# Patient Record
Sex: Male | Born: 1970
Health system: Southern US, Community
[De-identification: ages and names within clinical notes are randomized; demographics above are authoritative.]

## PROBLEM LIST (undated history)

## (undated) DIAGNOSIS — I10 Essential (primary) hypertension: Secondary | ICD-10-CM

## (undated) DIAGNOSIS — N289 Disorder of kidney and ureter, unspecified: Secondary | ICD-10-CM

## (undated) DIAGNOSIS — S83209A Unspecified tear of unspecified meniscus, current injury, unspecified knee, initial encounter: Secondary | ICD-10-CM

## (undated) DIAGNOSIS — E291 Testicular hypofunction: Secondary | ICD-10-CM

## (undated) DIAGNOSIS — J45909 Unspecified asthma, uncomplicated: Secondary | ICD-10-CM

## (undated) DIAGNOSIS — G4730 Sleep apnea, unspecified: Secondary | ICD-10-CM

## (undated) DIAGNOSIS — N209 Urinary calculus, unspecified: Secondary | ICD-10-CM

## (undated) DIAGNOSIS — E785 Hyperlipidemia, unspecified: Secondary | ICD-10-CM

## (undated) DIAGNOSIS — E669 Obesity, unspecified: Secondary | ICD-10-CM

## (undated) HISTORY — DX: Testicular hypofunction: E29.1

## (undated) HISTORY — DX: Essential (primary) hypertension: I10

## (undated) HISTORY — DX: Obesity, unspecified: E66.9

## (undated) HISTORY — DX: Sleep apnea, unspecified: G47.30

## (undated) HISTORY — PX: UPPER GASTROINTESTINAL ENDOSCOPY: SHX188

## (undated) HISTORY — DX: Hyperlipidemia, unspecified: E78.5

## (undated) HISTORY — PX: COLOSTOMY: SHX63

---

## 2004-01-03 ENCOUNTER — Emergency Department (HOSPITAL_COMMUNITY): Admission: EM | Admit: 2004-01-03 | Discharge: 2004-01-03 | Payer: Self-pay | Admitting: Emergency Medicine

## 2004-04-06 ENCOUNTER — Emergency Department (HOSPITAL_COMMUNITY): Admission: EM | Admit: 2004-04-06 | Discharge: 2004-04-06 | Payer: Self-pay | Admitting: Emergency Medicine

## 2006-02-11 ENCOUNTER — Encounter: Admission: RE | Admit: 2006-02-11 | Discharge: 2006-02-11 | Payer: Self-pay | Admitting: Family Medicine

## 2006-04-23 ENCOUNTER — Emergency Department (HOSPITAL_COMMUNITY): Admission: EM | Admit: 2006-04-23 | Discharge: 2006-04-23 | Payer: Self-pay | Admitting: Emergency Medicine

## 2006-10-06 ENCOUNTER — Ambulatory Visit: Payer: Self-pay | Admitting: Family Medicine

## 2006-10-10 ENCOUNTER — Ambulatory Visit: Payer: Self-pay | Admitting: Family Medicine

## 2006-11-10 ENCOUNTER — Ambulatory Visit: Payer: Self-pay | Admitting: Family Medicine

## 2006-11-22 ENCOUNTER — Ambulatory Visit (HOSPITAL_BASED_OUTPATIENT_CLINIC_OR_DEPARTMENT_OTHER): Admission: RE | Admit: 2006-11-22 | Discharge: 2006-11-22 | Payer: Self-pay | Admitting: Family Medicine

## 2006-11-25 ENCOUNTER — Ambulatory Visit: Payer: Self-pay | Admitting: Pulmonary Disease

## 2006-12-11 ENCOUNTER — Ambulatory Visit: Payer: Self-pay | Admitting: Family Medicine

## 2007-01-11 ENCOUNTER — Ambulatory Visit: Payer: Self-pay | Admitting: Family Medicine

## 2008-04-01 ENCOUNTER — Ambulatory Visit: Payer: Self-pay | Admitting: Family Medicine

## 2008-04-04 ENCOUNTER — Ambulatory Visit: Payer: Self-pay | Admitting: Family Medicine

## 2008-04-08 ENCOUNTER — Ambulatory Visit: Payer: Self-pay | Admitting: Family Medicine

## 2008-04-14 ENCOUNTER — Ambulatory Visit: Payer: Self-pay | Admitting: Family Medicine

## 2008-04-28 ENCOUNTER — Ambulatory Visit: Payer: Self-pay | Admitting: Family Medicine

## 2008-05-19 ENCOUNTER — Encounter: Admission: RE | Admit: 2008-05-19 | Discharge: 2008-05-19 | Payer: Self-pay | Admitting: Family Medicine

## 2008-06-12 ENCOUNTER — Ambulatory Visit: Payer: Self-pay | Admitting: Family Medicine

## 2008-06-26 ENCOUNTER — Emergency Department (HOSPITAL_COMMUNITY): Admission: EM | Admit: 2008-06-26 | Discharge: 2008-06-26 | Payer: Self-pay | Admitting: Emergency Medicine

## 2008-12-23 ENCOUNTER — Ambulatory Visit: Payer: Self-pay | Admitting: Family Medicine

## 2008-12-25 ENCOUNTER — Ambulatory Visit: Payer: Self-pay | Admitting: Family Medicine

## 2009-01-12 ENCOUNTER — Ambulatory Visit: Payer: Self-pay | Admitting: Family Medicine

## 2009-02-25 ENCOUNTER — Ambulatory Visit: Payer: Self-pay | Admitting: Family Medicine

## 2009-03-02 ENCOUNTER — Emergency Department (HOSPITAL_COMMUNITY): Admission: EM | Admit: 2009-03-02 | Discharge: 2009-03-02 | Payer: Self-pay | Admitting: Emergency Medicine

## 2009-03-03 ENCOUNTER — Ambulatory Visit: Payer: Self-pay | Admitting: Family Medicine

## 2009-03-06 ENCOUNTER — Ambulatory Visit: Payer: Self-pay | Admitting: Family Medicine

## 2009-03-12 ENCOUNTER — Ambulatory Visit: Payer: Self-pay | Admitting: Family Medicine

## 2009-03-17 ENCOUNTER — Ambulatory Visit: Payer: Self-pay | Admitting: Family Medicine

## 2009-03-23 ENCOUNTER — Ambulatory Visit: Payer: Self-pay | Admitting: Family Medicine

## 2009-06-26 ENCOUNTER — Ambulatory Visit: Payer: Self-pay | Admitting: Family Medicine

## 2009-07-29 ENCOUNTER — Ambulatory Visit: Payer: Self-pay | Admitting: Family Medicine

## 2010-07-05 ENCOUNTER — Ambulatory Visit (INDEPENDENT_AMBULATORY_CARE_PROVIDER_SITE_OTHER): Payer: 59 | Admitting: Family Medicine

## 2010-07-05 DIAGNOSIS — E785 Hyperlipidemia, unspecified: Secondary | ICD-10-CM

## 2010-07-05 DIAGNOSIS — E291 Testicular hypofunction: Secondary | ICD-10-CM

## 2010-07-05 DIAGNOSIS — E119 Type 2 diabetes mellitus without complications: Secondary | ICD-10-CM

## 2010-07-05 DIAGNOSIS — I10 Essential (primary) hypertension: Secondary | ICD-10-CM

## 2010-08-20 ENCOUNTER — Encounter: Payer: Self-pay | Admitting: Family Medicine

## 2010-08-25 ENCOUNTER — Encounter: Payer: Self-pay | Admitting: Family Medicine

## 2010-09-07 ENCOUNTER — Encounter: Payer: Self-pay | Admitting: Family Medicine

## 2010-09-07 LAB — POCT CARDIAC MARKERS: Myoglobin, poc: 99.3 ng/mL (ref 12–200)

## 2010-10-04 ENCOUNTER — Ambulatory Visit: Payer: 59 | Admitting: Family Medicine

## 2010-10-05 NOTE — Procedures (Signed)
NAME:  [name], [name]                ACCOUNT NO.:  [id number]   MEDICAL RECORD NO.:  [id number]          PATIENT TYPE:  OUT   LOCATION:  SLEEP CENTER                 FACILITY:  [facility]   PHYSICIAN:  Barbaraann Share, MD,FCCPDATE OF BIRTH:  02-Jul-1970   DATE OF STUDY:  11/22/2006                            NOCTURNAL POLYSOMNOGRAM   REFERRING PHYSICIAN:  Sharlot Gowda, M.D.   INDICATION FOR STUDY:  Hypersomnia with sleep apnea.   EPWORTH SLEEPINESS SCORE:  10   MEDICATIONS:   SLEEP ARCHITECTURE:  Patient had total sleep time of 327 minutes with  decreased slow-wave sleep, as well as REM sleep.  Onset latency was very  rapid at 2 minutes and REM onset was prolonged at 207 minutes.  Sleep  efficiency was adequate at 92%.   RESPIRATORY DATA:  Patient underwent split-night protocol, where he was  found to have 58 obstructive events in the first 125 minutes of sleep.  This gave him an apnea/hypopnea index of 28 events per hour over that  time period.  There was very loud snoring noted prior to C-PAP  initiation.  By protocol, the patient was then placed on a large Quattro  full face mask and C-PAP titration was initiated.  At a pressure of  approximately 15 cm of water, there seemed to be adequate control of his  obstructive events, although a few break-through events did occur after  this pressure.   OXYGEN DATA:  There was O2 desaturation as low as 85% with the patient's  obstructive events, prior to optimal C-PAP being reached.   CARDIAC DATA:  No clinically significant cardiac arrhythmias were noted.   MOVEMENT-PARASOMNIA:  The patient was found to have 47 leg jerks with  1.3 per hour, resulting in arousal or awakening.  This is insignificant.   IMPRESSIONS-RECOMMENDATIONS:  Split-night study reveals moderate  obstructive sleep apnea/hypopnea syndrome with an apnea/hypopnea index  of 28 events per hour and O2 desaturation as low as 85% over the first  part of the night.  Patient was  then placed on a large Quattro full face  mask and had adequate control of most of his events on a final pressure  of 15 cm of water.  There were a few break-through events on 15 and,  therefore, his pressure was titrated higher; however, I would start at  the 15cm and monitor the patient for break-through events  while at home and to see if he returns to a well-functioning baseline.  The patient should also be encouraged to work on aggressive weight-loss  while being treated with C-PAP.      Barbaraann Share, MD,FCCP  Diplomate, American Board of Sleep  Medicine  Electronically Signed     [facility]/MEDQ  D:  11/28/2006 17:04:51  T:  11/29/2006 09:54:47  Job:  [id number]

## 2011-06-27 ENCOUNTER — Other Ambulatory Visit: Payer: Self-pay | Admitting: Family Medicine

## 2011-07-26 ENCOUNTER — Ambulatory Visit (INDEPENDENT_AMBULATORY_CARE_PROVIDER_SITE_OTHER): Payer: 59 | Admitting: Family Medicine

## 2011-07-26 ENCOUNTER — Encounter: Payer: Self-pay | Admitting: Family Medicine

## 2011-07-26 DIAGNOSIS — J302 Other seasonal allergic rhinitis: Secondary | ICD-10-CM | POA: Insufficient documentation

## 2011-07-26 DIAGNOSIS — I10 Essential (primary) hypertension: Secondary | ICD-10-CM

## 2011-07-26 DIAGNOSIS — E669 Obesity, unspecified: Secondary | ICD-10-CM

## 2011-07-26 DIAGNOSIS — E785 Hyperlipidemia, unspecified: Secondary | ICD-10-CM

## 2011-07-26 DIAGNOSIS — E291 Testicular hypofunction: Secondary | ICD-10-CM | POA: Insufficient documentation

## 2011-07-26 DIAGNOSIS — Z0000 Encounter for general adult medical examination without abnormal findings: Secondary | ICD-10-CM

## 2011-07-26 DIAGNOSIS — E1169 Type 2 diabetes mellitus with other specified complication: Secondary | ICD-10-CM | POA: Insufficient documentation

## 2011-07-26 DIAGNOSIS — I152 Hypertension secondary to endocrine disorders: Secondary | ICD-10-CM | POA: Insufficient documentation

## 2011-07-26 DIAGNOSIS — E119 Type 2 diabetes mellitus without complications: Secondary | ICD-10-CM

## 2011-07-26 DIAGNOSIS — E118 Type 2 diabetes mellitus with unspecified complications: Secondary | ICD-10-CM | POA: Insufficient documentation

## 2011-07-26 DIAGNOSIS — Z23 Encounter for immunization: Secondary | ICD-10-CM

## 2011-07-26 LAB — CBC WITH DIFFERENTIAL/PLATELET
Basophils Absolute: 0 10*3/uL (ref 0.0–0.1)
Basophils Relative: 1 % (ref 0–1)
Eosinophils Absolute: 0.2 10*3/uL (ref 0.0–0.7)
MCH: 29 pg (ref 26.0–34.0)
MCHC: 33.8 g/dL (ref 30.0–36.0)
MCV: 85.9 fL (ref 78.0–100.0)
Neutro Abs: 3.6 10*3/uL (ref 1.7–7.7)
Neutrophils Relative %: 62 % (ref 43–77)
RDW: 12.9 % (ref 11.5–15.5)
WBC: 5.9 10*3/uL (ref 4.0–10.5)

## 2011-07-26 LAB — LIPID PANEL
Cholesterol: 194 mg/dL (ref 0–200)
HDL: 48 mg/dL (ref >39)
LDL Cholesterol: 132 mg/dL — ABNORMAL HIGH (ref 0–99)
Total CHOL/HDL Ratio: 4 Ratio
Triglycerides: 70 mg/dL (ref <150)
VLDL: 14 mg/dL (ref 0–40)

## 2011-07-26 LAB — COMPREHENSIVE METABOLIC PANEL
ALT: 15 U/L (ref 0–53)
CO2: 24 mEq/L (ref 19–32)
Potassium: 4.3 mEq/L (ref 3.5–5.3)
Sodium: 136 mEq/L (ref 135–145)
Total Bilirubin: 0.5 mg/dL (ref 0.3–1.2)
Total Protein: 7.3 g/dL (ref 6.0–8.3)

## 2011-07-26 MED ORDER — LOSARTAN POTASSIUM-HCTZ 100-12.5 MG PO TABS: 1.0000 | ORAL_TABLET | Freq: Every day | ORAL | Status: DC

## 2011-07-26 MED ORDER — AMLODIPINE BESYLATE 10 MG PO TABS: 10.0000 mg | ORAL_TABLET | Freq: Every day | ORAL | Status: DC

## 2011-07-26 MED ORDER — PIOGLITAZONE HCL-METFORMIN HCL 15-500 MG PO TABS: 1.0000 | ORAL_TABLET | Freq: Two times a day (BID) | ORAL | Status: DC

## 2011-07-26 MED ORDER — TESTOSTERONE 12.5 MG/ACT (1%) TD GEL: 4.0000 "application " | Freq: Two times a day (BID) | TRANSDERMAL | Status: DC

## 2011-07-26 NOTE — Progress Notes (Signed)
Subjective:    Patient ID: Guy Taylor, male    DOB: 03/13/1971, 41 y.o.   MRN: [id number]  HPI He is here for a complete examination.  Apparently the medication was causing difficulty with headaches he stopped taking his blood pressure medication. Did not call to let him know this. He also started followup on his diabetes although he states he is taking his Actos. He has not increase his physical activity stating work is kept him from doing this. He states he has made some dietary changes. He has no other concerns or complaints. His work and marriage are going well.   Review of Systems  Constitutional: Negative.   HENT: Negative.   Eyes: Negative.   Respiratory: Negative.   Cardiovascular: Negative.   Gastrointestinal: Negative.   Genitourinary: Negative.   Musculoskeletal: Negative.   Skin: Negative.   Neurological: Negative.   Hematological: Negative.   Psychiatric/Behavioral: Negative.        Objective:   Physical Exam BP 204/124  Pulse 81  Wt 376 lb (170.552 kg)  General Appearance:    Alert, cooperative, no distress, appears stated age  Head:    Normocephalic, without obvious abnormality, atraumatic  Eyes:    PERRL, conjunctiva/corneas clear, EOM's intact, fundi    benign  Ears:    Normal TM's and external ear canals  Nose:   Nares normal, mucosa normal, no drainage or sinus   tenderness  Throat:   Lips, mucosa, and tongue normal; teeth and gums normal  Neck:   Supple, no lymphadenopathy;  thyroid:  no   enlargement/tenderness/nodules; no carotid   bruit or JVD  Back:    Spine nontender, no curvature, ROM normal, no CVA     tenderness  Lungs:     Clear to auscultation bilaterally without wheezes, rales or     ronchi; respirations unlabored  Chest Wall:    No tenderness or deformity   Heart:    Regular rate and rhythm, S1 and S2 normal, no murmur, rub   or gallop  Breast Exam:    No chest wall tenderness, masses or gynecomastia  Abdomen:     Soft, non-tender,  nondistended, normoactive bowel sounds,    no masses, no hepatosplenomegaly  Genitalia:    Normal male external genitalia without lesions.  Testicles without masses.  No inguinal hernias.  Rectal:    Normal sphincter tone, no masses or tenderness; guaiac negative stool.  Prostate smooth, no nodules, not enlarged.  Extremities:   No clubbing, cyanosis or edema  Pulses:   2+ and symmetric all extremities  Skin:   Skin color, texture, turgor normal, no rashes or lesions  Lymph nodes:   Cervical, supraclavicular, and axillary nodes normal  Neurologic:   CNII-XII intact, normal strength, sensation and gait; reflexes 2+ and symmetric throughout          Psych:   Normal mood, affect, hygiene and grooming.           Assessment & Plan:   1. Diabetes mellitus  Amb ref to Medical Nutrition Therapy-MNT, pioglitazone-metformin (ACTOPLUS MET) 15-500 MG per tablet, CBC with Differential, Comprehensive metabolic panel, POCT UA - Microalbumin, Lipid panel  2. Accelerated hypertension  PR ELECTROCARDIOGRAM, COMPLETE, losartan-hydrochlorothiazide (HYZAAR) 100-12.5 MG per tablet, amLODipine (NORVASC) 10 MG tablet, CBC with Differential, Comprehensive metabolic panel  3. Obesity (BMI 35.0-39.9 without comorbidity)  CBC with Differential, Comprehensive metabolic panel  4. Hyperlipidemia LDL goal <70  Lipid panel  5. Hypogonadism male  Testosterone (ANDROGEL PUMP) 1.25  GM/ACT (1%) GEL, PSA  6. Allergic rhinitis, seasonal    7. Routine general medical examination at a health care facility  Tdap vaccine greater than or equal to 7yo IM, Hemoccult - 1 Card (office)  Start back on all your medications. Recheck your blood pressure here in one week. Discussed the need for him that diet and exercise changes. Also discussed the need for him to call me if he has difficulty with his medications rather than just stop them. He is to return here in one week for recheck.

## 2011-07-26 NOTE — Patient Instructions (Signed)
Start back on all your medications. Recheck your blood pressure here in one week.

## 2011-07-27 NOTE — Progress Notes (Signed)
Quick Note:  The blood work is normal ______ 

## 2011-08-03 ENCOUNTER — Other Ambulatory Visit: Payer: 59

## 2011-09-13 ENCOUNTER — Encounter: Payer: Self-pay | Admitting: *Deleted

## 2011-09-13 ENCOUNTER — Encounter: Payer: 59 | Attending: Family Medicine | Admitting: *Deleted

## 2011-09-13 DIAGNOSIS — Z713 Dietary counseling and surveillance: Secondary | ICD-10-CM | POA: Insufficient documentation

## 2011-09-13 DIAGNOSIS — E669 Obesity, unspecified: Secondary | ICD-10-CM | POA: Insufficient documentation

## 2011-09-13 DIAGNOSIS — E119 Type 2 diabetes mellitus without complications: Secondary | ICD-10-CM | POA: Insufficient documentation

## 2011-09-13 NOTE — Patient Instructions (Addendum)
Plan: Aim for 4 Carb choices (60 grams) per meal +/- 1 either way with current activity level Aim for 5  Carb choices (75 grams) per meal +/- 1 either way with increased activity level Investigate options for joining a basket ball team that practices and plays at least 3 times a week

## 2011-09-13 NOTE — Progress Notes (Signed)
Medical Nutrition Therapy:  Appt start time: 0900 end time:  1000.  Assessment:  Primary concerns today: patient here for assistance with weight loss and for diabetes education. He states history of diabetes for 4 years and no previous diabetes education. He was physically active in college with football and basketball, but is not currently playing either. 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    MEDICATIONS: see list. Diabetes medication is (ACTOPLUS MET) 15-500 MG per tablet   DIETARY INTAKE:  Usual eating pattern includes 2-3 meals and 2 snacks per day.  Everyday foods include fair variety of all food groups.  Avoided foods include regular soda and sweets.    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   Nutritional Diagnosis:  NI-1.5 Excessive energy intake As related to activity level.  As evidenced by BMI of 45.9 % .    Intervention:  Nutrition counseling and diabetes education provided. Discusses basic physiology of diabetes, self-monitoring of BG, HgA1c, Carb Counting and calorie value of macro-nutrients for weight loss success. Also encouraged increased activity through sports he enjoys including basketball.  Plan: Aim for 4 Carb choices (60 grams) per meal +/- 1 either way with current activity  level Aim for 5  Carb choices (75 grams) per meal +/- 1 either way with increased activity level Investigate options for joining a basket ball team that practices and plays at least 3 times a week  Handouts given during visit include: Living Well with Diabetes Carb Counting and Food Label handouts Meal Plan Card  Monitoring/Evaluation:  Dietary intake, exercise, self monitoring of BG, and body weight in 6 week(s).

## 2011-10-25 ENCOUNTER — Ambulatory Visit: Payer: 59 | Admitting: *Deleted

## 2011-10-31 ENCOUNTER — Ambulatory Visit: Payer: 59 | Admitting: *Deleted

## 2011-11-07 ENCOUNTER — Other Ambulatory Visit: Payer: Self-pay

## 2011-11-07 ENCOUNTER — Ambulatory Visit (INDEPENDENT_AMBULATORY_CARE_PROVIDER_SITE_OTHER): Payer: 59 | Admitting: Family Medicine

## 2011-11-07 ENCOUNTER — Encounter: Payer: Self-pay | Admitting: Family Medicine

## 2011-11-07 VITALS — BP 170/110 | HR 78 | Wt 387.0 lb

## 2011-11-07 DIAGNOSIS — M79609 Pain in unspecified limb: Secondary | ICD-10-CM

## 2011-11-07 DIAGNOSIS — E291 Testicular hypofunction: Secondary | ICD-10-CM

## 2011-11-07 DIAGNOSIS — M79672 Pain in left foot: Secondary | ICD-10-CM

## 2011-11-07 MED ORDER — TESTOSTERONE 12.5 MG/ACT (1%) TD GEL: 4.0000 "application " | Freq: Two times a day (BID) | TRANSDERMAL | Status: DC

## 2011-11-07 NOTE — Progress Notes (Signed)
  Subjective:    Patient ID: Guy Taylor, male    DOB: 1971/01/31, 41 y.o.   MRN: [id number]  HPI A week ago while playing basketball, someone landed on his left foot causing pain mainly in the fourth toe. She continues to have pain. He also states that he ran out of his AndroGel.  Review of Systems     Objective:   Physical Exam Exam of the left foot shows no swelling or deformity with minimal tenderness to palpation over the fourth MTP joint. Good motion of the joint. No other palpable tenderness. X-ray is negative      Assessment & Plan:  Left foot contusion. Supportive care Hypogonadism. His testosterone will be renewed and he will return here in one month for a recheck .

## 2011-11-07 NOTE — Telephone Encounter (Signed)
Called testosterone in per jcl 

## 2011-11-22 ENCOUNTER — Encounter: Payer: 59 | Attending: Family Medicine | Admitting: *Deleted

## 2011-11-22 DIAGNOSIS — Z713 Dietary counseling and surveillance: Secondary | ICD-10-CM | POA: Insufficient documentation

## 2011-11-22 DIAGNOSIS — E669 Obesity, unspecified: Secondary | ICD-10-CM

## 2011-11-22 DIAGNOSIS — E119 Type 2 diabetes mellitus without complications: Secondary | ICD-10-CM | POA: Insufficient documentation

## 2011-11-22 NOTE — Progress Notes (Signed)
Medical Nutrition Therapy:  Appt start time: 0900 end time:  1000.  Assessment:  Primary concerns today: patient here for follow up visit for diabetes and weight loss. He traveled to [place] to be with family since last visit and ate more than usual, exercised less. Weight today indicates stability, no gain or loss since last visit. He has started playing basket ball with friends and anticipates his work hours to decrease in another month. He is testing his BG BID and reports range of 89-154 mg/dl. Feels comfortable with Carb Counting and states he is aiming for 4 Carb Choices per meal.  MEDICATIONS: see list. Diabetes medication is (ACTOPLUS MET) 15-500 MG per tablet   DIETARY INTAKE:  Usual eating pattern includes 2-3 meals and 2 snacks per day.  Everyday foods include fair variety of all food groups.  Avoided foods include regular soda and sweets.    24-hr recall:  B ( AM): on work days: sausage biscuit x 1, water, otherwise no breakfast  Snk ( AM): crackers x 2 pkgs from vending machine  L ( PM): cafeteria at work: 1 wrap, chips, water Snk ( PM): same as AM D ( PM): home: wife cooks - meat x 2-3 pieces, vegetables, occasionally bread, diluted fruit juice x 8 oz Snk ( PM): none Beverages: water, diluted fruit juice, occasional regular [employer] (2/month)  Usual physical activity: has started playing some basketball and currently tries to walk briskly 2 miles on days off as well as at work  Estimated energy needs: 1800 calories 200 g carbohydrates 135 g protein 50 g fat  Progress Towards Goal(s):  In progress.   Nutritional Diagnosis:  NI-1.5 Excessive energy intake As related to activity level.  As evidenced by BMI of 45.9 % .    Intervention: Complemented him on his successes so far, encouraged him to continue with healthier food choices and increase his activity level.  Plan: Continue to aim for 4 Carb choices (60 grams) per meal +/- 1 either way with current activity  level Continue to aim for 5  Carb choices (75 grams) per meal +/- 1 either way with increased activity level Continue to play basket ball and playing at least 2-3 times a week Continue to test your BG twice daily as directed by your MD  Monitoring/Evaluation:  Dietary intake, exercise, self monitoring of BG, and body weight PRN.

## 2011-11-28 ENCOUNTER — Encounter: Payer: Self-pay | Admitting: *Deleted

## 2011-11-28 NOTE — Patient Instructions (Signed)
Plan: Continue to aim for 4 Carb choices (60 grams) per meal +/- 1 either way with current activity level Continue to aim for 5  Carb choices (75 grams) per meal +/- 1 either way with increased activity level Continue to play basket ball and playing at least 2-3 times a week Continue to test your BG twice daily as directed by your MD

## 2011-12-12 ENCOUNTER — Telehealth: Payer: Self-pay | Admitting: Internal Medicine

## 2011-12-12 ENCOUNTER — Other Ambulatory Visit: Payer: Self-pay

## 2011-12-12 ENCOUNTER — Encounter: Payer: Self-pay | Admitting: Family Medicine

## 2011-12-12 ENCOUNTER — Ambulatory Visit (INDEPENDENT_AMBULATORY_CARE_PROVIDER_SITE_OTHER): Payer: 59 | Admitting: Family Medicine

## 2011-12-12 VITALS — BP 140/90 | HR 79 | Wt 392.0 lb

## 2011-12-12 DIAGNOSIS — N529 Male erectile dysfunction, unspecified: Secondary | ICD-10-CM

## 2011-12-12 DIAGNOSIS — E291 Testicular hypofunction: Secondary | ICD-10-CM

## 2011-12-12 DIAGNOSIS — E669 Obesity, unspecified: Secondary | ICD-10-CM

## 2011-12-12 DIAGNOSIS — E119 Type 2 diabetes mellitus without complications: Secondary | ICD-10-CM

## 2011-12-12 MED ORDER — TESTOSTERONE 20.25 MG/1.25GM (1.62%) TD GEL: 4.0000 | Freq: Every day | TRANSDERMAL | Status: DC

## 2011-12-12 MED ORDER — TADALAFIL 20 MG PO TABS: 20.0000 mg | ORAL_TABLET | Freq: Every day | ORAL | Status: DC | PRN

## 2011-12-12 NOTE — Progress Notes (Signed)
  Subjective:    Patient ID: Guy Taylor, male    DOB: [birth year], 41 y.o.   MRN: [id number]  HPI He is here for a checkup. He states that his foot is now back to normal. He did not get his testosterone filled. He states that the pharmacy said that it did not have the prescription. He states that he is checking his blood sugars and they are now running under 120. He has been checking his blood pressure and this is also been good. He works 6 days a week and apparently is quite active at work with walking. In spite of this his weight has gone up. He also is had some difficulty with erectile dysfunction. He has tried Cialis in the past with mixed results.   Review of Systems     Objective:   Physical Exam Alert and in no distress otherwise not examined.       Assessment & Plan:   1. Diabetes mellitus    2. Hypogonadism male    3. Obesity (BMI 35.0-39.9 without comorbidity)    4. ED (erectile dysfunction)  tadalafil (CIALIS) 20 MG tablet   I encouraged him to look at his eating habits and instead of eating only twice per day, increases to more frequent smaller meals. Also encouraged him to increase his physical activity. I will renew his testosterone. Also given Cialis. I would like to see if the testosterone has an additive effect on his erectile issue. 25 minutes spent discussing all these issues with him.

## 2011-12-12 NOTE — Telephone Encounter (Signed)
Called pt and asked him to call his ins to see what was covered and let us know

## 2011-12-12 NOTE — Telephone Encounter (Signed)
Called androgel in per jcl 

## 2011-12-12 NOTE — Telephone Encounter (Signed)
Find out what is covered by his insurance and called it in to have him return for followup in one month

## 2011-12-12 NOTE — Patient Instructions (Signed)
Let me know how the Cialis works. Check with your pharmacy later today and if they do not have the testosterone, stay on top of this and to let it slide another month

## 2011-12-17 ENCOUNTER — Emergency Department (HOSPITAL_COMMUNITY)
Admission: EM | Admit: 2011-12-17 | Discharge: 2011-12-17 | Disposition: A | Discharge status: Home / Self Care | Payer: 59 | Source: Home / Self Care | Attending: Emergency Medicine | Admitting: Emergency Medicine

## 2011-12-17 ENCOUNTER — Encounter (HOSPITAL_COMMUNITY): Payer: Self-pay | Admitting: Cardiology

## 2011-12-17 DIAGNOSIS — M79672 Pain in left foot: Secondary | ICD-10-CM

## 2011-12-17 DIAGNOSIS — M79609 Pain in unspecified limb: Secondary | ICD-10-CM

## 2011-12-17 HISTORY — DX: Unspecified asthma, uncomplicated: J45.909

## 2011-12-17 MED ORDER — IBUPROFEN 800 MG PO TABS: 800.0000 mg | ORAL_TABLET | Freq: Three times a day (TID) | ORAL | Status: AC

## 2011-12-17 NOTE — ED Provider Notes (Signed)
History     CSN: [id number]  Arrival date & time 12/17/11  1228   First MD Initiated Contact with Patient 12/17/11 1241      Chief Complaint  Patient presents with  . 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 iT HURTS SPECIFICALLY IN HIS LEFT HEEL area when he walks on it. Denies any skin disruption, numbness or tingling sensation or weakness.  Patient is a 41 y.o. male presenting with leg pain. The history is provided by the patient.  Leg Pain  The incident occurred 1 to 2 hours ago. The incident occurred at work. There was no injury mechanism. Pain location: Left heel. The pain is at a severity of 4/10. Pertinent negatives include no numbness, no muscle weakness, no loss of sensation and no tingling.    Past Medical History  Diagnosis Date  . Hypertension   . Obesity   . Hypogonadism male   . Allergic rhinitis   . Diabetes mellitus   . Asthma     History reviewed. No pertinent past surgical history.  Family History  Problem Relation Age of Onset  . Diabetes Mother     History  Substance Use Topics  . Smoking status: Never Smoker   . Smokeless tobacco: Never Used  . Alcohol Use: No      Review of Systems  Constitutional: Positive for activity change and appetite change. Negative for fever and fatigue.  Skin: Negative for color change, pallor, rash and wound.  Neurological: Negative for tingling, weakness and numbness.    Allergies  Lisinopril  Home Medications   Current Outpatient Rx  Name Route Sig Dispense Refill  . AMLODIPINE BESYLATE 10 MG PO TABS Oral Take 1 tablet (10 mg total) by mouth daily. 30 tablet 11  . LOSARTAN POTASSIUM-HCTZ 100-12.5 MG PO TABS Oral Take 1 tablet by mouth daily. 90  tablet 3  . PIOGLITAZONE HCL-METFORMIN HCL 15-500 MG PO TABS Oral Take 1 tablet by mouth 2 (two) times daily with a meal. 60 tablet 5    Pt must have diabetes check  . ROSUVASTATIN CALCIUM 20 MG PO TABS Oral Take 20 mg by mouth daily.      . TESTOSTERONE 20.25 MG/1.25GM (1.62%) TD GEL Transdermal Place 4 Squirts onto the skin daily. 150 g 5  . IBUPROFEN 800 MG PO TABS Oral Take 1 tablet (800 mg total) by mouth 3 (three) times daily. 21 tablet 0  . TADALAFIL 20 MG PO TABS Oral Take 1 tablet (20 mg total) by mouth daily as needed for erectile dysfunction. 6 tablet 11    BP 138/88  Pulse 87  Temp 97.9 F (36.6 C) (Oral)  Resp 16  SpO2 97%  Physical Exam  Nursing note and vitals reviewed. Constitutional: He appears well-developed. No distress.  Musculoskeletal: He exhibits tenderness. He exhibits no edema.       Left ankle: He exhibits normal range of motion, no swelling, no ecchymosis, no deformity, no laceration and normal pulse. tenderness. No lateral malleolus and no medial malleolus tenderness found. Achilles tendon normal.       Feet:  Neurological: He is alert. He exhibits normal muscle tone.  Skin: No rash noted. No erythema.    ED Course  Procedures (including critical care time)  Labs Reviewed -  No data to display No results found.   1. Pain of left heel       MDM   Possibly focal fasciitis in the area of his left heel. Nontraumatic. Patient instructed to use a shoe insert and he is an NSAID course as necessary.       Jimmie Molly, MD 12/17/11 [phone]

## 2011-12-17 NOTE — ED Notes (Signed)
Pt reports he was at work today and at approx 10am had pain shoot from heel through the back of his calf. Denies injury. Pain is gone when bearing weight on the foot. Denies calf tenderness. Denies fever.

## 2011-12-20 ENCOUNTER — Other Ambulatory Visit: Payer: Self-pay

## 2011-12-20 NOTE — Telephone Encounter (Signed)
Pt was asked to call ins. Company to find out what testosterone med they will cover he called me today left message I couldn't understand something about generic to please call that in I called left message for pt I didn't understand the message to please call me back and let me know the generic name

## 2011-12-28 ENCOUNTER — Other Ambulatory Visit: Payer: Self-pay

## 2011-12-28 MED ORDER — TESTOSTERONE 50 MG/5GM (1%) TD GEL: 5.0000 g | Freq: Every day | TRANSDERMAL | Status: DC

## 2011-12-28 NOTE — Telephone Encounter (Signed)
Pt called said the testosterone med his ins will pay for is anoderm or,testim please advise on what you want called in

## 2011-12-28 NOTE — Telephone Encounter (Signed)
Called testim in 1 tube qd recheck in 1 mth left message on cell # that testim was called in and discount card up front

## 2011-12-28 NOTE — Telephone Encounter (Signed)
Call in Testim one tube per day and recheck here one month.

## 2012-01-02 ENCOUNTER — Encounter: Payer: Self-pay | Admitting: Family Medicine

## 2012-01-02 ENCOUNTER — Ambulatory Visit (INDEPENDENT_AMBULATORY_CARE_PROVIDER_SITE_OTHER): Payer: 59 | Admitting: Family Medicine

## 2012-01-02 VITALS — BP 140/90 | HR 80

## 2012-01-02 DIAGNOSIS — M7050 Other bursitis of knee, unspecified knee: Secondary | ICD-10-CM

## 2012-01-02 DIAGNOSIS — IMO0002 Reserved for concepts with insufficient information to code with codable children: Secondary | ICD-10-CM

## 2012-01-02 NOTE — Patient Instructions (Signed)
Heat for 20 minutes 3 times per day. Use Advil or Aleve regularly for the next week or so to help with pain if you need

## 2012-01-02 NOTE — Progress Notes (Signed)
  Subjective:    Patient ID: Guy Taylor, male    DOB: [birth year], 41 y.o.   MRN: [id number]  HPI He has a two-week history of right knee aching. In the last the pain has gotten much worse and is localized to the medial aspect of his knee. No history of recent injury, popping, locking or grinding.  Review of Systems     Objective:   Physical Exam Exam of the right knee shows tenderness palpation over the pes anserine bursa. No joint effusion noted. Ligaments intact. No joint line tenderness.       Assessment & Plan:   1. Pes anserine bursitis    recommend heat, anti-inflammatory and conservative care. I reassured him that this was not dangerous.

## 2012-01-16 ENCOUNTER — Other Ambulatory Visit: Payer: Self-pay

## 2012-01-16 ENCOUNTER — Encounter: Payer: Self-pay | Admitting: Family Medicine

## 2012-01-16 ENCOUNTER — Ambulatory Visit (INDEPENDENT_AMBULATORY_CARE_PROVIDER_SITE_OTHER): Payer: 59 | Admitting: Family Medicine

## 2012-01-16 VITALS — BP 160/100 | HR 80 | Wt 392.0 lb

## 2012-01-16 DIAGNOSIS — M25561 Pain in right knee: Secondary | ICD-10-CM

## 2012-01-16 DIAGNOSIS — M25569 Pain in unspecified knee: Secondary | ICD-10-CM

## 2012-01-16 DIAGNOSIS — R52 Pain, unspecified: Secondary | ICD-10-CM

## 2012-01-16 NOTE — Progress Notes (Signed)
  Subjective:    Patient ID: Guy Taylor, male    DOB: Aug 01, 1970, 41 y.o.   MRN: [id number]  HPI He is here for recheck. He has had knee pain now for one month. He now is having some lateral knee pain as well as medial knee pain in the same area as the last visit. He also complains of popping sensation but no grinding or locking. He has been on anti-inflammatories for the last several weeks.   Review of Systems     Objective:   Physical Exam Pain on motion of the knee. No effusion noted. Tender to palpation along the lateral joint line with positive McMurray's. Anterior drawer caused discomfort. Tender to palpation over the pes anserine bursa.       Assessment & Plan:   1. Right knee pain  MR Knee Left  Wo Contrast   case had to be discussed with physician from [place] [employer] to get approval

## 2012-01-20 ENCOUNTER — Ambulatory Visit
Admission: RE | Admit: 2012-01-20 | Discharge: 2012-01-20 | Disposition: A | Discharge status: Home / Self Care | Payer: 59 | Source: Ambulatory Visit | Attending: Family Medicine | Admitting: Family Medicine

## 2012-01-20 DIAGNOSIS — R52 Pain, unspecified: Secondary | ICD-10-CM

## 2012-01-24 NOTE — Progress Notes (Signed)
Faxed all info over to [facility]

## 2012-01-24 NOTE — Progress Notes (Signed)
LEFT MESSAGE FOR PT TO CALL ME BACK TO SEE WHAT ORTHO HE WOULD WANT TO SEE

## 2012-02-01 ENCOUNTER — Other Ambulatory Visit: Payer: Self-pay | Admitting: Orthopedic Surgery

## 2012-02-06 ENCOUNTER — Encounter (HOSPITAL_COMMUNITY): Payer: Self-pay

## 2012-02-07 ENCOUNTER — Encounter (HOSPITAL_COMMUNITY)
Admission: RE | Admit: 2012-02-07 | Discharge: 2012-02-07 | Disposition: A | Discharge status: Home / Self Care | Payer: 59 | Source: Ambulatory Visit | Attending: Orthopedic Surgery | Admitting: Orthopedic Surgery

## 2012-02-07 ENCOUNTER — Encounter (HOSPITAL_COMMUNITY): Payer: Self-pay

## 2012-02-07 HISTORY — DX: Unspecified tear of unspecified meniscus, current injury, unspecified knee, initial encounter: S83.209A

## 2012-02-07 HISTORY — DX: Urinary calculus, unspecified: N20.9

## 2012-02-07 LAB — CBC
Hemoglobin: 15.4 g/dL (ref 13.0–17.0)
MCH: 29.1 pg (ref 26.0–34.0)
MCHC: 33.8 g/dL (ref 30.0–36.0)
MCV: 85.8 fL (ref 78.0–100.0)
RBC: 5.3 MIL/uL (ref 4.22–5.81)

## 2012-02-07 LAB — BASIC METABOLIC PANEL
BUN: 16 mg/dL (ref 6–23)
CO2: 26 mEq/L (ref 19–32)
Calcium: 9.8 mg/dL (ref 8.4–10.5)
GFR calc non Af Amer: 72 mL/min — ABNORMAL LOW (ref >90)
Glucose, Bld: 149 mg/dL — ABNORMAL HIGH (ref 70–99)
Potassium: 3.9 mEq/L (ref 3.5–5.1)

## 2012-02-07 NOTE — Consult Note (Signed)
Anesthesia consult: Patient is a 41 year old male scheduled for right knee arthroscopy on 02/13/2012. History includes morbid obesity, diabetes mellitus type 2, hypertension, asthma, hypogonadism, non-smoker.  PCP is Dr. Sharlot Gowda.  I was asked to speak with Guy Taylor today due to elevated BP 180-190/120.  He did not take his anti-hypertensive medications this morning (Hyzaar, Norvasc).  He was also having pain in his right knee.  His BP in the ED on 12/17/11 was 138/88, 140/90 on 12/12/11 (PCP) ,but was up to 170/110 on 11/07/11.  He denied headaches, chest pain, SOB.  He did not have any significant activity restrictions until his knee began hurting approximately three months ago.  He reports that his DM is well controlled.  Exam shows his heart with a RRR, no murmur noted, lungs clear.  Trace LE edema.  Neuro exam is grossly intact.  EKG from 02/07/12 showed NSR.  CXR from 02/07/12 showed no acute abnormalities.  Labs show a Cr 1.23.  Glucose 149.  CBC WNL.  Patient was going home following his PAT visit to take his BP medications.  He has a BP cuff at home, and was instructed to notify Dr. Susann Givens or EMS later today if his BP remained significantly elevated or if he developed symptoms such as headache or chest pain.  I also instructed him to follow-up with Dr. Jola Babinski office to see if he could come in before the day of surgery for a BP check and potential appointment if his BP remained elevated.  He and Darl Pikes at Dr. Darene Lamer office are aware that his case will likely be canceled if he presents with a significantly elevated BP on the day of surgery.  I did instruct him to take both his Hyzaar and Norvasc on the morning of surgery.  Darl Pikes will call to follow-up with Mr. Evard regarding his HTN follow-up.    Shonna Chock, PA-C

## 2012-02-07 NOTE — Progress Notes (Signed)
Guy Taylor to talk with patient re: elevated bp

## 2012-02-07 NOTE — Pre-Procedure Instructions (Addendum)
20 SIMS LADAY  02/07/2012   Your procedure is scheduled on:  02/13/12  Report to [facility] at 1040 AM.  Call this number if you have problems the morning of surgery: [phone]   Remember:   Do not eat food or drink:After Midnight.    Take these medicines the morning of surgery with A SIP OF WATER: amlodipine, testosterone if taken in am   Do not wear jewelry, make-up or nail polish.  Do not wear lotions, powders, or perfumes. You may wear deodorant.  Do not shave 48 hours prior to surgery. Men may shave face and neck.  Do not bring valuables to the hospital.  Contacts, dentures or bridgework may not be worn into surgery.  Leave suitcase in the car. After surgery it may be brought to your room.  For patients admitted to the hospital, checkout time is 11:00 AM the day of discharge.   Patients discharged the day of surgery will not be allowed to drive home.  Name and phone number of your driver: tiffany wife [phone]  Special Instructions: Incentive Spirometry - Practice and bring it with you on the day of surgery. and CHG Shower Use Special Wash: 1/2 bottle night before surgery and 1/2 bottle morning of surgery.   Please read over the following fact sheets that you were given: Pain Booklet, Coughing and Deep Breathing, MRSA Information and Surgical Site Infection Prevention

## 2012-02-12 MED ORDER — DEXTROSE 5 % IV SOLN
3.0000 g | INTRAVENOUS | Status: AC
  Administered 2012-02-13: 3 g via INTRAVENOUS
  Filled 2012-02-12: qty 3000

## 2012-02-13 ENCOUNTER — Ambulatory Visit (HOSPITAL_COMMUNITY)
Admission: RE | Admit: 2012-02-13 | Discharge: 2012-02-13 | Disposition: A | Discharge status: Home / Self Care | Payer: 59 | Source: Ambulatory Visit | Attending: Orthopedic Surgery | Admitting: Orthopedic Surgery

## 2012-02-13 ENCOUNTER — Encounter (HOSPITAL_COMMUNITY)
Admission: RE | Disposition: A | Discharge status: Home / Self Care | Payer: Self-pay | Source: Ambulatory Visit | Attending: Orthopedic Surgery

## 2012-02-13 ENCOUNTER — Encounter (HOSPITAL_COMMUNITY): Payer: Self-pay | Admitting: Vascular Surgery

## 2012-02-13 ENCOUNTER — Encounter (HOSPITAL_COMMUNITY): Payer: Self-pay | Admitting: *Deleted

## 2012-02-13 ENCOUNTER — Ambulatory Visit (HOSPITAL_COMMUNITY): Payer: 59 | Admitting: Vascular Surgery

## 2012-02-13 DIAGNOSIS — M23329 Other meniscus derangements, posterior horn of medial meniscus, unspecified knee: Secondary | ICD-10-CM | POA: Insufficient documentation

## 2012-02-13 DIAGNOSIS — E119 Type 2 diabetes mellitus without complications: Secondary | ICD-10-CM | POA: Insufficient documentation

## 2012-02-13 DIAGNOSIS — Z01810 Encounter for preprocedural cardiovascular examination: Secondary | ICD-10-CM | POA: Insufficient documentation

## 2012-02-13 DIAGNOSIS — Z01818 Encounter for other preprocedural examination: Secondary | ICD-10-CM | POA: Insufficient documentation

## 2012-02-13 DIAGNOSIS — M2240 Chondromalacia patellae, unspecified knee: Secondary | ICD-10-CM | POA: Insufficient documentation

## 2012-02-13 DIAGNOSIS — Z01812 Encounter for preprocedural laboratory examination: Secondary | ICD-10-CM | POA: Insufficient documentation

## 2012-02-13 DIAGNOSIS — I10 Essential (primary) hypertension: Secondary | ICD-10-CM | POA: Insufficient documentation

## 2012-02-13 HISTORY — PX: KNEE ARTHROSCOPY: SHX127

## 2012-02-13 LAB — GLUCOSE, CAPILLARY
Glucose-Capillary: 157 mg/dL — ABNORMAL HIGH (ref 70–99)
Glucose-Capillary: 124 mg/dL — ABNORMAL HIGH (ref 70–99)

## 2012-02-13 SURGERY — ARTHROSCOPY, KNEE
Anesthesia: General | Site: Knee | Laterality: Right | Wound class: Clean

## 2012-02-13 MED ORDER — LABETALOL HCL 5 MG/ML IV SOLN
INTRAVENOUS | Status: AC
  Filled 2012-02-13: qty 4

## 2012-02-13 MED ORDER — LIDOCAINE HCL (CARDIAC) 20 MG/ML IV SOLN
INTRAVENOUS | Status: DC | PRN
  Administered 2012-02-13: 100 mg via INTRAVENOUS

## 2012-02-13 MED ORDER — HYDROMORPHONE HCL PF 1 MG/ML IJ SOLN
INTRAMUSCULAR | Status: AC
  Filled 2012-02-13: qty 1

## 2012-02-13 MED ORDER — BUPIVACAINE HCL (PF) 0.5 % IJ SOLN
INTRAMUSCULAR | Status: AC
  Filled 2012-02-13: qty 30

## 2012-02-13 MED ORDER — EPINEPHRINE HCL 1 MG/ML IJ SOLN
INTRAMUSCULAR | Status: DC | PRN
  Administered 2012-02-13: 2 mL

## 2012-02-13 MED ORDER — BUPIVACAINE HCL (PF) 0.25 % IJ SOLN
INTRAMUSCULAR | Status: AC
  Filled 2012-02-13: qty 30

## 2012-02-13 MED ORDER — OXYCODONE HCL 5 MG/5ML PO SOLN: 5.0000 mg | Freq: Once | ORAL | Status: DC | PRN

## 2012-02-13 MED ORDER — EPINEPHRINE HCL 1 MG/ML IJ SOLN
INTRAMUSCULAR | Status: AC
  Filled 2012-02-13: qty 2

## 2012-02-13 MED ORDER — OXYCODONE-ACETAMINOPHEN 5-325 MG PO TABS
1.0000 | ORAL_TABLET | Freq: Four times a day (QID) | ORAL | Status: DC | PRN
Start: 2012-02-13 — End: 2012-11-12

## 2012-02-13 MED ORDER — BUPIVACAINE HCL 0.5 % IJ SOLN
INTRAMUSCULAR | Status: DC | PRN
  Administered 2012-02-13: 20 mL

## 2012-02-13 MED ORDER — DROPERIDOL 2.5 MG/ML IJ SOLN: 0.6250 mg | INTRAMUSCULAR | Status: DC | PRN

## 2012-02-13 MED ORDER — ACETAMINOPHEN 10 MG/ML IV SOLN
INTRAVENOUS | Status: AC
  Filled 2012-02-13: qty 100

## 2012-02-13 MED ORDER — ONDANSETRON HCL 4 MG/2ML IJ SOLN
INTRAMUSCULAR | Status: DC | PRN
  Administered 2012-02-13: 4 mg via INTRAVENOUS

## 2012-02-13 MED ORDER — OXYCODONE HCL 5 MG PO TABS: 5.0000 mg | ORAL_TABLET | Freq: Once | ORAL | Status: DC | PRN

## 2012-02-13 MED ORDER — ACETAMINOPHEN 10 MG/ML IV SOLN
INTRAVENOUS | Status: DC | PRN
  Administered 2012-02-13: 1000 mg via INTRAVENOUS

## 2012-02-13 MED ORDER — MIDAZOLAM HCL 5 MG/5ML IJ SOLN
INTRAMUSCULAR | Status: DC | PRN
  Administered 2012-02-13: 2 mg via INTRAVENOUS

## 2012-02-13 MED ORDER — LACTATED RINGERS IV SOLN
INTRAVENOUS | Status: DC | PRN
  Administered 2012-02-13: 13:00:00 via INTRAVENOUS

## 2012-02-13 MED ORDER — LABETALOL HCL 5 MG/ML IV SOLN
INTRAVENOUS | Status: DC | PRN
  Administered 2012-02-13 (×4): 5 mg via INTRAVENOUS

## 2012-02-13 MED ORDER — POVIDONE-IODINE 7.5 % EX SOLN
Freq: Once | CUTANEOUS | Status: DC
  Filled 2012-02-13: qty 118

## 2012-02-13 MED ORDER — HYDROMORPHONE HCL PF 1 MG/ML IJ SOLN
0.2500 mg | INTRAMUSCULAR | Status: DC | PRN
  Administered 2012-02-13 (×2): 0.5 mg via INTRAVENOUS

## 2012-02-13 MED ORDER — SODIUM CHLORIDE 0.9 % IR SOLN
Status: DC | PRN
  Administered 2012-02-13: 6000 mL

## 2012-02-13 MED ORDER — PROPOFOL 10 MG/ML IV BOLUS
INTRAVENOUS | Status: DC | PRN
Start: 2012-02-13 — End: 2012-02-13
  Administered 2012-02-13: 300 mg via INTRAVENOUS

## 2012-02-13 MED ORDER — SUCCINYLCHOLINE CHLORIDE 20 MG/ML IJ SOLN
INTRAMUSCULAR | Status: DC | PRN
  Administered 2012-02-13: 100 mg via INTRAVENOUS

## 2012-02-13 MED ORDER — LACTATED RINGERS IV SOLN
INTRAVENOUS | Status: DC
  Administered 2012-02-13: 12:00:00 via INTRAVENOUS

## 2012-02-13 MED ORDER — FENTANYL CITRATE 0.05 MG/ML IJ SOLN
INTRAMUSCULAR | Status: DC | PRN
  Administered 2012-02-13: 100 ug via INTRAVENOUS
  Administered 2012-02-13: 50 ug via INTRAVENOUS
  Administered 2012-02-13: 100 ug via INTRAVENOUS

## 2012-02-13 SURGICAL SUPPLY — 37 items
BANDAGE ELASTIC 6 VELCRO ST LF (GAUZE/BANDAGES/DRESSINGS) ×2 IMPLANT
BANDAGE GAUZE ELAST BULKY 4 IN (GAUZE/BANDAGES/DRESSINGS) ×2 IMPLANT
BLADE CUDA 5.5 (BLADE) IMPLANT
BLADE CUTTER GATOR 3.5 (BLADE) IMPLANT
BLADE GREAT WHITE 4.2 (BLADE) ×2 IMPLANT
CLOTH BEACON ORANGE TIMEOUT ST (SAFETY) ×2 IMPLANT
CUFF TOURNIQUET SINGLE 34IN LL (TOURNIQUET CUFF) IMPLANT
CUFF TOURNIQUET SINGLE 44IN (TOURNIQUET CUFF) IMPLANT
DRAPE ARTHROSCOPY W/POUCH 114 (DRAPES) ×2 IMPLANT
DRAPE PROXIMA HALF (DRAPES) ×2 IMPLANT
DRAPE U-SHAPE 47X51 STRL (DRAPES) ×2 IMPLANT
DRSG EMULSION OIL 3X3 NADH (GAUZE/BANDAGES/DRESSINGS) ×2 IMPLANT
DRSG PAD ABDOMINAL 8X10 ST (GAUZE/BANDAGES/DRESSINGS) ×2 IMPLANT
DURAPREP 26ML APPLICATOR (WOUND CARE) ×2 IMPLANT
FILTER STRAW FLUID ASPIR (MISCELLANEOUS) ×2 IMPLANT
GLOVE BIOGEL PI IND STRL 8 (GLOVE) ×2 IMPLANT
GLOVE BIOGEL PI INDICATOR 8 (GLOVE) ×2
GLOVE ECLIPSE 7.5 STRL STRAW (GLOVE) ×4 IMPLANT
GOWN PREVENTION PLUS XLARGE (GOWN DISPOSABLE) ×2 IMPLANT
GOWN SRG XL XLNG 56XLVL 4 (GOWN DISPOSABLE) ×1 IMPLANT
GOWN STRL NON-REIN LRG LVL3 (GOWN DISPOSABLE) ×2 IMPLANT
GOWN STRL NON-REIN XL XLG LVL4 (GOWN DISPOSABLE) ×1
KIT BASIN OR (CUSTOM PROCEDURE TRAY) ×2 IMPLANT
KIT ROOM TURNOVER OR (KITS) ×2 IMPLANT
NEEDLE 18GX1X1/2 (RX/OR ONLY) (NEEDLE) ×2 IMPLANT
PACK ARTHROSCOPY DSU (CUSTOM PROCEDURE TRAY) ×2 IMPLANT
PAD ARMBOARD 7.5X6 YLW CONV (MISCELLANEOUS) ×4 IMPLANT
PAD CAST 4YDX4 CTTN HI CHSV (CAST SUPPLIES) ×2 IMPLANT
PADDING CAST COTTON 4X4 STRL (CAST SUPPLIES) ×2
SET ARTHROSCOPY TUBING (MISCELLANEOUS) ×1
SET ARTHROSCOPY TUBING LN (MISCELLANEOUS) ×1 IMPLANT
SPONGE GAUZE 4X4 12PLY (GAUZE/BANDAGES/DRESSINGS) ×2 IMPLANT
SUT ETHILON 4 0 PS 2 18 (SUTURE) ×2 IMPLANT
SYR 5ML LL (SYRINGE) ×2 IMPLANT
TOWEL OR 17X24 6PK STRL BLUE (TOWEL DISPOSABLE) ×2 IMPLANT
TOWEL OR 17X26 10 PK STRL BLUE (TOWEL DISPOSABLE) ×2 IMPLANT
WRAP KNEE MAXI GEL POST OP (GAUZE/BANDAGES/DRESSINGS) ×2 IMPLANT

## 2012-02-13 NOTE — Anesthesia Postprocedure Evaluation (Signed)
Anesthesia Post Note  Patient: Guy Taylor  Procedure(s) Performed: Procedure(s) (LRB): ARTHROSCOPY KNEE (Right)  Anesthesia type: general  Patient location: PACU  Post pain: Pain level controlled  Post assessment: Patient's Cardiovascular Status Stable  Last Vitals:  Filed Vitals:   02/13/12 1626  BP:   Pulse:   Temp: 36.4 C  Resp:     Post vital signs: Reviewed and stable  Level of consciousness: sedated  Complications: No apparent anesthesia complications

## 2012-02-13 NOTE — Anesthesia Procedure Notes (Signed)
Procedure Name: Intubation Date/Time: 02/13/2012 1:16 PM Performed by: Angelica Pou Pre-anesthesia Checklist: Patient identified, Emergency Drugs available, Suction available, Patient being monitored and Timeout performed Patient Re-evaluated:Patient Re-evaluated prior to inductionOxygen Delivery Method: Circle system utilized Preoxygenation: Pre-oxygenation with 100% oxygen Intubation Type: IV induction and Rapid sequence Laryngoscope Size: Mac and 4 Grade View: Grade II Tube type: Oral Tube size: 7.5 mm Number of attempts: 1 Airway Equipment and Method: Stylet Placement Confirmation: ETT inserted through vocal cords under direct vision,  positive ETCO2 and breath sounds checked- equal and bilateral Secured at: 23 cm Tube secured with: Tape Dental Injury: Teeth and Oropharynx as per pre-operative assessment

## 2012-02-13 NOTE — H&P (Signed)
PREOPERATIVE H&P  Chief Complaint: l. Knee pain  HPI: Guy Taylor is a 41 y.o. male who presents for evaluation of l. Knee pain. It has been present for greater than 6 mon and has been worsening. He has failed conservative measures. Pain is rated as moderate.  Past Medical History  Diagnosis Date  . Hypertension   . Obesity   . Hypogonadism male   . Allergic rhinitis   . Diabetes mellitus   . Asthma   . Stones in the urinary tract   . Meniscus tear     rt knee   History reviewed. No pertinent past surgical history. History   Social History  . Marital Status: Married    Spouse Name: N/A    Number of Children: N/A  . Years of Education: N/A   Social History Main Topics  . Smoking status: Never Smoker   . Smokeless tobacco: Never Used  . Alcohol Use: No  . Drug Use: No  . Sexually Active: Yes   Other Topics Concern  . None   Social History Narrative  . None   Family History  Problem Relation Age of Onset  . Diabetes Mother    Allergies  Allergen Reactions  . Lisinopril Shortness Of Breath and Swelling   Prior to Admission medications   Medication Sig Start Date End Date Taking? Authorizing Provider  amLODipine (NORVASC) 10 MG tablet Take 1 tablet (10 mg total) by mouth daily. 07/26/11 07/25/12 Yes Ronnald Nian, MD  losartan-hydrochlorothiazide [facility]) 100-12.5 MG per tablet Take 1 tablet by mouth daily. 07/26/11 07/25/12 Yes Ronnald Nian, MD  pioglitazone-metformin (ACTOPLUS MET) 15-500 MG per tablet Take 1 tablet by mouth 2 (two) times daily with a meal. 07/26/11  Yes Ronnald Nian, MD  rosuvastatin (CRESTOR) 20 MG tablet Take 20 mg by mouth daily.     Yes Historical Provider, MD  testosterone (TESTIM) 50 MG/5GM GEL Place 5 g onto the skin daily. 12/28/11  Yes Ronnald Nian, MD     Positive ROS: none  All other systems have been reviewed and were otherwise negative with the exception of those mentioned in the HPI and as above.  Physical Exam: Filed Vitals:     02/13/12 1118  BP: 156/100  Temp: 98.3 F (36.8 C)  Resp: 20    General: Alert, no acute distress Cardiovascular: No pedal edema Respiratory: No cyanosis, no use of accessory musculature GI: No organomegaly, abdomen is soft and non-tender Skin: No lesions in the area of chief complaint Neurologic: Sensation intact distally Psychiatric: Patient is competent for consent with normal mood and affect Lymphatic: No axillary or cervical lymphadenopathy  MUSCULOSKELETAL: l knee: +med pain +mcmurray -instability  MRI: +MMT  Assessment/Plan: medial meniscus tear Plan for Procedure(s): ARTHROSCOPY KNEE  The risks benefits and alternatives were discussed with the patient including but not limited to the risks of nonoperative treatment, versus surgical intervention including infection, bleeding, nerve injury, malunion, nonunion, hardware prominence, hardware failure, need for hardware removal, blood clots, cardiopulmonary complications, morbidity, mortality, among others, and they were willing to proceed.  Predicted outcome is good, although there will be at least a six to nine month expected recovery.  Harvie Junior, MD 02/13/2012 12:35 PM

## 2012-02-13 NOTE — Preoperative (Signed)
Beta Blockers   Reason not to administer Beta Blockers:Not Applicable 

## 2012-02-13 NOTE — Transfer of Care (Signed)
Immediate Anesthesia Transfer of Care Note  Patient: Guy Taylor  Procedure(s) Performed: Procedure(s) (LRB) with comments: ARTHROSCOPY KNEE (Right)  Patient Location: PACU  Anesthesia Type: General  Level of Consciousness: awake, oriented and patient cooperative  Airway & Oxygen Therapy: Patient Spontanous Breathing and Patient connected to face mask oxygen  Post-op Assessment: Report given to PACU RN, Post -op Vital signs reviewed and stable, Patient moving all extremities and Dr Krista Blue notified of elevated BP, plan to treat with Labetolol  Post vital signs: Reviewed and stable  Complications: No apparent anesthesia complications

## 2012-02-13 NOTE — Anesthesia Preprocedure Evaluation (Signed)
Anesthesia Evaluation  Patient identified by MRN, date of birth, ID band Patient awake    Reviewed: Allergy & Precautions, H&P , NPO status , Patient's Chart, lab work & pertinent test results  Airway Mallampati: II TM Distance: >3 FB     Dental  (+) Teeth Intact and Dental Advisory Given   Pulmonary asthma ,  breath sounds clear to auscultation  Pulmonary exam normal       Cardiovascular hypertension, Rhythm:Regular Rate:Normal     Neuro/Psych negative neurological ROS     GI/Hepatic negative GI ROS, Neg liver ROS,   Endo/Other  diabetes  Renal/GU negative Renal ROS     Musculoskeletal   Abdominal   Peds  Hematology   Anesthesia Other Findings   Reproductive/Obstetrics                           Anesthesia Physical Anesthesia Plan  ASA: III  Anesthesia Plan: General   Post-op Pain Management:    Induction: Intravenous  Airway Management Planned: LMA  Additional Equipment:   Intra-op Plan:   Post-operative Plan: Extubation in OR  Informed Consent: I have reviewed the patients History and Physical, chart, labs and discussed the procedure including the risks, benefits and alternatives for the proposed anesthesia with the patient or authorized representative who has indicated his/her understanding and acceptance.   Dental advisory given  Plan Discussed with: CRNA, Anesthesiologist and Surgeon  Anesthesia Plan Comments:         Anesthesia Quick Evaluation

## 2012-02-14 ENCOUNTER — Encounter (HOSPITAL_COMMUNITY): Payer: Self-pay | Admitting: Orthopedic Surgery

## 2012-02-17 NOTE — Op Note (Signed)
NAME:  Guy Taylor, Guy Taylor                ACCOUNT NO.:  [id number]  MEDICAL RECORD NO.:  [id number]  LOCATION:  [facility]                         FACILITY:  [facility]  PHYSICIAN:  Harvie Junior, M.D.   DATE OF BIRTH:  April 01, 1971  DATE OF PROCEDURE: DATE OF DISCHARGE:  02/13/2012                              OPERATIVE REPORT   PREOPERATIVE DIAGNOSIS:  Medial meniscal tear.  POSTOPERATIVE DIAGNOSES: 1. Medial meniscal tear. 2. Chondromalacia patellofemoral joint.  PROCEDURE: 1. Right knee arthroscopy with debridement of posterior and medial     meniscal tear. 2. Debridement of chondromalacia patellofemoral joint, right knee.  SURGEON:  Harvie Junior, MD  ASSISTANT:  Marshia Ly, PA  ANESTHESIA:  General.  BRIEF HISTORY:  [name] is a 41 year old gentleman, who was evaluated in the office with right knee pain.  He was referred by Dr. __________ with MRI documented meniscal tear.  He had been treated conservatively for a period of time but it failed conservative care because of failure of conservative care, MRI showing meniscal tearing and significant medial joint line pain and symptoms consistent with the diagnosis.  The patient was taken to the operating room for operative knee arthroscopy. Because of his large size, morbid obesity, the patient needed to be done at the [facility] and he was brought to the operating room there for the treatment of this procedure.  PROCEDURE:  The patient was brought to the operating room.  After adequate anesthesia was obtained with general anesthetic, the patient was placed supine on the operating table.  The right leg was prepped and draped in usual sterile fashion.  Following this, routine arthroscopic examination of the knee revealed there was an obvious posterior horn medial meniscal tear which was debrided back to a smooth and stable rim. There was some softening of the cartilage on the medial side with some fissuring but no frank  full-thickness cartilage loss.  This was not debrided at this point.  Attention turned to the ACL normal lateral side.  It showed no significant chondromalacia.  Attention turned up to the patellofemoral joint which showed some grade 2 and grade 3 chondromalacia which was debrided back to a smooth and stable rim.  Once this was completed, the knee was copiously and thoroughly lavaged with 6 L of normal saline irrigation and suctioned dry.  The arthroscopic portals were closed with a bandage.  Sterile compressive dressing was applied and the patient taken to recovery and noted to be in satisfactory condition.  Estimated blood loss for procedure was none.     Harvie Junior, M.D.     Ranae Plumber  D:  02/16/2012  T:  02/17/2012  Job:  [id number]

## 2012-05-31 ENCOUNTER — Other Ambulatory Visit: Payer: Self-pay | Admitting: Family Medicine

## 2012-06-10 ENCOUNTER — Other Ambulatory Visit: Payer: Self-pay | Admitting: Family Medicine

## 2012-06-11 ENCOUNTER — Other Ambulatory Visit: Payer: Self-pay

## 2012-07-11 ENCOUNTER — Telehealth: Payer: Self-pay | Admitting: Internal Medicine

## 2012-07-11 MED ORDER — PIOGLITAZONE HCL-METFORMIN HCL 15-500 MG PO TABS: ORAL_TABLET | ORAL | Status: DC

## 2012-07-11 NOTE — Telephone Encounter (Signed)
REFILL SENT 

## 2012-08-15 ENCOUNTER — Telehealth: Payer: Self-pay | Admitting: Family Medicine

## 2012-08-16 ENCOUNTER — Other Ambulatory Visit: Payer: Self-pay

## 2012-08-16 MED ORDER — PIOGLITAZONE HCL-METFORMIN HCL 15-500 MG PO TABS: ORAL_TABLET | ORAL | Status: DC

## 2012-08-16 NOTE — Telephone Encounter (Signed)
DONE

## 2012-08-16 NOTE — Telephone Encounter (Signed)
SENT IN PT DIABETES MED BUT NO MORE REFILLS TILL PT HAS DIABETES CHECK

## 2012-09-12 ENCOUNTER — Encounter: Payer: Self-pay | Admitting: Internal Medicine

## 2012-09-12 NOTE — Telephone Encounter (Signed)
This encounter was created in error - please disregard.

## 2012-10-07 ENCOUNTER — Other Ambulatory Visit: Payer: Self-pay | Admitting: Family Medicine

## 2012-11-05 ENCOUNTER — Telehealth: Payer: Self-pay

## 2012-11-05 ENCOUNTER — Other Ambulatory Visit: Payer: Self-pay | Admitting: Family Medicine

## 2012-11-05 NOTE — Telephone Encounter (Signed)
I CALLED AND LEFT MESSAGE FOR PT HE HAS TO HAVE APT BEFORE ANYMORE REFILLS THIS HAS BEEN PUT ON HIS BOTTLE FROM PHARMACY 3 TIMES NOW AND HE STILL HAS NOT MADE APT

## 2012-11-05 NOTE — Telephone Encounter (Signed)
Error

## 2012-11-12 ENCOUNTER — Ambulatory Visit (INDEPENDENT_AMBULATORY_CARE_PROVIDER_SITE_OTHER): Payer: 59 | Admitting: Family Medicine

## 2012-11-12 ENCOUNTER — Encounter: Payer: Self-pay | Admitting: Family Medicine

## 2012-11-12 ENCOUNTER — Other Ambulatory Visit: Payer: Self-pay | Admitting: Family Medicine

## 2012-11-12 VITALS — BP 190/130 | HR 100

## 2012-11-12 DIAGNOSIS — E785 Hyperlipidemia, unspecified: Secondary | ICD-10-CM

## 2012-11-12 DIAGNOSIS — Z91199 Patient's noncompliance with other medical treatment and regimen due to unspecified reason: Secondary | ICD-10-CM

## 2012-11-12 DIAGNOSIS — I10 Essential (primary) hypertension: Secondary | ICD-10-CM

## 2012-11-12 DIAGNOSIS — E291 Testicular hypofunction: Secondary | ICD-10-CM

## 2012-11-12 DIAGNOSIS — E119 Type 2 diabetes mellitus without complications: Secondary | ICD-10-CM

## 2012-11-12 DIAGNOSIS — Z125 Encounter for screening for malignant neoplasm of prostate: Secondary | ICD-10-CM

## 2012-11-12 DIAGNOSIS — Z79899 Other long term (current) drug therapy: Secondary | ICD-10-CM

## 2012-11-12 DIAGNOSIS — Z9119 Patient's noncompliance with other medical treatment and regimen: Secondary | ICD-10-CM

## 2012-11-12 DIAGNOSIS — E669 Obesity, unspecified: Secondary | ICD-10-CM

## 2012-11-12 LAB — POCT GLYCOSYLATED HEMOGLOBIN (HGB A1C): Hemoglobin A1C: 6.3

## 2012-11-12 LAB — COMPREHENSIVE METABOLIC PANEL
ALT: 15 U/L (ref 0–53)
AST: 20 U/L (ref 0–37)
Albumin: 4.4 g/dL (ref 3.5–5.2)
Alkaline Phosphatase: 60 U/L (ref 39–117)
Calcium: 9.7 mg/dL (ref 8.4–10.5)
Chloride: 104 mEq/L (ref 96–112)
Potassium: 4.1 mEq/L (ref 3.5–5.3)
Sodium: 139 mEq/L (ref 135–145)
Total Protein: 7.2 g/dL (ref 6.0–8.3)

## 2012-11-12 LAB — LIPID PANEL: LDL Cholesterol: 124 mg/dL — ABNORMAL HIGH (ref 0–99)

## 2012-11-12 LAB — CBC WITH DIFFERENTIAL/PLATELET
Basophils Absolute: 0 10*3/uL (ref 0.0–0.1)
Basophils Relative: 0 % (ref 0–1)
HCT: 45.7 % (ref 39.0–52.0)
Lymphocytes Relative: 36 % (ref 12–46)
Monocytes Absolute: 0.4 10*3/uL (ref 0.1–1.0)
Neutro Abs: 2.5 10*3/uL (ref 1.7–7.7)
Neutrophils Relative %: 50 % (ref 43–77)
Platelets: 236 10*3/uL (ref 150–400)
RDW: 14.6 % (ref 11.5–15.5)
WBC: 5.2 10*3/uL (ref 4.0–10.5)

## 2012-11-13 ENCOUNTER — Encounter: Payer: Self-pay | Admitting: Family Medicine

## 2012-11-13 LAB — TESTOSTERONE: Testosterone: 213 ng/dL — ABNORMAL LOW (ref 300–890)

## 2012-11-13 LAB — PSA: PSA: 0.78 ng/mL (ref <=4.00)

## 2012-11-13 NOTE — Progress Notes (Signed)
Quick Note:  PT WIFE INFORMED WORD FOR WORD His testosterone was low. Make sure he is getting his medication and taking it regularly. Her know that we will recheck his cholesterol numbers after he has been seen in the diabetes educations program. SHE VERBALIZED UNDERSTANDING ______

## 2012-11-13 NOTE — Progress Notes (Signed)
  Subjective:    Patient ID: Guy Taylor, male    DOB: Dec 23, 1970, 42 y.o.   MRN: [id number]  HPI The patient was seen on 623. He has been noncompliant with taking his blood pressure medications but does state he was taking his diabetes medicines regularly. He has made very little changes in his diet and exercise regimen. Apparently ran out of his testosterone. Social history was reviewed in terms of smoking and drinking. He states that his long work hours interfere with him taking better care of himself   Review of Systems     Objective:   Physical Exam        Assessment & Plan:  Blood work from today does indicate low testosterone. He is to be referred to diabetes education. Also reinforced the need for him to make further exercise changes and discussed taking his medications on a more regular basis.over 30 minutes spent discussing all these issues with him and his wife. Discussed the fact that his present lifestyle is shortening his life.

## 2012-11-14 ENCOUNTER — Telehealth: Payer: Self-pay | Admitting: Family Medicine

## 2012-11-14 NOTE — Telephone Encounter (Signed)
Received fax refill request from [facility]   Pioglitazone/metformin 15-500 mg     Last filled 11/05/12 #60

## 2012-11-15 ENCOUNTER — Other Ambulatory Visit: Payer: Self-pay

## 2012-11-15 MED ORDER — PIOGLITAZONE HCL-METFORMIN HCL 15-500 MG PO TABS: ORAL_TABLET | ORAL | Status: DC

## 2012-11-15 NOTE — Telephone Encounter (Signed)
Check with the drugstore on this. This doesn't make any sense

## 2012-11-15 NOTE — Telephone Encounter (Signed)
SENT DIABETES MEDS IN

## 2012-12-17 ENCOUNTER — Ambulatory Visit: Payer: 59 | Admitting: Family Medicine

## 2012-12-25 ENCOUNTER — Ambulatory Visit (INDEPENDENT_AMBULATORY_CARE_PROVIDER_SITE_OTHER): Payer: 59 | Admitting: Family Medicine

## 2012-12-25 ENCOUNTER — Encounter: Payer: Self-pay | Admitting: Family Medicine

## 2012-12-25 VITALS — BP 150/100 | HR 68 | Wt 398.0 lb

## 2012-12-25 DIAGNOSIS — I10 Essential (primary) hypertension: Secondary | ICD-10-CM

## 2012-12-25 DIAGNOSIS — E119 Type 2 diabetes mellitus without complications: Secondary | ICD-10-CM

## 2012-12-25 DIAGNOSIS — E669 Obesity, unspecified: Secondary | ICD-10-CM

## 2012-12-25 DIAGNOSIS — E291 Testicular hypofunction: Secondary | ICD-10-CM

## 2012-12-25 MED ORDER — CARVEDILOL 6.25 MG PO TABS: 6.2500 mg | ORAL_TABLET | Freq: Two times a day (BID) | ORAL | Status: DC

## 2012-12-25 NOTE — Progress Notes (Signed)
  Subjective:    Patient ID: Guy Taylor, male    DOB: May 01, 1971, 42 y.o.   MRN: [id number]  HPI He is here for recheck. He is on Testim but doesn't like the smell. He continues on his diabetes meds and notes his blood sugars are running in the low 100s. He continues on the blood pressure medications. He is now under 400 pounds. He had a bet with his wife in order to accomplish this. He has made some dietary changes and states at work keeps him quite busy with walking regularly. He continues on other medications listed in the chart. He has no other concerns or complaints.   Review of Systems     Objective:   Physical Exam Alert and in no distress otherwise not examined       Assessment & Plan:  Accelerated hypertension - Plan: carvedilol (COREG) 6.25 MG tablet  Diabetes mellitus  Hypogonadism male - Plan: Testosterone  Obesity (BMI 35.0-39.9 without comorbidity)  I encouraged him to continue with his diet and exercise regimen and continue to lose weight. I will add Coreg to his regimen since his blood pressure still not under good control. Continue on testosterone. He notes he had difficulty getting this approved and will do with the odor issue.

## 2012-12-26 ENCOUNTER — Other Ambulatory Visit: Payer: Self-pay

## 2012-12-26 MED ORDER — TESTOSTERONE 50 MG/5GM (1%) TD GEL: 10.0000 g | Freq: Every day | TRANSDERMAL | Status: DC

## 2012-12-26 NOTE — Telephone Encounter (Signed)
SENT NEW DOSE IN ON TESTOSTERONE

## 2012-12-30 ENCOUNTER — Telehealth: Payer: Self-pay | Admitting: Family Medicine

## 2013-01-01 ENCOUNTER — Telehealth: Payer: Self-pay | Admitting: Family Medicine

## 2013-01-01 NOTE — Telephone Encounter (Signed)
LM

## 2013-02-07 ENCOUNTER — Ambulatory Visit (INDEPENDENT_AMBULATORY_CARE_PROVIDER_SITE_OTHER): Payer: 59 | Admitting: Family Medicine

## 2013-02-07 ENCOUNTER — Encounter: Payer: Self-pay | Admitting: Family Medicine

## 2013-02-07 VITALS — BP 140/92 | HR 84 | Ht 78.0 in | Wt >= 6400 oz

## 2013-02-07 DIAGNOSIS — I152 Hypertension secondary to endocrine disorders: Secondary | ICD-10-CM

## 2013-02-07 DIAGNOSIS — J302 Other seasonal allergic rhinitis: Secondary | ICD-10-CM

## 2013-02-07 DIAGNOSIS — J309 Allergic rhinitis, unspecified: Secondary | ICD-10-CM

## 2013-02-07 DIAGNOSIS — Z23 Encounter for immunization: Secondary | ICD-10-CM

## 2013-02-07 DIAGNOSIS — Z0000 Encounter for general adult medical examination without abnormal findings: Secondary | ICD-10-CM

## 2013-02-07 DIAGNOSIS — E291 Testicular hypofunction: Secondary | ICD-10-CM

## 2013-02-07 DIAGNOSIS — E1169 Type 2 diabetes mellitus with other specified complication: Secondary | ICD-10-CM

## 2013-02-07 DIAGNOSIS — E119 Type 2 diabetes mellitus without complications: Secondary | ICD-10-CM

## 2013-02-07 DIAGNOSIS — E785 Hyperlipidemia, unspecified: Secondary | ICD-10-CM

## 2013-02-07 DIAGNOSIS — E1159 Type 2 diabetes mellitus with other circulatory complications: Secondary | ICD-10-CM

## 2013-02-07 DIAGNOSIS — I10 Essential (primary) hypertension: Secondary | ICD-10-CM

## 2013-02-07 LAB — POCT GLYCOSYLATED HEMOGLOBIN (HGB A1C): Hemoglobin A1C: 6.8

## 2013-02-07 LAB — HEMOCCULT GUIAC POC 1CARD (OFFICE)

## 2013-02-07 MED ORDER — CARVEDILOL 12.5 MG PO TABS: 12.5000 mg | ORAL_TABLET | Freq: Two times a day (BID) | ORAL | Status: DC

## 2013-02-07 MED ORDER — ROSUVASTATIN CALCIUM 20 MG PO TABS: 20.0000 mg | ORAL_TABLET | Freq: Every day | ORAL | Status: DC

## 2013-02-07 NOTE — Progress Notes (Signed)
  Subjective:    Patient ID: Guy Taylor, male    DOB: Jul 16, 1970, 42 y.o.   MRN: [id number]  HPI He is here for a complete examination. He continues on medications listed in the chart. He states that he has made some dietary modifications but has actually gained a couple pounds. This has been quite frustrated. His allergies are under good control. His blood pressure is recorded. He continues on 2 Testim per day but has not noted any major changes in his energy and stamina. His work and home life continue to go well. He does check his blood sugars intermittently. His exercise is usually through work. Social and family history were reviewed.   Review of Systems Negative except as above     Objective:   Physical Exam BP 140/92  Pulse 84  Ht 6\' 6"  (1.981 m)  Wt 400 lb (181.439 kg)  BMI 46.23 kg/m2  General Appearance:    Alert, cooperative, no distress, appears stated age  Head:    Normocephalic, without obvious abnormality, atraumatic  Eyes:    PERRL, conjunctiva/corneas clear, EOM's intact, fundi    benign  Ears:    Normal TM's and external ear canals  Nose:   Nares normal, mucosa normal, no drainage or sinus   tenderness  Throat:   Lips, mucosa, and tongue normal; teeth and gums normal  Neck:   Supple, no lymphadenopathy;  thyroid:  no   enlargement/tenderness/nodules; no carotid   bruit or JVD  Back:    Spine nontender, no curvature, ROM normal, no CVA     tenderness  Lungs:     Clear to auscultation bilaterally without wheezes, rales or     ronchi; respirations unlabored  Chest Wall:    No tenderness or deformity   Heart:    Regular rate and rhythm, S1 and S2 normal, no murmur, rub   or gallop  Breast Exam:    No chest wall tenderness, masses or gynecomastia  Abdomen:     Soft, non-tender, nondistended, normoactive bowel sounds,    no masses, no hepatosplenomegaly     Rectal:    Normal sphincter tone, no masses or tenderness; guaiac negative stool.  Prostate smooth, no  nodules, not enlarged.  Extremities:   No clubbing, cyanosis or edema  Pulses:   2+ and symmetric all extremities  Skin:   Skin color, texture, turgor normal, no rashes or lesions  Lymph nodes:   Cervical, supraclavicular, and axillary nodes normal  Neurologic:   CNII-XII intact, normal strength, sensation and gait; reflexes 2+ and symmetric throughout          Psych:   Normal mood, affect, hygiene and grooming.          Assessment & Plan:  Routine general medical examination at a health care facility - Plan: Hemoccult - 1 Card (office)  Diabetes mellitus - Plan: Ambulatory referral to Ophthalmology, POCT glycosylated hemoglobin (Hb A1C)  Morbid obesity  Allergic rhinitis, seasonal  Hypertension associated with diabetes - Plan: carvedilol (COREG) 12.5 MG tablet  Hyperlipidemia LDL goal <70 - Plan: rosuvastatin (CRESTOR) 20 MG tablet  Need for prophylactic vaccination and inoculation against influenza - Plan: Flu Vaccine QUAD 36+ mos IM  Hypogonadism male - Plan: Testosterone discussed diet and exercise with him as well as seeing a nutritionist however at this point he says he does not have time. He'll continue on his present medications. Flu shot given with risks and benefits discussed.

## 2013-02-08 LAB — TESTOSTERONE: Testosterone: 307 ng/dL (ref 300–890)

## 2013-02-08 NOTE — Progress Notes (Signed)
Quick Note:  CALLED PT SPOKE WITH WIFE SHE WAS INFORMED AND VERBALIZED UNDERSTANDING ______

## 2013-03-26 ENCOUNTER — Encounter (HOSPITAL_COMMUNITY): Payer: Self-pay | Admitting: Emergency Medicine

## 2013-03-26 ENCOUNTER — Emergency Department (HOSPITAL_COMMUNITY)
Admission: EM | Admit: 2013-03-26 | Discharge: 2013-03-26 | Disposition: A | Discharge status: Home / Self Care | Payer: 59 | Attending: Emergency Medicine | Admitting: Emergency Medicine

## 2013-03-26 DIAGNOSIS — E291 Testicular hypofunction: Secondary | ICD-10-CM | POA: Insufficient documentation

## 2013-03-26 DIAGNOSIS — E119 Type 2 diabetes mellitus without complications: Secondary | ICD-10-CM | POA: Insufficient documentation

## 2013-03-26 DIAGNOSIS — E669 Obesity, unspecified: Secondary | ICD-10-CM | POA: Insufficient documentation

## 2013-03-26 DIAGNOSIS — Y929 Unspecified place or not applicable: Secondary | ICD-10-CM | POA: Insufficient documentation

## 2013-03-26 DIAGNOSIS — J45909 Unspecified asthma, uncomplicated: Secondary | ICD-10-CM | POA: Insufficient documentation

## 2013-03-26 DIAGNOSIS — Y9389 Activity, other specified: Secondary | ICD-10-CM | POA: Insufficient documentation

## 2013-03-26 DIAGNOSIS — Z87828 Personal history of other (healed) physical injury and trauma: Secondary | ICD-10-CM | POA: Insufficient documentation

## 2013-03-26 DIAGNOSIS — I10 Essential (primary) hypertension: Secondary | ICD-10-CM | POA: Insufficient documentation

## 2013-03-26 DIAGNOSIS — Z79899 Other long term (current) drug therapy: Secondary | ICD-10-CM | POA: Insufficient documentation

## 2013-03-26 DIAGNOSIS — Z87442 Personal history of urinary calculi: Secondary | ICD-10-CM | POA: Insufficient documentation

## 2013-03-26 DIAGNOSIS — S81802A Unspecified open wound, left lower leg, initial encounter: Secondary | ICD-10-CM

## 2013-03-26 DIAGNOSIS — S81009A Unspecified open wound, unspecified knee, initial encounter: Secondary | ICD-10-CM | POA: Insufficient documentation

## 2013-03-26 DIAGNOSIS — X58XXXA Exposure to other specified factors, initial encounter: Secondary | ICD-10-CM | POA: Insufficient documentation

## 2013-03-26 NOTE — ED Provider Notes (Signed)
Medical screening examination/treatment/procedure(s) were performed by non-physician practitioner and as supervising physician I was immediately available for consultation/collaboration.   Deysha Cartier, MD 03/26/13 0514 

## 2013-03-26 NOTE — ED Notes (Signed)
Pt scratched a place on his lower leg and was unable to stop the bleeding at home, now the bleeding is controlled

## 2013-03-26 NOTE — ED Notes (Signed)
Pt states that he scratched a scab to left lower leg and that the bleeding was uncontrolled at the house; pt wrapped area with gauze and ace wrap; bleeding controlled upon arrival

## 2013-03-26 NOTE — ED Provider Notes (Signed)
CSN: [id number]     Arrival date & time 03/26/13  0143 History   First MD Initiated Contact with Patient 03/26/13 0200     Chief Complaint  Patient presents with  . Wound Check   (Consider location/radiation/quality/duration/timing/severity/associated sxs/prior Treatment) HPI Comments: Patient is a 42 year old male with no significant past medical history who presents for bleeding from his left lateral lower extremity. Patient states that he had a scab just proximal to his left lateral ankle which he scratched tonight. Upon accidental removal of the scab patient states that he experienced bleeding from the area. Patient states that bleeding was continuous and lasted approximately 10 minutes. Patient applied pressure to the area with gauze and an Ace wrap with mild relief; however, patient now states that bleeding has completely resolved. Patient is not on any blood thinners. He denies numbness/tingling, weakness, pallor, and fever.  Patient is a 42 y.o. male presenting with wound check. The history is provided by the patient. No language interpreter was used.  Wound Check This is a new problem. The current episode started today. The problem has been rapidly improving. Pertinent negatives include no fatigue, fever, numbness or weakness. Exacerbated by: scratching area. Treatments tried: pressure, time. The treatment provided significant relief.    Past Medical History  Diagnosis Date  . Hypertension   . Obesity   . Hypogonadism male   . Allergic rhinitis   . Diabetes mellitus   . Asthma   . Stones in the urinary tract   . Meniscus tear     rt knee   Past Surgical History  Procedure Laterality Date  . Knee arthroscopy  02/13/2012    Procedure: ARTHROSCOPY KNEE;  Surgeon: Harvie Junior, MD;  Location: [facility] OR;  Service: Orthopedics;  Laterality: Right;   Family History  Problem Relation Age of Onset  . Diabetes Mother    History  Substance Use Topics  . Smoking status: Never Smoker    . Smokeless tobacco: Never Used  . Alcohol Use: No    Review of Systems  Constitutional: Negative for fever and fatigue.  Skin: Positive for wound. Negative for color change and pallor.  Neurological: Negative for weakness and numbness.  All other systems reviewed and are negative.    Allergies  Lisinopril  Home Medications   Current Outpatient Rx  Name  Route  Sig  Dispense  Refill  . amLODipine (NORVASC) 10 MG tablet      TAKE 1 TABLET BY MOUTH EVERY DAY   30 tablet   11   . carvedilol (COREG) 12.5 MG tablet   Oral   Take 1 tablet (12.5 mg total) by mouth 2 (two) times daily with a meal.   60 tablet   11   . losartan-hydrochlorothiazide (HYZAAR) 100-12.5 MG per tablet      TAKE 1 TABLET BY MOUTH EVERY DAY   90 tablet   3   . pioglitazone-metformin (ACTOPLUS MET) 15-500 MG per tablet      TAKE 1 TABLET BY MOUTH TWICE DAILY   60 tablet   4   . rosuvastatin (CRESTOR) 20 MG tablet   Oral   Take 1 tablet (20 mg total) by mouth daily.   30 tablet   11   . testosterone (TESTIM) 50 MG/5GM GEL   Transdermal   Place 10 g onto the skin daily.   60 Tube   5    BP 152/88  Pulse 105  Temp(Src) 97.8 F (36.6 C) (Oral)  Resp 20  Ht 6\' 6"  (1.981 m)  Wt 400 lb (181.439 kg)  BMI 46.23 kg/m2  SpO2 96%  Physical Exam  Nursing note and vitals reviewed. Constitutional: He is oriented to person, place, and time. He appears well-developed and well-nourished. No distress.  HENT:  Head: Normocephalic and atraumatic.  Eyes: Conjunctivae and EOM are normal. No scleral icterus.  Neck: Normal range of motion.  Cardiovascular: Normal rate, regular rhythm and intact distal pulses.   Pulses:      Dorsalis pedis pulses are 2+ on the right side, and 2+ on the left side.       Posterior tibial pulses are 2+ on the right side, and 2+ on the left side.  Capillary refill normal. Rate not tachycardic as noted in triage.  Pulmonary/Chest: Effort normal. No respiratory  distress.  Musculoskeletal: Normal range of motion.       Left lower leg: He exhibits tenderness (extremely mild at wound site). He exhibits no bony tenderness, no swelling, no edema, no deformity and no laceration.  Punctate wound to left lateral ankle proximal to lateral malleolus. No bleeding, induration, swelling, erythema, or pallor.  Neurological: He is alert and oriented to person, place, and time.  No sensory or motor deficits. Patient moves extremities without ataxia.  Skin: Skin is warm and dry. No rash noted. He is not diaphoretic. No erythema. No pallor.  Extremities warm to touch  Psychiatric: He has a normal mood and affect. His behavior is normal.    ED Course  Procedures (including critical care time) Labs Review Labs Reviewed - No data to display Imaging Review No results found.  EKG Interpretation   None       MDM   1. Wound of left leg, initial encounter    Patient presents for bleeding to his left lateral lower extremity after scratching a scab off of his leg. Bleeding is well controlled at this time. Punctate wound with minimal tenderness at wound site. Patient is neurovascularly intact in his left lower extremity is perfusing well. Patient is not tachycardic as noted triage and he has no signs or findings concerning for acute blood loss. Patient not on blood thinners. Pressure dressing applied in ED for symptoms. Patient stable and appropriate for discharge with instruction for primary care followup to ensure that symptoms resolved. Return precautions discussed and patient able to plan with no unaddressed concerns.    Antony Madura, PA-C 03/26/13 [phone]

## 2013-05-07 ENCOUNTER — Other Ambulatory Visit: Payer: Self-pay | Admitting: Family Medicine

## 2013-05-22 ENCOUNTER — Other Ambulatory Visit: Payer: Self-pay

## 2013-05-22 ENCOUNTER — Ambulatory Visit (INDEPENDENT_AMBULATORY_CARE_PROVIDER_SITE_OTHER): Payer: 59 | Admitting: Family Medicine

## 2013-05-22 VITALS — BP 128/90 | HR 90 | Wt >= 6400 oz

## 2013-05-22 DIAGNOSIS — K602 Anal fissure, unspecified: Secondary | ICD-10-CM

## 2013-05-22 MED ORDER — PIOGLITAZONE HCL-METFORMIN HCL 15-500 MG PO TABS: ORAL_TABLET | ORAL | Status: DC

## 2013-05-22 NOTE — Progress Notes (Signed)
   Subjective:    Patient ID: Guy Taylor, male    DOB: [birth year], 42 y.o.   MRN: [id number]  HPI He is here for evaluation of a two-week history of intermittent difficulty with seeing blood in his stool. Initially this occurred after he had a BM and he did note some stinging sensation. He states his bowel habits have been fairly regular.  Review of Systems     Objective:   Physical Exam Rectal exam does show a raw area at 6:00 which was slightly uncomfortable to palpation    Assessment & Plan:  Rectal fissure  explained the situation to him recommended fluids, bulk in diet, exercise help keep him regular. Also recommended sitz bath to help with the healing process.

## 2013-07-19 ENCOUNTER — Other Ambulatory Visit: Payer: Self-pay | Admitting: Family Medicine

## 2013-07-22 NOTE — Telephone Encounter (Signed)
Forwarding to you :)

## 2013-07-22 NOTE — Telephone Encounter (Signed)
Medication sent in. 

## 2013-08-22 ENCOUNTER — Other Ambulatory Visit: Payer: Self-pay | Admitting: Family Medicine

## 2013-09-30 ENCOUNTER — Other Ambulatory Visit: Payer: Self-pay | Admitting: Family Medicine

## 2013-11-06 ENCOUNTER — Ambulatory Visit (INDEPENDENT_AMBULATORY_CARE_PROVIDER_SITE_OTHER): Payer: 59 | Admitting: Family Medicine

## 2013-11-06 ENCOUNTER — Encounter: Payer: Self-pay | Admitting: Family Medicine

## 2013-11-06 VITALS — BP 102/78 | Wt 381.0 lb

## 2013-11-06 DIAGNOSIS — R51 Headache: Secondary | ICD-10-CM

## 2013-11-06 DIAGNOSIS — IMO0001 Reserved for inherently not codable concepts without codable children: Secondary | ICD-10-CM

## 2013-11-06 DIAGNOSIS — L03019 Cellulitis of unspecified finger: Secondary | ICD-10-CM

## 2013-11-06 NOTE — Progress Notes (Signed)
   Subjective:    Patient ID: Guy Taylor, male    DOB: 17-Jun-1970, 43 y.o.   MRN: [id number]  HPI 4 days ago he had the onset of right temporal pain with dizziness blurred vision. No numbness or tingling. The symptoms went away after about 15 minutes. He has not had any recurrence of his headaches since then.. He also has some swelling and pain of the left index finger at the nail bed.   Review of Systems     Objective:   Physical Exam alert and in no distress. DTRs are normal. Cerebellar testing normal. Tympanic membranes and canals are normal. Throat is clear. Tonsils are normal. Neck is supple without adenopathy or thyromegaly. Cardiac exam shows a regular sinus rhythm without murmurs or gallops. Lungs are clear to auscultation. Exam of the left second finger does show swelling and tenderness at the edge of the cuticle       Assessment & Plan:  Headache(784.0)  Paronychia of second finger, left  an I and D. was performed on the paronychia with periodic discharge removed. The wound was covered with a Band-Aid. I discussed the headache with him. Explained that it did not sound like migraine or tension headache. Recommend he be vigilant on this and if it occurs again, he will let me know. Also explained that this was not related to his blood pressure

## 2013-12-10 ENCOUNTER — Other Ambulatory Visit: Payer: Self-pay | Admitting: Family Medicine

## 2014-01-07 ENCOUNTER — Other Ambulatory Visit: Payer: Self-pay | Admitting: Family Medicine

## 2014-03-11 ENCOUNTER — Telehealth: Payer: Self-pay | Admitting: Family Medicine

## 2014-03-11 ENCOUNTER — Encounter: Payer: Self-pay | Admitting: Family Medicine

## 2014-03-11 ENCOUNTER — Ambulatory Visit (INDEPENDENT_AMBULATORY_CARE_PROVIDER_SITE_OTHER): Payer: 59 | Admitting: Family Medicine

## 2014-03-11 VITALS — BP 142/92 | HR 68 | Wt >= 6400 oz

## 2014-03-11 DIAGNOSIS — I152 Hypertension secondary to endocrine disorders: Secondary | ICD-10-CM

## 2014-03-11 DIAGNOSIS — E785 Hyperlipidemia, unspecified: Secondary | ICD-10-CM

## 2014-03-11 DIAGNOSIS — L03119 Cellulitis of unspecified part of limb: Secondary | ICD-10-CM

## 2014-03-11 DIAGNOSIS — E291 Testicular hypofunction: Secondary | ICD-10-CM

## 2014-03-11 DIAGNOSIS — E1159 Type 2 diabetes mellitus with other circulatory complications: Secondary | ICD-10-CM

## 2014-03-11 DIAGNOSIS — E119 Type 2 diabetes mellitus without complications: Secondary | ICD-10-CM

## 2014-03-11 DIAGNOSIS — Z23 Encounter for immunization: Secondary | ICD-10-CM

## 2014-03-11 DIAGNOSIS — Z91199 Patient's noncompliance with other medical treatment and regimen due to unspecified reason: Secondary | ICD-10-CM

## 2014-03-11 DIAGNOSIS — L02419 Cutaneous abscess of limb, unspecified: Secondary | ICD-10-CM

## 2014-03-11 DIAGNOSIS — Z9119 Patient's noncompliance with other medical treatment and regimen: Secondary | ICD-10-CM

## 2014-03-11 DIAGNOSIS — I10 Essential (primary) hypertension: Secondary | ICD-10-CM

## 2014-03-11 DIAGNOSIS — E1169 Type 2 diabetes mellitus with other specified complication: Secondary | ICD-10-CM

## 2014-03-11 LAB — POCT GLYCOSYLATED HEMOGLOBIN (HGB A1C): Hemoglobin A1C: 7.8

## 2014-03-11 MED ORDER — AMLODIPINE BESYLATE 10 MG PO TABS: ORAL_TABLET | ORAL | Status: DC

## 2014-03-11 MED ORDER — DOXYCYCLINE HYCLATE 100 MG PO TABS: 100.0000 mg | ORAL_TABLET | Freq: Two times a day (BID) | ORAL | Status: DC

## 2014-03-11 MED ORDER — ROSUVASTATIN CALCIUM 20 MG PO TABS: 20.0000 mg | ORAL_TABLET | Freq: Every day | ORAL | Status: DC

## 2014-03-11 MED ORDER — PIOGLITAZONE HCL-METFORMIN HCL 15-500 MG PO TABS: ORAL_TABLET | ORAL | Status: DC

## 2014-03-11 MED ORDER — CARVEDILOL 12.5 MG PO TABS
12.5000 mg | ORAL_TABLET | Freq: Two times a day (BID) | ORAL | Status: DC
Start: 2014-03-11 — End: 2015-05-08

## 2014-03-11 MED ORDER — LOSARTAN POTASSIUM-HCTZ 100-12.5 MG PO TABS: ORAL_TABLET | ORAL | Status: DC

## 2014-03-11 NOTE — Progress Notes (Signed)
   Subjective:    Patient ID: DEMPSY Taylor, male    DOB: 10-17-1970, 43 y.o.   MRN: [id number]  HPI He is here for a diabetes check. He does have difficulty with his left lateral calf that started 2 days ago. He noted the onset of pain, swelling and somewhat slight drainage in an area that he injured approximately 5 years ago. He piece of furniture cut his leg there. He did get sutures applied. The chair was plastic. He does check his blood sugars usually in the morning and states they run around 150. He gets exercise at work. He does state that work has interfered with him taking good care of himself. His diet is unchanged. He has been to the nutritionist but has not started any new dietary programs. Does check his feet regularly. His last eye exam is in the chart.   Review of Systems     Objective:   Physical Exam Alert and in no distress. Hemoglobin A1c is 7.8. Exam of the left calf does show some slight erythema warmth and tenderness to the lateral aspect with evidence of a healing lesion.      Assessment & Plan:  Cellulitis and abscess of leg - Plan: doxycycline (VIBRA-TABS) 100 MG tablet  Need for prophylactic vaccination and inoculation against influenza - Plan: Flu Vaccine QUAD 36+ mos IM  Type 2 diabetes mellitus without complication - Plan: POCT glycosylated hemoglobin (Hb A1C), CBC with Differential, Comprehensive metabolic panel, Lipid panel, pioglitazone-metformin (ACTOPLUS MET) 15-500 MG per tablet  Hypertension associated with diabetes - Plan: CBC with Differential, Comprehensive metabolic panel, carvedilol (COREG) 12.5 MG tablet, losartan-hydrochlorothiazide (HYZAAR) 100-12.5 MG per tablet, amLODipine (NORVASC) 10 MG tablet  Morbid obesity - Plan: CBC with Differential, Comprehensive metabolic panel, Lipid panel  Hyperlipidemia LDL goal <70 - Plan: Lipid panel, rosuvastatin (CRESTOR) 20 MG tablet  Hypogonadism male - Plan: Testosterone  Personal history of  noncompliance with medical treatment, presenting hazards to health  Recommend heat 3 times per day for the calf area. Explained that this could easily be a foreign body from his previous injury to the area that is working its way loose. I will valuate is later in week if he has more difficulty. Also discussed at length his lack of compliance with his overall care for his diabetes. Again stressed the need for him to be as active as possible and make dietary changes. Explained that if his A1c continues to go up, we will need to become more aggressive with more medications.

## 2014-03-11 NOTE — Telephone Encounter (Signed)
When pt was checking out he states he needs all his meds except Testim reordered to [facility] on [address]

## 2014-03-11 NOTE — Patient Instructions (Signed)
Heat to the area for 20 minutes 3 or 4 times per day. If it starts to drain, come back

## 2014-03-12 LAB — COMPREHENSIVE METABOLIC PANEL
ALT: 16 U/L (ref 0–53)
AST: 17 U/L (ref 0–37)
Albumin: 4.6 g/dL (ref 3.5–5.2)
Alkaline Phosphatase: 70 U/L (ref 39–117)
BILIRUBIN TOTAL: 0.8 mg/dL (ref 0.2–1.2)
BUN: 14 mg/dL (ref 6–23)
CO2: 33 meq/L — AB (ref 19–32)
CREATININE: 1.15 mg/dL (ref 0.50–1.35)
Calcium: 9.8 mg/dL (ref 8.4–10.5)
Chloride: 100 mEq/L (ref 96–112)
GLUCOSE: 181 mg/dL — AB (ref 70–99)
Potassium: 4.5 mEq/L (ref 3.5–5.3)
Sodium: 138 mEq/L (ref 135–145)
Total Protein: 7.5 g/dL (ref 6.0–8.3)

## 2014-03-12 LAB — CBC WITH DIFFERENTIAL/PLATELET
Basophils Absolute: 0 10*3/uL (ref 0.0–0.1)
Basophils Relative: 0 % (ref 0–1)
EOS ABS: 0.4 10*3/uL (ref 0.0–0.7)
EOS PCT: 7 % — AB (ref 0–5)
HCT: 46 % (ref 39.0–52.0)
Hemoglobin: 15.5 g/dL (ref 13.0–17.0)
LYMPHS ABS: 1.4 10*3/uL (ref 0.7–4.0)
Lymphocytes Relative: 28 % (ref 12–46)
MCH: 28.3 pg (ref 26.0–34.0)
MCHC: 33.7 g/dL (ref 30.0–36.0)
MCV: 83.9 fL (ref 78.0–100.0)
Monocytes Absolute: 0.4 10*3/uL (ref 0.1–1.0)
Monocytes Relative: 7 % (ref 3–12)
Neutro Abs: 2.9 10*3/uL (ref 1.7–7.7)
Neutrophils Relative %: 58 % (ref 43–77)
PLATELETS: 237 10*3/uL (ref 150–400)
RBC: 5.48 MIL/uL (ref 4.22–5.81)
RDW: 13.9 % (ref 11.5–15.5)
WBC: 5 10*3/uL (ref 4.0–10.5)

## 2014-03-12 LAB — LIPID PANEL
CHOL/HDL RATIO: 3.9 ratio
CHOLESTEROL: 193 mg/dL (ref 0–200)
HDL: 50 mg/dL (ref >39)
LDL Cholesterol: 121 mg/dL — ABNORMAL HIGH (ref 0–99)
TRIGLYCERIDES: 112 mg/dL (ref <150)
VLDL: 22 mg/dL (ref 0–40)

## 2014-03-12 LAB — TESTOSTERONE: Testosterone: 212 ng/dL — ABNORMAL LOW (ref 300–890)

## 2014-03-18 ENCOUNTER — Other Ambulatory Visit: Payer: Self-pay

## 2014-03-18 ENCOUNTER — Telehealth: Payer: Self-pay | Admitting: Family Medicine

## 2014-03-18 MED ORDER — TESTOSTERONE 50 MG/5GM (1%) TD GEL: TRANSDERMAL | Status: DC

## 2014-03-18 MED ORDER — ROSUVASTATIN CALCIUM 40 MG PO TABS: 40.0000 mg | ORAL_TABLET | Freq: Every day | ORAL | Status: DC

## 2014-03-18 NOTE — Telephone Encounter (Signed)
Pt was given lab results.  He states he has been taking his Crestor daily so I explained that would be increased to 11m & Testim to 3 tubes per JElynotes.   Please send in both medication changes of Crestor to [facility] Testim to 3 tubes daily.

## 2014-03-18 NOTE — Telephone Encounter (Signed)
Call in the Testim

## 2014-03-18 NOTE — Telephone Encounter (Signed)
Called in testum per jcl 3 tubs daily

## 2014-03-18 NOTE — Telephone Encounter (Signed)
done

## 2014-03-21 ENCOUNTER — Telehealth: Payer: Self-pay | Admitting: Family Medicine

## 2014-03-21 ENCOUNTER — Other Ambulatory Visit: Payer: Self-pay

## 2014-03-21 NOTE — Telephone Encounter (Signed)
I have called the patients pharmacy this was a prior auth she said she would fax that to [place]

## 2014-03-21 NOTE — Telephone Encounter (Signed)
[facility] fax refil request  Testosterone 1% gel 30x20m Qty  450 Last refill  03/20/14

## 2014-03-21 NOTE — Telephone Encounter (Signed)
Look at your last note

## 2014-03-21 NOTE — Telephone Encounter (Signed)
i called in testium not androgel

## 2014-03-29 ENCOUNTER — Telehealth: Payer: Self-pay | Admitting: Family Medicine

## 2014-03-30 ENCOUNTER — Emergency Department (INDEPENDENT_AMBULATORY_CARE_PROVIDER_SITE_OTHER)
Admission: EM | Admit: 2014-03-30 | Discharge: 2014-03-30 | Disposition: A | Discharge status: Nursing Home (Includes ICF) | Payer: 59 | Source: Home / Self Care | Attending: Family Medicine | Admitting: Family Medicine

## 2014-03-30 ENCOUNTER — Encounter (HOSPITAL_COMMUNITY): Payer: Self-pay | Admitting: Emergency Medicine

## 2014-03-30 ENCOUNTER — Emergency Department (HOSPITAL_COMMUNITY)
Admission: EM | Admit: 2014-03-30 | Discharge: 2014-03-30 | Disposition: A | Discharge status: Home / Self Care | Payer: 59 | Attending: Emergency Medicine | Admitting: Emergency Medicine

## 2014-03-30 ENCOUNTER — Emergency Department (HOSPITAL_COMMUNITY): Payer: 59

## 2014-03-30 DIAGNOSIS — Z87828 Personal history of other (healed) physical injury and trauma: Secondary | ICD-10-CM | POA: Insufficient documentation

## 2014-03-30 DIAGNOSIS — Z792 Long term (current) use of antibiotics: Secondary | ICD-10-CM | POA: Diagnosis not present

## 2014-03-30 DIAGNOSIS — E119 Type 2 diabetes mellitus without complications: Secondary | ICD-10-CM | POA: Diagnosis not present

## 2014-03-30 DIAGNOSIS — Z79899 Other long term (current) drug therapy: Secondary | ICD-10-CM | POA: Diagnosis not present

## 2014-03-30 DIAGNOSIS — E291 Testicular hypofunction: Secondary | ICD-10-CM | POA: Diagnosis not present

## 2014-03-30 DIAGNOSIS — Z87442 Personal history of urinary calculi: Secondary | ICD-10-CM | POA: Diagnosis not present

## 2014-03-30 DIAGNOSIS — J45909 Unspecified asthma, uncomplicated: Secondary | ICD-10-CM | POA: Insufficient documentation

## 2014-03-30 DIAGNOSIS — E669 Obesity, unspecified: Secondary | ICD-10-CM | POA: Diagnosis not present

## 2014-03-30 DIAGNOSIS — L03116 Cellulitis of left lower limb: Secondary | ICD-10-CM

## 2014-03-30 DIAGNOSIS — M79662 Pain in left lower leg: Secondary | ICD-10-CM | POA: Diagnosis present

## 2014-03-30 DIAGNOSIS — E11628 Type 2 diabetes mellitus with other skin complications: Secondary | ICD-10-CM

## 2014-03-30 DIAGNOSIS — I10 Essential (primary) hypertension: Secondary | ICD-10-CM | POA: Diagnosis not present

## 2014-03-30 DIAGNOSIS — L0291 Cutaneous abscess, unspecified: Secondary | ICD-10-CM

## 2014-03-30 MED ORDER — SULFAMETHOXAZOLE-TRIMETHOPRIM 800-160 MG PO TABS
1.0000 | ORAL_TABLET | Freq: Once | ORAL | Status: AC
  Administered 2014-03-30: 1 via ORAL
  Filled 2014-03-30: qty 1

## 2014-03-30 MED ORDER — SULFAMETHOXAZOLE-TRIMETHOPRIM 800-160 MG PO TABS: 1.0000 | ORAL_TABLET | Freq: Two times a day (BID) | ORAL | Status: DC

## 2014-03-30 MED ORDER — TRAMADOL HCL 50 MG PO TABS
50.0000 mg | ORAL_TABLET | Freq: Once | ORAL | Status: AC
  Administered 2014-03-30: 50 mg via ORAL
  Filled 2014-03-30: qty 1

## 2014-03-30 MED ORDER — TRAMADOL HCL 50 MG PO TABS: 50.0000 mg | ORAL_TABLET | Freq: Four times a day (QID) | ORAL | Status: DC | PRN

## 2014-03-30 MED ORDER — CEPHALEXIN 250 MG PO CAPS
500.0000 mg | ORAL_CAPSULE | Freq: Once | ORAL | Status: AC
  Administered 2014-03-30: 500 mg via ORAL
  Filled 2014-03-30: qty 2

## 2014-03-30 MED ORDER — CEPHALEXIN 500 MG PO CAPS: 500.0000 mg | ORAL_CAPSULE | Freq: Two times a day (BID) | ORAL | Status: DC

## 2014-03-30 NOTE — ED Provider Notes (Signed)
CSN: [id number]     Arrival date & time 03/30/14  1540 History   First MD Initiated Contact with Patient 03/30/14 1559     Chief Complaint  Patient presents with  . Leg Pain     (Consider location/radiation/quality/duration/timing/severity/associated sxs/prior Treatment) HPI  Patient presents with concern of a left mid distal lower extremity lesion. Patient has been recurrent for at least 2 years after an initial traumatic event, the patient recalls as being concerning for a retained foreign body. Over the past months he ulcerated wound has appeared, with indurated skin, increasing pain. Over the past days though it has become erythematous, increasingly more painful, with no relief from anything. Patient saw his physician 2 weeks ago, completed a course of doxycycline, in the interim. Patient denies other current physical complaints.  Past Medical History  Diagnosis Date  . Hypertension   . Obesity   . Hypogonadism male   . Allergic rhinitis   . Diabetes mellitus   . Asthma   . Stones in the urinary tract   . Meniscus tear     rt knee   Past Surgical History  Procedure Laterality Date  . Knee arthroscopy  02/13/2012    Procedure: ARTHROSCOPY KNEE;  Surgeon: Alta Corning, MD;  Location: [place];  Service: Orthopedics;  Laterality: Right;   Family History  Problem Relation Age of Onset  . Diabetes Mother    History  Substance Use Topics  . Smoking status: Never Smoker   . Smokeless tobacco: Never Used  . Alcohol Use: No    Review of Systems  Constitutional:       Per HPI, otherwise negative  HENT:       Per HPI, otherwise negative  Respiratory:       Per HPI, otherwise negative  Cardiovascular:       Per HPI, otherwise negative  Gastrointestinal: Negative for vomiting.  Endocrine:       Negative aside from HPI  Genitourinary:       Neg aside from HPI   Musculoskeletal:       Per HPI, otherwise negative  Skin: Positive for wound.  Neurological: Negative for  syncope.      Allergies  Lisinopril  Home Medications   Prior to Admission medications   Medication Sig Start Date End Date Taking? Authorizing Provider  amLODipine (NORVASC) 10 MG tablet TAKE 1 TABLET BY MOUTH EVERY DAY Patient taking differently: Take 10 mg by mouth daily. TAKE 1 TABLET BY MOUTH EVERY DAY 03/11/14  Yes Denita Lung, MD  carvedilol (COREG) 12.5 MG tablet Take 1 tablet (12.5 mg total) by mouth 2 (two) times daily with a meal. 03/11/14  Yes Denita Lung, MD  losartan-hydrochlorothiazide (HYZAAR) 100-12.5 MG per tablet TAKE 1 [place] Patient taking differently: Take 1 tablet by mouth daily. TAKE 1 TABLET BY MOUTH EVERY DAY 03/11/14  Yes Denita Lung, MD  pioglitazone-metformin (ACTOPLUS MET) 15-500 MG per tablet TAKE 1 TABLET BY MOUTH TWICE DAILY Patient taking differently: Take 1 tablet by mouth daily. TAKE 1 TABLET BY MOUTH TWICE DAILY 03/11/14  Yes Denita Lung, MD  rosuvastatin (CRESTOR) 40 MG tablet Take 1 tablet (40 mg total) by mouth daily. 03/18/14  Yes Denita Lung, MD  testosterone (TESTIM) 50 MG/5GM (1%) GEL Use 3 tubes per day 03/18/14  Yes Denita Lung, MD  doxycycline (VIBRA-TABS) 100 MG tablet Take 1 tablet (100 mg total) by mouth 2 (two) times daily. 03/11/14  Denita Lung, MD   BP 106/87 mmHg  Pulse 88  Temp(Src) 98 F (36.7 C) (Oral)  Resp 18  Ht _0  (1.981 m)  Wt 430 lb (195.047 kg)  BMI 49.70 kg/m2  SpO2 98% Physical Exam  Constitutional: He is oriented to person, place, and time. He appears well-developed. No distress.  HENT:  Head: Normocephalic and atraumatic.  Eyes: Conjunctivae and EOM are normal.  Cardiovascular: Normal rate, regular rhythm and intact distal pulses.   Pulmonary/Chest: Effort normal. No stridor. No respiratory distress.  Abdominal: He exhibits no distension.  Musculoskeletal: He exhibits no edema.  Neurological: He is alert and oriented to person, place, and time.  Skin: Skin is warm  and dry.     Psychiatric: He has a normal mood and affect.  Nursing note and vitals reviewed.   ED Course  Procedures (including critical care time) With concern for retained FB versus abscess, [place] was performed.  I read the UC note.   6:41 PM Patient HD stable. [place] results reviewed with family. Though there is some mention of follow-up x-ray, with no visible foreign body on ultrasound, and the patient's description of a wood or plastic chair, ultrasound is more sensitive for that type of foreign body, and given the condition of the wound, additional infection control is reasonable first, with consideration of possible retained foreign body later.   MDM   Final diagnoses:  Abscess    Patient presents with nonhealing wound, now with increasing pain, erythema concerning for cellulitis with subcutaneous abscess.  Nothing amenable to drainage. Patient is afebrile, hemodynamically stable. She was started on antibiotics, discharged to follow up with primary care, wound care.     Carmin Muskrat, MD 03/30/14 [phone]

## 2014-03-30 NOTE — ED Provider Notes (Signed)
CSN: [id number]     Arrival date & time 03/30/14  1350 History   First MD Initiated Contact with Patient 03/30/14 1427     Chief Complaint  Patient presents with  . Wound Infection   (Consider location/radiation/quality/duration/timing/severity/associated sxs/prior Treatment) HPI Comments: 43 year old morbidly obese male with type 2 diabetes mellitus describes a wound to the left lower leg that developed in 2012 after a piece of plastic punctured the lateral aspect. It never completely healed. Over the past 2 months the wound is gradually being larger cyst drainage. In the past couple of weeks there has been an increase in pain, tenderness, redness and swelling to the left lower leg. He saw his doctor approximately 2 weeks ago and was treated with doxycycline. There has been no improvement.   Past Medical History  Diagnosis Date  . Hypertension   . Obesity   . Hypogonadism male   . Allergic rhinitis   . Diabetes mellitus   . Asthma   . Stones in the urinary tract   . Meniscus tear     rt knee   Past Surgical History  Procedure Laterality Date  . Knee arthroscopy  02/13/2012    Procedure: ARTHROSCOPY KNEE;  Surgeon: Alta Corning, MD;  Location: [place];  Service: Orthopedics;  Laterality: Right;   Family History  Problem Relation Age of Onset  . Diabetes Mother    History  Substance Use Topics  . Smoking status: Never Smoker   . Smokeless tobacco: Never Used  . Alcohol Use: No    Review of Systems  Constitutional: Positive for activity change. Negative for fever.  HENT: Negative.   Respiratory: Negative for cough and shortness of breath.   Cardiovascular: Positive for leg swelling. Negative for chest pain.       Bilateral lower extremities 1+ pitting edema  Skin: Positive for color change and wound.    Allergies  Lisinopril  Home Medications   Prior to Admission medications   Medication Sig Start Date End Date Taking? Authorizing Provider  amLODipine (NORVASC) 10  MG tablet TAKE 1 TABLET BY MOUTH EVERY DAY 03/11/14  Yes Denita Lung, MD  carvedilol (COREG) 12.5 MG tablet Take 1 tablet (12.5 mg total) by mouth 2 (two) times daily with a meal. 03/11/14  Yes Denita Lung, MD  losartan-hydrochlorothiazide (HYZAAR) 100-12.5 MG per tablet TAKE 1 TABLET BY MOUTH EVERY DAY 03/11/14  Yes Denita Lung, MD  pioglitazone-metformin (ACTOPLUS MET) 15-500 MG per tablet TAKE 1 TABLET BY MOUTH TWICE DAILY 03/11/14  Yes Denita Lung, MD  rosuvastatin (CRESTOR) 40 MG tablet Take 1 tablet (40 mg total) by mouth daily. 03/18/14  Yes Denita Lung, MD  testosterone (TESTIM) 50 MG/5GM (1%) GEL Use 3 tubes per day 03/18/14  Yes Denita Lung, MD  doxycycline (VIBRA-TABS) 100 MG tablet Take 1 tablet (100 mg total) by mouth 2 (two) times daily. 03/11/14   Denita Lung, MD   BP 157/93 mmHg  Pulse 80  Temp(Src) 97.6 F (36.4 C) (Oral)  Resp 20  SpO2 98% Physical Exam  Constitutional: He is oriented to person, place, and time. He appears well-developed. No distress.  Pulmonary/Chest: Effort normal. No respiratory distress.  Musculoskeletal: He exhibits edema and tenderness.  There is a crusting lesion to the left lower leg on the lateral aspect approximately 4 x 8 cm. Surrounding this lesion is a  progressing area of erythema, induration, swelling and tenderness.   Neurological: He is alert and oriented  to person, place, and time. He exhibits normal muscle tone.  Skin: Skin is warm and dry. There is erythema.  Nursing note and vitals reviewed.   ED Course  Procedures (including critical care time) Labs Review Labs Reviewed - No data to display  Imaging Review No results found.   MDM   1. Cellulitis of left lower leg   2. Type 2 diabetes mellitus with other skin complications      Morbidly obese male with T2DM and a nonhealing wound to the Left lower leg is being transferred via shuttle to the [facility] ED for cellulitis failing OP tx with ABX.     Janne Napoleon, NP 03/30/14 1452  Janne Napoleon, NP 03/30/14 1455

## 2014-03-30 NOTE — ED Notes (Signed)
Reports left lower leg infection x 2 months and gradually getting worse.  Pt states that are started off as a small pimple and has gradually gotten worse.  Area is red and swollen and warm to touch.

## 2014-03-30 NOTE — Discharge Instructions (Signed)
Cellulitis Go directly to the [facility] Emergency Department for treatment Cellulitis is an infection of the skin and the tissue beneath it. The infected area is usually red and tender. Cellulitis occurs most often in the arms and lower legs.  CAUSES  Cellulitis is caused by bacteria that enter the skin through cracks or cuts in the skin. The most common types of bacteria that cause cellulitis are staphylococci and streptococci. SIGNS AND SYMPTOMS   Redness and warmth.  Swelling.  Tenderness or pain.  Fever. DIAGNOSIS  Your health care provider can usually determine what is wrong based on a physical exam. Blood tests may also be done. TREATMENT  Treatment usually involves taking an antibiotic medicine. HOME CARE INSTRUCTIONS   Take your antibiotic medicine as directed by your health care provider. Finish the antibiotic even if you start to feel better.  Keep the infected arm or leg elevated to reduce swelling.  Apply a warm cloth to the affected area up to 4 times per day to relieve pain.  Take medicines only as directed by your health care provider.  Keep all follow-up visits as directed by your health care provider. SEEK MEDICAL CARE IF:   You notice red streaks coming from the infected area.  Your red area gets larger or turns dark in color.  Your bone or joint underneath the infected area becomes painful after the skin has healed.  Your infection returns in the same area or another area.  You notice a swollen bump in the infected area.  You develop new symptoms.  You have a fever. SEEK IMMEDIATE MEDICAL CARE IF:   You feel very sleepy.  You develop vomiting or diarrhea.  You have a general ill feeling (malaise) with muscle aches and pains. MAKE SURE YOU:   Understand these instructions.  Will watch your condition.  Will get help right away if you are not doing well or get worse. Document Released: 02/16/2005 Document Revised: 09/23/2013 Document Reviewed:  07/25/2011 [facility] Patient Information 2015 [place], [place]. This information is not intended to replace advice given to you by your health care provider. Make sure you discuss any questions you have with your health care provider.

## 2014-03-30 NOTE — ED Notes (Signed)
Pt sent from urgent care for further eval of left leg wound noticed about 2 months ago. Pt reports had sore about 4 years ago but has since gained a lot of weight. Area red swollen and warm to touch.

## 2014-03-30 NOTE — Discharge Instructions (Signed)
As discussed, your evaluation today has been largely reassuring.  But, it is important that you monitor your condition carefully, and do not hesitate to return to the ED if you develop new, or concerning changes in your condition.  Otherwise, please follow-up with your physician for appropriate ongoing care.   Cellulitis Cellulitis is an infection of the skin and the tissue beneath it. The infected area is usually red and tender. Cellulitis occurs most often in the arms and lower legs.  CAUSES  Cellulitis is caused by bacteria that enter the skin through cracks or cuts in the skin. The most common types of bacteria that cause cellulitis are staphylococci and streptococci. SIGNS AND SYMPTOMS   Redness and warmth.  Swelling.  Tenderness or pain.  Fever. DIAGNOSIS  Your health care provider can usually determine what is wrong based on a physical exam. Blood tests may also be done. TREATMENT  Treatment usually involves taking an antibiotic medicine. HOME CARE INSTRUCTIONS   Take your antibiotic medicine as directed by your health care provider. Finish the antibiotic even if you start to feel better.  Keep the infected arm or leg elevated to reduce swelling.  Apply a warm cloth to the affected area up to 4 times per day to relieve pain.  Take medicines only as directed by your health care provider.  Keep all follow-up visits as directed by your health care provider. SEEK MEDICAL CARE IF:   You notice red streaks coming from the infected area.  Your red area gets larger or turns dark in color.  Your bone or joint underneath the infected area becomes painful after the skin has healed.  Your infection returns in the same area or another area.  You notice a swollen bump in the infected area.  You develop new symptoms.  You have a fever. SEEK IMMEDIATE MEDICAL CARE IF:   You feel very sleepy.  You develop vomiting or diarrhea.  You have a general ill feeling (malaise) with  muscle aches and pains. MAKE SURE YOU:   Understand these instructions.  Will watch your condition.  Will get help right away if you are not doing well or get worse. Document Released: 02/16/2005 Document Revised: 09/23/2013 Document Reviewed: 07/25/2011 [facility] Patient Information 2015 [place], [place]. This information is not intended to replace advice given to you by your health care provider. Make sure you discuss any questions you have with your health care provider.  Abscess Care After An abscess (also called a boil or furuncle) is an infected area that contains a collection of pus. Signs and symptoms of an abscess include pain, tenderness, redness, or hardness, or you may feel a moveable soft area under your skin. An abscess can occur anywhere in the body. The infection may spread to surrounding tissues causing cellulitis. A cut (incision) by the surgeon was made over your abscess and the pus was drained out. Gauze may have been packed into the space to provide a drain that will allow the cavity to heal from the inside outwards. The boil may be painful for 5 to 7 days. Most people with a boil do not have high fevers. Your abscess, if seen early, may not have localized, and may not have been lanced. If not, another appointment may be required for this if it does not get better on its own or with medications. HOME CARE INSTRUCTIONS   Only take over-the-counter or prescription medicines for pain, discomfort, or fever as directed by your caregiver.  When you bathe,  soak and then remove gauze or iodoform packs at least daily or as directed by your caregiver. You may then wash the wound gently with mild soapy water. Repack with gauze or do as your caregiver directs. SEEK IMMEDIATE MEDICAL CARE IF:   You develop increased pain, swelling, redness, drainage, or bleeding in the wound site.  You develop signs of generalized infection including muscle aches, chills, fever, or a general ill  feeling.  An oral temperature above 102 F (38.9 C) develops, not controlled by medication. See your caregiver for a recheck if you develop any of the symptoms described above. If medications (antibiotics) were prescribed, take them as directed. Document Released: 11/25/2004 Document Revised: 08/01/2011 Document Reviewed: 07/23/2007 [facility] Patient Information 2015 [place], [place]. This information is not intended to replace advice given to you by your health care provider. Make sure you discuss any questions you have with your health care provider.

## 2014-04-09 NOTE — Telephone Encounter (Signed)
Gave pt all info & is just recently also got on his wife's ins, so he will call pharmacy & see what cost is with his wife's ins.

## 2014-04-09 NOTE — Telephone Encounter (Signed)
P.A. Testim approved but cost is $54 with insurance due to high deductible.  Did ask pharmacist about cost of generic self pay is $400.  Left message for pt

## 2014-04-13 ENCOUNTER — Other Ambulatory Visit: Payer: Self-pay | Admitting: Family Medicine

## 2014-04-14 ENCOUNTER — Ambulatory Visit: Payer: 59 | Admitting: Family Medicine

## 2014-04-15 MED ORDER — TESTOSTERONE 50 MG/5GM (1%) TD GEL: TRANSDERMAL | Status: DC

## 2014-04-15 NOTE — Telephone Encounter (Signed)
Called pharmacy Testim (brand name only) goes thru wife's ins with $30 co pay, pt informed

## 2014-04-22 ENCOUNTER — Encounter: Payer: Self-pay | Admitting: Family Medicine

## 2014-04-22 ENCOUNTER — Ambulatory Visit (INDEPENDENT_AMBULATORY_CARE_PROVIDER_SITE_OTHER): Payer: 59 | Admitting: Family Medicine

## 2014-04-22 VITALS — BP 130/90 | HR 73 | Ht 78.0 in | Wt >= 6400 oz

## 2014-04-22 DIAGNOSIS — I152 Hypertension secondary to endocrine disorders: Secondary | ICD-10-CM

## 2014-04-22 DIAGNOSIS — E291 Testicular hypofunction: Secondary | ICD-10-CM

## 2014-04-22 DIAGNOSIS — Z0000 Encounter for general adult medical examination without abnormal findings: Secondary | ICD-10-CM

## 2014-04-22 DIAGNOSIS — E1169 Type 2 diabetes mellitus with other specified complication: Secondary | ICD-10-CM

## 2014-04-22 DIAGNOSIS — I10 Essential (primary) hypertension: Secondary | ICD-10-CM

## 2014-04-22 DIAGNOSIS — E785 Hyperlipidemia, unspecified: Secondary | ICD-10-CM

## 2014-04-22 DIAGNOSIS — E1159 Type 2 diabetes mellitus with other circulatory complications: Secondary | ICD-10-CM

## 2014-04-22 DIAGNOSIS — E119 Type 2 diabetes mellitus without complications: Secondary | ICD-10-CM

## 2014-04-22 LAB — POCT URINALYSIS DIPSTICK
Bilirubin, UA: NEGATIVE
Glucose, UA: NEGATIVE
Ketones, UA: NEGATIVE
Leukocytes, UA: NEGATIVE
Nitrite, UA: NEGATIVE
PH UA: 6.5
Protein, UA: NEGATIVE
RBC UA: NEGATIVE
Spec Grav, UA: 1.015
UROBILINOGEN UA: NEGATIVE

## 2014-04-22 NOTE — Progress Notes (Signed)
   Subjective:    Patient ID: Guy Taylor, male    DOB: [birth year], 43 y.o.   MRN: [id number]  HPI He is here for complete examination. He does have an underlying history of diabetes. He has not had an eye exam recently. He admits to not making any changes in his diet and exercise regimen. He blames his heavy workload for this. He did check his blood sugars periodically as well as his feet. He continues on medications listed in the chart. Smoking and drinking were reviewed. He does have a 53-year-old daughter as well as another teenager. He presently is not using Testim because of the odor. He did have an infectious process to the left lateral calf that is slowly healing. Family and social history was reviewed.   Review of Systems  All other systems reviewed and are negative.      Objective:   Physical Exam BP 130/90 mmHg  Pulse 73  Ht 6' 6"  (1.981 m)  Wt 438 lb (198.675 kg)  BMI 50.63 kg/m2  SpO2 96%  General Appearance:    Alert, cooperative, no distress, appears stated age  Head:    Normocephalic, without obvious abnormality, atraumatic  Eyes:    PERRL, conjunctiva/corneas clear, EOM's intact.    benign  Ears:    Normal TM's and external ear canals  Nose:   Nares normal, mucosa normal, no drainage or sinus   tenderness  Throat:   Lips, mucosa, and tongue normal; teeth and gums normal  Neck:   Supple, no lymphadenopathy;  thyroid:  no   enlargement/tenderness/nodules; no carotid   bruit or JVD  Back:    Spine nontender, no curvature, ROM normal, no CVA     tenderness  Lungs:     Clear to auscultation bilaterally without wheezes, rales or     ronchi; respirations unlabored  Chest Wall:    No tenderness or deformity   Heart:    Regular rate and rhythm, S1 and S2 normal, no murmur, rub   or gallop  Breast Exam:    No chest wall tenderness, masses or gynecomastia  Abdomen:     Soft, non-tender,  normoactive bowel sounds,    no masses, no hepatosplenomegaly however difficult to  truly assess due to his large size         Extremities:   No clubbing, cyanosis or edema  Pulses:   2+ and symmetric all extremities  Skin:   Skin color, texture, turgor normal, no rashes or lesions  Lymph nodes:   Cervical, supraclavicular, and axillary nodes normal  Neurologic:   CNII-XII intact, normal strength, sensation and gait; reflexes 2+ and symmetric throughout          Psych:   Normal mood, affect, hygiene and grooming.          Assessment & Plan:  Routine general medical examination at a health care facility - Plan: POCT Urinalysis Dipstick  Type 2 diabetes mellitus without complication - Plan: Ambulatory referral to Ophthalmology  Hyperlipidemia LDL goal <70  Hypertension associated with diabetes - Plan: POCT Urinalysis Dipstick  Hypogonadism male - Plan: PSA  Morbid obesity  I again stressed the need for him to make diet and exercise changes and not allow work to interfere with him taking care of himself. He is to check with his insurance to see which testosterone replacement  the insurance, we will allow him to use.

## 2014-04-23 LAB — PSA: PSA: 1.52 ng/mL (ref <=4.00)

## 2014-06-04 ENCOUNTER — Ambulatory Visit
Admission: RE | Admit: 2014-06-04 | Discharge: 2014-06-04 | Disposition: A | Discharge status: Home / Self Care | Payer: 59 | Source: Ambulatory Visit | Attending: Family Medicine | Admitting: Family Medicine

## 2014-06-04 ENCOUNTER — Ambulatory Visit (INDEPENDENT_AMBULATORY_CARE_PROVIDER_SITE_OTHER): Payer: 59 | Admitting: Family Medicine

## 2014-06-04 ENCOUNTER — Encounter: Payer: Self-pay | Admitting: Family Medicine

## 2014-06-04 VITALS — Wt >= 6400 oz

## 2014-06-04 DIAGNOSIS — L03116 Cellulitis of left lower limb: Secondary | ICD-10-CM

## 2014-06-04 MED ORDER — DOXYCYCLINE HYCLATE 100 MG PO TABS: 100.0000 mg | ORAL_TABLET | Freq: Two times a day (BID) | ORAL | Status: DC

## 2014-06-04 NOTE — Patient Instructions (Signed)
Keep an antibiotic ointment and dressing on the leg until it dries up.

## 2014-06-04 NOTE — Progress Notes (Signed)
   Subjective:    Patient ID: Guy Taylor, male    DOB: 01/08/1971, 44 y.o.   MRN: [id number]  HPI He is here for evaluation of swelling, pain and drainage from the left lateral. He indicates the original injury was approximately 4 years ago when he tripped and injured the leg with a plastic chair. Most recently he started having difficulty in October and was seen in November. He was placed on an antibiotic and soft tissue x-ray was done which was negative. Since then he has continued to have difficulty and within the last week has noted worsening of the pain in the left lateral calf area as well as drainage. He apparently did pick at the leg and it has been draining.   Review of Systems     Objective:   Physical Exam Left lateral calf is swollen, normal temperature but tender to palpation with a 3 cm area of erythema and slight drainage noted.       Assessment & Plan:  Cellulitis of left lower extremity - Plan: DG Tibia/Fibula Left, doxycycline (VIBRA-TABS) 100 MG tablet  there is question of whether there is a form body causing this. I will get further x-rays to evaluate this and place him on an antibiotic. Recheck here beginning of the week

## 2014-06-09 ENCOUNTER — Ambulatory Visit (INDEPENDENT_AMBULATORY_CARE_PROVIDER_SITE_OTHER): Payer: 59 | Admitting: Family Medicine

## 2014-06-09 DIAGNOSIS — L03116 Cellulitis of left lower limb: Secondary | ICD-10-CM

## 2014-06-09 NOTE — Progress Notes (Signed)
   Subjective:    Patient ID: Guy Taylor, male    DOB: [birth year], 44 y.o.   MRN: [id number]  HPI He is here for a recheck. He states that he has no slightly less swelling but still having a fair amount of pain. On several occasions he has had to sit down due to the excessive pain that he was experiencing.   Review of Systems     Objective:   Physical Exam Exam of the left calf does show slightly less swelling and the area of erythema and drainage has actually diminished in size. It is slightly warm to palpation.       Assessment & Plan:  Cellulitis of left lower extremity  I will keep him on the antibiotic for probably a good month. If his symptoms get worse, he is to call me otherwise I will see him in 2 weeks. He is comfortable with this.

## 2014-06-09 NOTE — Patient Instructions (Signed)
You can take 4 Advil 3 times per day.

## 2014-06-18 ENCOUNTER — Telehealth: Payer: Self-pay | Admitting: Family Medicine

## 2014-06-18 DIAGNOSIS — L03116 Cellulitis of left lower limb: Secondary | ICD-10-CM

## 2014-06-18 MED ORDER — DOXYCYCLINE HYCLATE 100 MG PO TABS: 100.0000 mg | ORAL_TABLET | Freq: Two times a day (BID) | ORAL | Status: DC

## 2014-06-18 NOTE — Telephone Encounter (Signed)
Pt called and stated that he was told to call back when he finished medication. He states JCL wanted him to have another round. Pt uses [facility] on [address] and can be reached at [phone]

## 2014-07-01 ENCOUNTER — Encounter: Payer: Self-pay | Admitting: Family Medicine

## 2014-07-01 ENCOUNTER — Other Ambulatory Visit (INDEPENDENT_AMBULATORY_CARE_PROVIDER_SITE_OTHER): Payer: 59

## 2014-07-01 ENCOUNTER — Ambulatory Visit (INDEPENDENT_AMBULATORY_CARE_PROVIDER_SITE_OTHER): Payer: 59 | Admitting: Family Medicine

## 2014-07-01 VITALS — BP 142/90 | HR 89 | Ht 78.0 in | Wt >= 6400 oz

## 2014-07-01 DIAGNOSIS — L03116 Cellulitis of left lower limb: Secondary | ICD-10-CM | POA: Insufficient documentation

## 2014-07-01 DIAGNOSIS — E119 Type 2 diabetes mellitus without complications: Secondary | ICD-10-CM

## 2014-07-01 DIAGNOSIS — M79605 Pain in left leg: Secondary | ICD-10-CM

## 2014-07-01 DIAGNOSIS — E291 Testicular hypofunction: Secondary | ICD-10-CM

## 2014-07-01 MED ORDER — CLINDAMYCIN HCL 300 MG PO CAPS: 300.0000 mg | ORAL_CAPSULE | Freq: Three times a day (TID) | ORAL | Status: DC

## 2014-07-01 MED ORDER — LEVOFLOXACIN 750 MG PO TABS: 750.0000 mg | ORAL_TABLET | Freq: Every day | ORAL | Status: DC

## 2014-07-01 NOTE — Assessment & Plan Note (Signed)
I would say the patient does have cellulitis and a questionable myositis. There is some changes on ultrasound today. This is likely would patient was seen one month ago on the ultrasound that was taken at the emergency department. Discussed with patient at this time that I do agree with patient's primary care that a change in antibiotic is warranted. I'll discuss with primary care about the clindamycin which I do agree with. In addition to this though I do think because the patient's diabetic status he needs to be double coverage for Pseudomonas. This would include either ciprofloxacin or levofloxacin. We will leave this choice up to primary care provider. In addition of this if patient does not make any significant improvement with the change in one week I would consider either a referral to wound care or MRI for further evaluation. We have discussed with primary care physician on the phone about this care. Patient knows I'm here if he has any questions but he is in good hands with his primary care provider.

## 2014-07-01 NOTE — Addendum Note (Signed)
Addended by: Jill Alexanders C on: 07/01/2014 05:00 PM   Modules accepted: Orders, Medications

## 2014-07-01 NOTE — Progress Notes (Signed)
   Subjective:    Patient ID: Guy Taylor, male    DOB: [birth year], 45 y.o.   MRN: [id number]  HPI He is here for a recheck on his cellulitis. The original injury to the lateral aspect of his calf was approximately 5 years ago and involved a penetrating injury with a plastic chair. He had no difficulty with this until this fall when he noted  spontaneous drainage from that area. He was placed on doxycycline. The pain actually got worse and he was seen in an urgent care center. An ultrasound of there was negative. He was placed on Septra but then switch back to doxycycline. He has been on doxycycline since that time and states that he is only 50% better but still having warmth, erythema and pain. He also has underlying diabetes as well as hypogonadism.   Review of Systems     Objective:   Physical Exam Alert and in no distress. 3 cm dry appearing lesion is noted on the left lateral calf with surrounding erythema and pain on palpation.       Assessment & Plan:  Cellulitis of left lower extremity  Type 2 diabetes mellitus without complication  Hypogonadism male  he has a follow-up appointment concerning his diabetes and testosterone later in the month. I will send him off for ultrasound of the calf to look for underlying abscess and possible foreign body. If none is found, I will then add clindamycin to his regimen.

## 2014-07-01 NOTE — Progress Notes (Signed)
[facility] [address] [place], [place] [address] Phone: [phone] Subjective:    I'm seeing this patient by the request  of:  Wyatt Haste, MD   CC: Left leg pain and swelling  [id number] Guy Taylor is a 44 y.o. male coming in with complaint of left lower leg pain and swelling. Patient states that back in October patient started having significant amount of swelling as well as irritation of the leg. Patient is a diabetic. Patient has been treated by primary care provider with different antibiotics including 2 rounds of doxycycline. Patient states that it does seem to be improving but continues to have significant redness and swelling and is still very uncomfortable. Patient describes more of a dull aching throbbing sensation whenever is sitting for long amount of time. Denies that it seems to be getting worse. There was a concern for potential abscess. Vision was seen in January in the emergency department and there was an ultrasound. Reviewing his ultrasound patient did have a focal region of decrease echogenicity within the subcutaneous fat without communication to the skin surface that could be potentially abscess versus a foreign body. Patient then 5 days later did have x-rays that did not show any foreign body.     Past medical history, social, surgical and family history all reviewed in electronic medical record.   Review of Systems: No headache, visual changes, nausea, vomiting, diarrhea, constipation, dizziness, abdominal pain, skin rash, fevers, chills, night sweats, weight loss, swollen lymph nodes, body aches, joint swelling, muscle aches, chest pain, shortness of breath, mood changes.   Objective Blood pressure 142/90, pulse 89, height 6' 6"  (1.981 m), weight 435 lb (197.315 kg), SpO2 96 %.  General: No apparent distress alert and oriented x3 mood and affect normal, dressed appropriately.  HEENT: Pupils equal, extraocular movements  intact  Respiratory: Patient's speak in full sentences and does not appear short of breath   Skin: Warm dry intact with no signs of infection or rash on extremities or on axial skeleton.  Abdomen: Soft nontender  Neuro: Cranial nerves II through XII are intact, neurovascularly intact in all extremities with 2+ DTRs and 2+ pulses.  Lymph: No lymphadenopathy of posterior or anterior cervical chain or axillae bilaterally.  Gait normal with good balance and coordination.  MSK:  Non tender with full range of motion and good stability and symmetric strength and tone of shoulders, elbows, wrist, hip, knee and ankles bilaterally.   Patient's left leg shows the patient does have swelling compared to the contralateral side. There is some erythema. Patient is tender to palpation. Open wound on the lateral aspect of the shin that does have good granulation tissue and no significant discharge noted today. Patient though is tender surrounding this area. Some mild hemosiderin deposits noted of the skin as well. Neurovascular intact distally. Full range of motion of the ankle and knee. Patient is able to ambulate without any significant antalgic gait.  Limited musculoskeletal ultrasound was performed and interpreted by Hulan Saas, M  Limited ultrasound of patient's area of the cellulitis shows the patient does have significant swelling still of the subcutaneous fat. This is consistent with cellulitis. In addition to this though it does appear the patient does have what appears to be more of a myositis with increasing hypoechoic changes of the underlying muscle. This does not go through full thickness. Muscle affected seems to be the anterior tibialis versus the peroneus longus. This is difficult to assess. No abscess  formation noted specifically.  Impression: Cellulitis with questionable myositis but no true abscess formation noted.   Impression and Recommendations:     This case required medical decision  making of moderate complexity.

## 2014-07-01 NOTE — Progress Notes (Signed)
Pre visit review using our clinic review tool, if applicable. No additional management support is needed unless otherwise documented below in the visit note. 

## 2014-07-01 NOTE — Progress Notes (Addendum)
   Subjective:    Patient ID: Guy Taylor, male    DOB: 22-Nov-1970, 44 y.o.   MRN: [id number]  HPI    Review of Systems     Objective:   Physical Exam        Assessment & Plan:  Dr. Thompson Caul evaluation is appreciated. I will place him on Levaquin and clindamycin and recheck in one week. He will also stop his doxycycline.

## 2014-07-01 NOTE — Progress Notes (Deleted)
   Subjective:    Patient ID: Guy Taylor, male    DOB: [birth year], 44 y.o.   MRN: [id number]  HPI    Review of Systems     Objective:   Physical Exam        Assessment & Plan:

## 2014-07-01 NOTE — Patient Instructions (Signed)
Good to meet you Try to elevate leg.  I will discuss with lalonde and have changes to medicine.  Continue what medications he changes you to.  Will consider trying for  A week with new medicines but if not better consider wound care or MRI.  Try compression socks if you can tolerate

## 2014-07-08 ENCOUNTER — Ambulatory Visit (INDEPENDENT_AMBULATORY_CARE_PROVIDER_SITE_OTHER): Payer: 59 | Admitting: Family Medicine

## 2014-07-08 DIAGNOSIS — E119 Type 2 diabetes mellitus without complications: Secondary | ICD-10-CM

## 2014-07-08 DIAGNOSIS — L03116 Cellulitis of left lower limb: Secondary | ICD-10-CM

## 2014-07-08 MED ORDER — LEVOFLOXACIN 750 MG PO TABS: 750.0000 mg | ORAL_TABLET | Freq: Every day | ORAL | Status: DC

## 2014-07-08 MED ORDER — PIOGLITAZONE HCL-METFORMIN HCL 15-500 MG PO TABS: 1.0000 | ORAL_TABLET | Freq: Two times a day (BID) | ORAL | Status: DC

## 2014-07-08 MED ORDER — CLINDAMYCIN HCL 300 MG PO CAPS: 300.0000 mg | ORAL_CAPSULE | Freq: Three times a day (TID) | ORAL | Status: DC

## 2014-07-08 NOTE — Progress Notes (Signed)
   Subjective:    Patient ID: Guy Taylor, male    DOB: 11/15/1970, 43 y.o.   MRN: [id number]  HPI He is here for a recheck. He does state that the area does seem to be smaller. He is having less pain.   Review of Systems     Objective:   Physical Exam Exam of the left calf does show the lesion to be roughly half the size however he does have some slight erythema in a very definite rectangular pattern.       Assessment & Plan:  Cellulitis of left lower extremity - Plan: clindamycin (CLEOCIN) 300 MG capsule, levofloxacin (LEVAQUIN) 750 MG tablet  Type 2 diabetes mellitus without complication - Plan: pioglitazone-metformin (ACTOPLUS MET) 15-500 MG per tablet  definite improvement is noted. I think he is having a little contact irritation from the bandages that he using and I gave him samples of a text free dressing. Recheck here in 3 weeks. I will also give him Actos as he states he is about to run out.  

## 2014-07-14 ENCOUNTER — Ambulatory Visit: Payer: 59 | Admitting: Family Medicine

## 2014-08-04 ENCOUNTER — Encounter: Payer: Self-pay | Admitting: Family Medicine

## 2014-08-04 ENCOUNTER — Ambulatory Visit (INDEPENDENT_AMBULATORY_CARE_PROVIDER_SITE_OTHER): Payer: 59 | Admitting: Family Medicine

## 2014-08-04 VITALS — BP 140/100 | HR 86 | Wt >= 6400 oz

## 2014-08-04 DIAGNOSIS — I10 Essential (primary) hypertension: Secondary | ICD-10-CM

## 2014-08-04 DIAGNOSIS — E785 Hyperlipidemia, unspecified: Secondary | ICD-10-CM | POA: Diagnosis not present

## 2014-08-04 DIAGNOSIS — E119 Type 2 diabetes mellitus without complications: Secondary | ICD-10-CM

## 2014-08-04 DIAGNOSIS — I152 Hypertension secondary to endocrine disorders: Secondary | ICD-10-CM

## 2014-08-04 DIAGNOSIS — E1159 Type 2 diabetes mellitus with other circulatory complications: Secondary | ICD-10-CM

## 2014-08-04 DIAGNOSIS — L03116 Cellulitis of left lower limb: Secondary | ICD-10-CM

## 2014-08-04 DIAGNOSIS — E291 Testicular hypofunction: Secondary | ICD-10-CM | POA: Diagnosis not present

## 2014-08-04 DIAGNOSIS — E1169 Type 2 diabetes mellitus with other specified complication: Secondary | ICD-10-CM | POA: Diagnosis not present

## 2014-08-04 LAB — POCT GLYCOSYLATED HEMOGLOBIN (HGB A1C): Hemoglobin A1C: 7.1

## 2014-08-04 LAB — POCT UA - MICROALBUMIN
Albumin/Creatinine Ratio, Urine, POC: 32.1
Creatinine, POC: 59.9 mg/dL
Microalbumin Ur, POC: 19.2 mg/L

## 2014-08-04 NOTE — Progress Notes (Signed)
  Subjective:    Patient ID: Guy Taylor, male    DOB: August 20, 1970, 44 y.o.   MRN: [id number]  Guy Taylor is a 44 y.o. male who presents for follow-up of Type 2 diabetes mellitus. He also has a previous history of difficulty with cellulitis but states that it is almost healed.  Home blood sugar records: patient test one time a day but not on regular basis. Current symptoms/problems none Daily foot checks:   Any foot concerns: none he checks his feet regularly. Exercise: not a lot walks about 25 to 30 min a day but not every day EYES: patient did not go to appointment The following portions of the patient's history were reviewed and updated as appropriate: allergies, current medications, past medical history, past social history and problem list.  ROS as in subjective above.     Objective:    Physical Exam Alert and in no distress The patient left the office before the visit was finished. Lower extremity does show much better healing but still a small area of scab is present   Lab Review Diabetic Labs Latest Ref Rng 03/11/2014 02/07/2013 11/12/2012 02/07/2012 07/26/2011  HbA1c - 7.8% 6.8 6.3 - -  Chol 0 - 200 mg/dL 193 - 201(H) - 194  HDL >39 mg/dL 50 - 46 - 48  Calc LDL 0 - 99 mg/dL 121(H) - 124(H) - 132(H)  Triglycerides <150 mg/dL 112 - 156(H) - 70  Creatinine 0.50 - 1.35 mg/dL 1.15 - 1.05 1.23 1.11   BP/Weight 07/01/2014 06/04/2014 04/22/2014 03/30/2014 40/01/8118  Systolic BP 147 - 829 562 130  Diastolic BP 90 - 90 79 93  Wt. (Lbs) 435 430 438 430 -  BMI 50.28 49.7 50.63 49.7 -    Rodrigus  reports that he has never smoked. He has never used smokeless tobacco. He reports that he does not drink alcohol or use illicit drugs. Hemoglobin A1c is 7.1    Assessment & Plan:    Type 2 diabetes mellitus without complication - Plan: POCT glycosylated hemoglobin (Hb A1C), POCT UA - Microalbumin  Cellulitis of left lower extremity  Hypogonadism male - Plan: Testosterone  Hyperlipidemia  LDL goal <70  Hypertension associated with diabetes  Morbid obesity 1.  2. Rx changes:none 3. Education: Reviewed 'ABCs' of diabetes management (respective goals in parentheses):  A1C (<7), blood pressure (<130/80), and cholesterol (LDL <100). 4. Compliance at present is estimated to be adequate.Efforts to improve compliance (if necessary) will be directed at increasing his exercise 5. Follow up: 4 months 6. I again had a long discussion with him concerning diet and exercise and making permanent lifestyle changes. He voiced understanding of this.

## 2014-08-05 LAB — TESTOSTERONE: Testosterone: 191 ng/dL — ABNORMAL LOW (ref 300–890)

## 2014-09-05 ENCOUNTER — Other Ambulatory Visit: Payer: Self-pay | Admitting: Family Medicine

## 2014-09-12 LAB — HM DIABETES EYE EXAM

## 2014-09-23 ENCOUNTER — Encounter: Payer: Self-pay | Admitting: Internal Medicine

## 2014-09-23 ENCOUNTER — Encounter: Payer: Self-pay | Admitting: Family Medicine

## 2014-09-25 ENCOUNTER — Telehealth: Payer: Self-pay | Admitting: Internal Medicine

## 2014-09-25 DIAGNOSIS — I152 Hypertension secondary to endocrine disorders: Secondary | ICD-10-CM

## 2014-09-25 DIAGNOSIS — I10 Essential (primary) hypertension: Principal | ICD-10-CM

## 2014-09-25 DIAGNOSIS — E1159 Type 2 diabetes mellitus with other circulatory complications: Secondary | ICD-10-CM

## 2014-09-25 MED ORDER — LOSARTAN POTASSIUM-HCTZ 100-12.5 MG PO TABS: ORAL_TABLET | ORAL | Status: DC

## 2014-09-25 MED ORDER — CARVEDILOL 12.5 MG PO TABS: 12.5000 mg | ORAL_TABLET | Freq: Two times a day (BID) | ORAL | Status: DC

## 2014-09-25 NOTE — Telephone Encounter (Signed)
See if he is taking the amlodipine and if so call it in for a 90 day supply with 3 refills

## 2014-09-25 NOTE — Telephone Encounter (Signed)
Left message for pt to call me back with info

## 2014-09-25 NOTE — Telephone Encounter (Signed)
Refill request for amlodipine, losartan/hctz and carvedilol to [facility].   Dr. Redmond School i see pt was on amlodipine in the past but not on current med list. Is he still suppose to be on that med

## 2014-09-26 MED ORDER — AMLODIPINE BESYLATE 10 MG PO TABS: 10.0000 mg | ORAL_TABLET | Freq: Every day | ORAL | Status: DC

## 2014-09-26 NOTE — Addendum Note (Signed)
Addended by: Minette Headland A on: 09/26/2014 09:21 AM   Modules accepted: Orders

## 2014-09-26 NOTE — Telephone Encounter (Signed)
Pt is taking amlopidine 76m once daily and i have sent in med to pharmacy

## 2014-09-26 NOTE — Telephone Encounter (Signed)
Left message for pt to call me back 

## 2014-10-08 ENCOUNTER — Ambulatory Visit (INDEPENDENT_AMBULATORY_CARE_PROVIDER_SITE_OTHER): Payer: 59 | Admitting: Family Medicine

## 2014-10-08 ENCOUNTER — Encounter: Payer: Self-pay | Admitting: Family Medicine

## 2014-10-08 VITALS — BP 164/110 | HR 97 | Wt >= 6400 oz

## 2014-10-08 DIAGNOSIS — M7150 Other bursitis, not elsewhere classified, unspecified site: Secondary | ICD-10-CM

## 2014-10-08 DIAGNOSIS — M7050 Other bursitis of knee, unspecified knee: Secondary | ICD-10-CM

## 2014-10-08 NOTE — Progress Notes (Signed)
   Subjective:    Patient ID: Guy Taylor, male    DOB: Apr 30, 1971, 44 y.o.   MRN: [id number]  HPI He complains of difficulty with left knee pain soreness. He has been doing a walk jog regimen and did note soreness medially. Saturday when he got up he experienced tremendous amount of pain and swelling in the medial knee area. No popping, locking or grinding.   Review of Systems     Objective:   Physical Exam Tender to palpation over the past since right bursa. No pain over the joint line. No effusion noted. Negative anterior drawer and McMurray's testing       Assessment & Plan:  Pes anserine bursitis I discussed the diagnosis with him. Elected to inject this. We milligrams of Kenalog and 2 mL of Xylocaine was injected into the bursa without difficulty. He obtained a least 50% relief of his symptoms. I will schedule him to come back in 2 weeks for recheck on his blood pressure.

## 2014-10-08 NOTE — Patient Instructions (Signed)
Take Advil or Aleve for the pain and this should slowly go away.

## 2014-10-14 ENCOUNTER — Telehealth: Payer: Self-pay | Admitting: Family Medicine

## 2014-10-14 NOTE — Telephone Encounter (Signed)
Pt states he came in last week and he knee is actually worse.  The pain is non stop.  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  Pt wants to know what is next step?  Please call [phone]

## 2014-10-14 NOTE — Telephone Encounter (Signed)
Patient is aware of Dr. Lanice Shirts message and the patient states that he will contact the Dr. Parks Ranger did his surgery on his knee. He thinks it was at [facility], ortho.

## 2014-10-14 NOTE — Telephone Encounter (Signed)
Have him see an orthopedic doc

## 2014-10-14 NOTE — Telephone Encounter (Signed)
LMOM TO CB. CLS 

## 2014-10-21 ENCOUNTER — Ambulatory Visit (INDEPENDENT_AMBULATORY_CARE_PROVIDER_SITE_OTHER): Payer: 59 | Admitting: Family Medicine

## 2014-10-21 ENCOUNTER — Encounter: Payer: Self-pay | Admitting: Family Medicine

## 2014-10-21 VITALS — BP 138/90 | HR 80 | Wt >= 6400 oz

## 2014-10-21 DIAGNOSIS — E119 Type 2 diabetes mellitus without complications: Secondary | ICD-10-CM

## 2014-10-21 DIAGNOSIS — E1169 Type 2 diabetes mellitus with other specified complication: Secondary | ICD-10-CM

## 2014-10-21 DIAGNOSIS — I10 Essential (primary) hypertension: Secondary | ICD-10-CM

## 2014-10-21 DIAGNOSIS — I152 Hypertension secondary to endocrine disorders: Secondary | ICD-10-CM

## 2014-10-21 DIAGNOSIS — E1159 Type 2 diabetes mellitus with other circulatory complications: Secondary | ICD-10-CM

## 2014-10-21 NOTE — Progress Notes (Signed)
   Subjective:    Patient ID: Guy Taylor, male    DOB: [birth year], 44 y.o.   MRN: [id number]  HPI He is here for recheck on his blood pressure. At his last visit he was in a lot of pain. His knee has improved however he is now seeing an orthopedic surgeon and an MRI is ordered. He is still having some medial joint pain. He continues to work on his weight loss and let sugars.   Review of Systems     Objective:   Physical Exam Alert and in no distress. Blood pressure is recorded.       Assessment & Plan:  Hypertension associated with diabetes  Type 2 diabetes mellitus without complication  Since he has losing weight and checking his blood sugar, I will make no changes in his present regimen. He is to return here in several months. He plans to have his hemoglobin A1c in the low 6 range by then.

## 2014-10-29 ENCOUNTER — Other Ambulatory Visit (HOSPITAL_COMMUNITY): Payer: Self-pay | Admitting: Orthopedic Surgery

## 2014-10-29 DIAGNOSIS — M25562 Pain in left knee: Secondary | ICD-10-CM

## 2014-11-04 ENCOUNTER — Ambulatory Visit (HOSPITAL_COMMUNITY)
Admission: RE | Admit: 2014-11-04 | Discharge: 2014-11-04 | Disposition: A | Discharge status: Home / Self Care | Payer: 59 | Source: Ambulatory Visit | Attending: Orthopedic Surgery | Admitting: Orthopedic Surgery

## 2014-11-04 DIAGNOSIS — M7052 Other bursitis of knee, left knee: Secondary | ICD-10-CM | POA: Diagnosis not present

## 2014-11-04 DIAGNOSIS — S83242A Other tear of medial meniscus, current injury, left knee, initial encounter: Secondary | ICD-10-CM | POA: Insufficient documentation

## 2014-11-04 DIAGNOSIS — X58XXXA Exposure to other specified factors, initial encounter: Secondary | ICD-10-CM | POA: Diagnosis not present

## 2014-11-04 DIAGNOSIS — M25562 Pain in left knee: Secondary | ICD-10-CM

## 2014-11-04 DIAGNOSIS — M84362A Stress fracture, left tibia, initial encounter for fracture: Secondary | ICD-10-CM | POA: Insufficient documentation

## 2014-11-04 DIAGNOSIS — Y9302 Activity, running: Secondary | ICD-10-CM | POA: Insufficient documentation

## 2014-11-12 ENCOUNTER — Ambulatory Visit (HOSPITAL_COMMUNITY): Payer: 59

## 2014-11-15 ENCOUNTER — Ambulatory Visit (HOSPITAL_COMMUNITY)
Admission: EM | Admit: 2014-11-15 | Discharge: 2014-11-16 | Disposition: A | Discharge status: Home / Self Care | Payer: 59 | Attending: Emergency Medicine | Admitting: Emergency Medicine

## 2014-11-15 ENCOUNTER — Encounter (HOSPITAL_COMMUNITY): Payer: Self-pay | Admitting: *Deleted

## 2014-11-15 ENCOUNTER — Emergency Department (HOSPITAL_COMMUNITY): Payer: 59

## 2014-11-15 DIAGNOSIS — T18128A Food in esophagus causing other injury, initial encounter: Secondary | ICD-10-CM | POA: Insufficient documentation

## 2014-11-15 DIAGNOSIS — E291 Testicular hypofunction: Secondary | ICD-10-CM | POA: Insufficient documentation

## 2014-11-15 DIAGNOSIS — E119 Type 2 diabetes mellitus without complications: Secondary | ICD-10-CM | POA: Insufficient documentation

## 2014-11-15 DIAGNOSIS — I10 Essential (primary) hypertension: Secondary | ICD-10-CM | POA: Diagnosis not present

## 2014-11-15 DIAGNOSIS — R0600 Dyspnea, unspecified: Secondary | ICD-10-CM

## 2014-11-15 DIAGNOSIS — Y929 Unspecified place or not applicable: Secondary | ICD-10-CM | POA: Insufficient documentation

## 2014-11-15 DIAGNOSIS — Z888 Allergy status to other drugs, medicaments and biological substances status: Secondary | ICD-10-CM | POA: Diagnosis not present

## 2014-11-15 DIAGNOSIS — J309 Allergic rhinitis, unspecified: Secondary | ICD-10-CM | POA: Diagnosis not present

## 2014-11-15 DIAGNOSIS — E669 Obesity, unspecified: Secondary | ICD-10-CM | POA: Diagnosis not present

## 2014-11-15 DIAGNOSIS — J45909 Unspecified asthma, uncomplicated: Secondary | ICD-10-CM | POA: Insufficient documentation

## 2014-11-15 DIAGNOSIS — R079 Chest pain, unspecified: Secondary | ICD-10-CM | POA: Diagnosis present

## 2014-11-15 DIAGNOSIS — N209 Urinary calculus, unspecified: Secondary | ICD-10-CM | POA: Insufficient documentation

## 2014-11-15 DIAGNOSIS — Z79899 Other long term (current) drug therapy: Secondary | ICD-10-CM | POA: Diagnosis not present

## 2014-11-15 DIAGNOSIS — X58XXXA Exposure to other specified factors, initial encounter: Secondary | ICD-10-CM | POA: Diagnosis not present

## 2014-11-15 LAB — CBC WITH DIFFERENTIAL/PLATELET
BASOS ABS: 0 10*3/uL (ref 0.0–0.1)
BASOS PCT: 1 % (ref 0–1)
EOS PCT: 6 % — AB (ref 0–5)
Eosinophils Absolute: 0.3 10*3/uL (ref 0.0–0.7)
HCT: 46.7 % (ref 39.0–52.0)
Hemoglobin: 15.6 g/dL (ref 13.0–17.0)
LYMPHS ABS: 1.4 10*3/uL (ref 0.7–4.0)
Lymphocytes Relative: 27 % (ref 12–46)
MCH: 28.6 pg (ref 26.0–34.0)
MCHC: 33.4 g/dL (ref 30.0–36.0)
MCV: 85.7 fL (ref 78.0–100.0)
Monocytes Absolute: 0.4 10*3/uL (ref 0.1–1.0)
Monocytes Relative: 7 % (ref 3–12)
Neutro Abs: 3.2 10*3/uL (ref 1.7–7.7)
Neutrophils Relative %: 59 % (ref 43–77)
Platelets: 220 10*3/uL (ref 150–400)
RBC: 5.45 MIL/uL (ref 4.22–5.81)
RDW: 13.7 % (ref 11.5–15.5)
WBC: 5.3 10*3/uL (ref 4.0–10.5)

## 2014-11-15 LAB — BASIC METABOLIC PANEL
Anion gap: 11 (ref 5–15)
BUN: 18 mg/dL (ref 6–20)
CHLORIDE: 104 mmol/L (ref 101–111)
CO2: 25 mmol/L (ref 22–32)
CREATININE: 1.3 mg/dL — AB (ref 0.61–1.24)
Calcium: 9.9 mg/dL (ref 8.9–10.3)
GFR calc Af Amer: >60 mL/min (ref >60)
GFR calc non Af Amer: >60 mL/min (ref >60)
GLUCOSE: 132 mg/dL — AB (ref 65–99)
Potassium: 3.8 mmol/L (ref 3.5–5.1)
Sodium: 140 mmol/L (ref 135–145)

## 2014-11-15 LAB — I-STAT TROPONIN, ED: Troponin i, poc: 0 ng/mL (ref 0.00–0.08)

## 2014-11-15 LAB — BRAIN NATRIURETIC PEPTIDE: B NATRIURETIC PEPTIDE 5: 7.2 pg/mL (ref 0.0–100.0)

## 2014-11-15 MED ORDER — GLUCAGON HCL RDNA (DIAGNOSTIC) 1 MG IJ SOLR
1.0000 mg | Freq: Once | INTRAMUSCULAR | Status: AC
  Administered 2014-11-15: 1 mg via INTRAVENOUS
  Filled 2014-11-15: qty 1

## 2014-11-15 MED ORDER — ONDANSETRON HCL 4 MG/2ML IJ SOLN
4.0000 mg | Freq: Once | INTRAMUSCULAR | Status: DC
  Filled 2014-11-15: qty 2

## 2014-11-15 NOTE — ED Notes (Signed)
The pt is c/o chest paon sob and he has difficulty swallowing for 3-4 hours.  Similar symptoms after taking lisinopril in the past.

## 2014-11-15 NOTE — ED Provider Notes (Signed)
CSN: [id number]     Arrival date & time 11/15/14  2112 History   First MD Initiated Contact with Patient 11/15/14 2129     Chief Complaint  Patient presents with  . Chest Pain     (Consider location/radiation/quality/duration/timing/severity/associated sxs/prior Treatment) Patient is a 44 y.o. male presenting with chest pain. The history is provided by the patient. No language interpreter was used.  Chest Pain Pain location:  L chest Pain quality: aching   Pain radiates to:  Does not radiate Pain radiates to the back: no   Pain severity:  Moderate Onset quality:  Sudden Duration:  3 hours Timing:  Constant Progression:  Improving Chronicity:  New Context: at rest   Relieved by:  Nothing Worsened by:  Nothing tried Ineffective treatments:  None tried Associated symptoms: nausea, shortness of breath and vomiting   Associated symptoms: no abdominal pain, no back pain, no cough, no dizziness, no dysphagia, no fatigue, no fever, no headache, no numbness, no syncope and no weakness   Associated symptoms comment:  Choked on a piece of pork chop and feels like he cannot swallow Risk factors: obesity     Past Medical History  Diagnosis Date  . Hypertension   . Obesity   . Hypogonadism male   . Allergic rhinitis   . Diabetes mellitus   . Asthma   . Stones in the urinary tract   . Meniscus tear     rt knee   Past Surgical History  Procedure Laterality Date  . Knee arthroscopy  02/13/2012    Procedure: ARTHROSCOPY KNEE;  Surgeon: Alta Corning, MD;  Location: [facility];  Service: Orthopedics;  Laterality: Right;   Family History  Problem Relation Age of Onset  . Diabetes Mother    History  Substance Use Topics  . Smoking status: Never Smoker   . Smokeless tobacco: Never Used  . Alcohol Use: No    Review of Systems  Constitutional: Negative for fever, activity change, appetite change and fatigue.  HENT: Negative for congestion, facial swelling, rhinorrhea and trouble  swallowing.   Eyes: Negative for photophobia and pain.  Respiratory: Positive for shortness of breath. Negative for cough and chest tightness.   Cardiovascular: Positive for chest pain. Negative for leg swelling and syncope.  Gastrointestinal: Positive for nausea and vomiting. Negative for abdominal pain, diarrhea and constipation.  Endocrine: Negative for polydipsia and polyuria.  Genitourinary: Negative for dysuria, urgency, decreased urine volume and difficulty urinating.  Musculoskeletal: Negative for back pain and gait problem.  Skin: Negative for color change, rash and wound.  Allergic/Immunologic: Negative for immunocompromised state.  Neurological: Negative for dizziness, facial asymmetry, speech difficulty, weakness, numbness and headaches.  Psychiatric/Behavioral: Negative for confusion, decreased concentration and agitation.      Allergies  Lisinopril  Home Medications   Prior to Admission medications   Medication Sig Start Date End Date Taking? Authorizing Provider  amLODipine (NORVASC) 10 MG tablet Take 1 tablet (10 mg total) by mouth daily. 09/26/14  Yes Denita Lung, MD  carvedilol (COREG) 12.5 MG tablet Take 1 tablet (12.5 mg total) by mouth 2 (two) times daily with a meal. 09/25/14  Yes Denita Lung, MD  losartan-hydrochlorothiazide (HYZAAR) 100-12.5 MG per tablet TAKE 1 TABLET BY MOUTH EVERY DAY 09/25/14  Yes Denita Lung, MD  pioglitazone-metformin (ACTOPLUS MET) 15-500 MG per tablet Take 1 tablet by mouth 2 (two) times daily. 07/08/14  Yes Denita Lung, MD  rosuvastatin (CRESTOR) 40 MG tablet Take 1  tablet (40 mg total) by mouth daily. Patient taking differently: Take 20 mg by mouth at bedtime.  03/18/14  Yes Denita Lung, MD   BP 155/104 mmHg  Pulse 88  Temp(Src) 98.7 F (37.1 C) (Oral)  Resp 29  Ht 6' 6"  (1.981 m)  SpO2 95% Physical Exam  Constitutional: He is oriented to person, place, and time. He appears well-developed and well-nourished. No distress.   HENT:  Head: Normocephalic and atraumatic.  Mouth/Throat: No oropharyngeal exudate.  Eyes: Pupils are equal, round, and reactive to light.  Neck: Normal range of motion. Neck supple.  Cardiovascular: Normal rate, regular rhythm and normal heart sounds.  Exam reveals no gallop and no friction rub.   No murmur heard. Pulmonary/Chest: Effort normal and breath sounds normal. No respiratory distress. He has no wheezes. He has no rales.  Abdominal: Soft. Bowel sounds are normal. He exhibits no distension and no mass. There is no tenderness. There is no rebound and no guarding.  Musculoskeletal: Normal range of motion. He exhibits no edema or tenderness.  Neurological: He is alert and oriented to person, place, and time.  Skin: Skin is warm and dry.  Psychiatric: He has a normal mood and affect.    ED Course  Procedures (including critical care time) Labs Review Labs Reviewed  BASIC METABOLIC PANEL - Abnormal; Notable for the following:    Glucose, Bld 132 (*)    Creatinine, Ser 1.30 (*)    All other components within normal limits  CBC WITH DIFFERENTIAL/PLATELET - Abnormal; Notable for the following:    Eosinophils Relative 6 (*)    All other components within normal limits  BRAIN NATRIURETIC PEPTIDE  I-STAT TROPOININ, ED  Randolm Idol, ED    Imaging Review Dg Chest Port 1 View  11/15/2014   CLINICAL DATA:  Chest pain, dysphagia  EXAM: PORTABLE CHEST - 1 VIEW  COMPARISON:  02/07/2012  FINDINGS: Lungs are clear.  No pleural effusion or pneumothorax.  The heart is mildly enlarged.  IMPRESSION: No evidence of acute cardiopulmonary disease.   Electronically Signed   By: Julian Hy M.D.   On: 11/15/2014 22:33     EKG Interpretation   Date/Time:  Saturday November 15 2014 21:18:49 EDT Ventricular Rate:  82 PR Interval:  184 QRS Duration: 90 QT Interval:  364 QTC Calculation: 425 R Axis:   71 Text Interpretation:  Normal sinus rhythm T wave abnormality, consider  inferior  ischemia Abnormal ECG Changed from prior Confirmed by DOCHERTY   MD, MEGAN ([id number]) on 11/15/2014 9:30:43 PM      MDM   Final diagnoses:  Dyspnea  Chest pain  Food impaction of esophagus, initial encounter    Pt is a 44 y.o. male with Pmhx as above who presents with left-sided chest pain, shortness of breath and inability to tolerate secretions.  Dr. believes he got choked on a piece of pork chop about 5 PM.  He had had similar, though milder symptoms in the past that was always able to clear, food bolus.  Patient is frequently spitting secretions.  There is not drooling.  Cardiopulmonary exam is benign.  Abdominal exam is benign.  HEENT exam is benign.  EKG with new T-wave inversions in his inferior leads.  Suspect symptoms are due to his acute food bolus impaction.  However, we'll get a repeat troponin   1:10 AM].  Patient states he is feeling somewhat better, however, still unable to tolerate by mouth.  I spoke with Dr. Oletta Lamas of  GI, who is doing an emergent case at [facility] will plan on seeing patient, If still impacted.  Assuming repeat trop is negative, Pt will be asked to f/u with PCP for outpt stress testing.    [facility] Department is complete. It has been determined that no acute conditions requiring further emergency intervention are present at this time. The patient/guardian have been advised of the diagnosis and plan. We have discussed signs and symptoms that warrant return to the ED, such as changes or worsening in symptoms, worsening pain, SOB, fever, inability to tolerate liquids.       Ernestina Patches, MD 11/16/14 [phone]

## 2014-11-15 NOTE — ED Notes (Signed)
Dr. Tawnya Crook at the bedside.

## 2014-11-15 NOTE — ED Notes (Signed)
Portable x-ray at the bedside.  

## 2014-11-16 ENCOUNTER — Encounter (HOSPITAL_COMMUNITY)
Admission: EM | Disposition: A | Discharge status: Home / Self Care | Payer: Self-pay | Source: Home / Self Care | Attending: Emergency Medicine

## 2014-11-16 ENCOUNTER — Encounter (HOSPITAL_COMMUNITY): Payer: Self-pay | Admitting: *Deleted

## 2014-11-16 HISTORY — PX: ESOPHAGOGASTRODUODENOSCOPY: SHX5428

## 2014-11-16 LAB — I-STAT TROPONIN, ED: Troponin i, poc: 0 ng/mL (ref 0.00–0.08)

## 2014-11-16 LAB — HM COLONOSCOPY

## 2014-11-16 SURGERY — EGD (ESOPHAGOGASTRODUODENOSCOPY)
Anesthesia: Moderate Sedation

## 2014-11-16 MED ORDER — FENTANYL CITRATE (PF) 100 MCG/2ML IJ SOLN
INTRAMUSCULAR | Status: AC
Start: 2014-11-16 — End: 2014-11-16
  Filled 2014-11-16: qty 4

## 2014-11-16 MED ORDER — FENTANYL CITRATE (PF) 100 MCG/2ML IJ SOLN
INTRAMUSCULAR | Status: DC | PRN
  Administered 2014-11-16 (×5): 25 ug via INTRAVENOUS

## 2014-11-16 MED ORDER — OMEPRAZOLE 20 MG PO CPDR: 20.0000 mg | DELAYED_RELEASE_CAPSULE | Freq: Every day | ORAL | Status: DC

## 2014-11-16 MED ORDER — DIPHENHYDRAMINE HCL 50 MG/ML IJ SOLN
INTRAMUSCULAR | Status: DC | PRN
  Administered 2014-11-16 (×2): 25 mg via INTRAVENOUS

## 2014-11-16 MED ORDER — METOCLOPRAMIDE HCL 5 MG/ML IJ SOLN
10.0000 mg | Freq: Once | INTRAMUSCULAR | Status: AC
  Administered 2014-11-16: 10 mg via INTRAVENOUS
  Filled 2014-11-16: qty 2

## 2014-11-16 MED ORDER — BUTAMBEN-TETRACAINE-BENZOCAINE 2-2-14 % EX AERO
INHALATION_SPRAY | CUTANEOUS | Status: DC | PRN
  Administered 2014-11-16: 1 via TOPICAL

## 2014-11-16 MED ORDER — DIPHENHYDRAMINE HCL 50 MG/ML IJ SOLN
INTRAMUSCULAR | Status: AC
  Filled 2014-11-16: qty 1

## 2014-11-16 MED ORDER — MIDAZOLAM HCL 10 MG/2ML IJ SOLN
INTRAMUSCULAR | Status: DC | PRN
  Administered 2014-11-16: 2 mg via INTRAVENOUS
  Administered 2014-11-16: 1 mg via INTRAVENOUS
  Administered 2014-11-16 (×3): 2 mg via INTRAVENOUS

## 2014-11-16 MED ORDER — MIDAZOLAM HCL 5 MG/ML IJ SOLN
INTRAMUSCULAR | Status: AC
  Filled 2014-11-16: qty 2

## 2014-11-16 NOTE — Op Note (Signed)
[facility] [place], [id number]   ENDOSCOPY PROCEDURE REPORT  PATIENT: Griselda, Tosh  MR#: [id number] BIRTHDATE: [birth year] , 43  yrs. old GENDER: male ENDOSCOPIST:Yvetta Drotar Oletta Lamas, MD REFERRED BY: Jill Alexanders, M.D. , Emergency Room PROCEDURE DATE:  11/16/2014 PROCEDURE:   EGD w/ fb removal ASA CLASS:    Class III INDICATIONS: patient was eating steak approximately 10 hours ago and has been unable to swallow since that time. MEDICATION: Benadryl 50 mg IV, Fentanyl 125 mcg IV, and Versed 9 mg IV TOPICAL ANESTHETIC:   Cetacaine Spray  DESCRIPTION OF PROCEDURE:   After the risks and benefits of the procedure were explained, informed consent was obtained.  The endoscope [id number]  endoscope was introduced through the mouth  and advanced to the second portion of the duodenum .  The instrument was slowly withdrawn as the mucosa was fully examined. Estimated blood loss is zero unless otherwise noted in this procedure report.      ESOPHAGUS: There was a large amount of residual food seen at the gastroesophageal junction.  Due to the residual food, complete mucosal examination could not be performed.  STOMACH: Normal.  DUODENUM: Normal. Thisrepresented impacted state. Multiple passes of the scope had to be made.  the [employer] [facility] and [facility] were used. Multiple pieces of steak were present and had to be broken apart. The scope was passed a total of 8 to 10 times. Eventually all the pieces of steak were removed and a complete endoscopy was performed. There was no gross stricture in the esophagus and slight irritation and inflammation following the procedure. Retroflexed views revealed no abnormalities.    The scope was then withdrawn from the patient and the procedure completed.  COMPLICATIONS: There were no immediate complications.  ENDOSCOPIC IMPRESSION: 1.   There was a large amount of residual food seen at  the gastroesophageal junction. the food impaction was removed with multiple passages of the scope. The patient tolerated the procedure fairly well and there were no immediate complications 2.   Normal 3.   Normal RECOMMENDATIONS: Have discussed in detail with the patient's wife.  We will have the patient remain on ice chips and clear liquids today with soft food tonight.  He will begin Prilosec daily for 2 weeks.  His wife will call for any problems and I will see him in the office and follow-up in about one month.   _______________________________ eSigned:  Laurence Spates, MD 11/16/2014 3:45 AM     cc: Jill Alexanders, MD  CPT CODES: ICD CODES:  The ICD and CPT codes recommended by this software are interpretations from the data that the clinical staff has captured with the software.  The verification of the translation of this report to the ICD and CPT codes and modifiers is the sole responsibility of the health care institution and practicing physician where this report was generated.  [employer] will not be held responsible for the validity of the ICD and CPT codes included on this report.  AMA assumes no liability for data contained or not contained herein. CPT is a Designer, television/film set of the [employer].  PATIENT NAME:  Christon, Parada MR#: [id number]

## 2014-11-16 NOTE — Discharge Instructions (Signed)
Esophageal Spasm You can only have ice chips today. If you have no problems or pain by 3 PM he can progress to soda. For dinner only have a soft diet, no hard foods. Take Prilosec 20 mg before dinner for the next 2 weeks. Call Dr. Percell Miller for any worsening chest pain, shortness of breath, or fever. Call Dr. Oletta Lamas to schedule an appointment in the next week. Esophageal spasm is an uncoordinated contraction of the muscles of the esophagus (the tube which carries food from your mouth to your stomach). Normally, the muscles of the esophagus alternate between contraction and relaxation starting from the top of the esophagus and working down to the bottom. This moves the food from the mouth to the stomach. In esophageal spasm, all the muscles contract at once. This causes pain and fails to move the food along. As a result, you may have trouble swallowing.  Women are more likely than men to have esophageal spasm. The cause of the spasms is not known. Sometimes eating hot or cold foods triggers the condition and this may be due to an overly sensitive esophagus. This is not an infectious disease and cannot be passed to others. SYMPTOMS  Symptoms of esophageal spasm may include: chest pain, burning or pain with swallowing, and difficulty swallowing.  DIAGNOSIS  Esophageal spasm can be diagnosed by a test called manometry (pressure studies of the esophagus). In this test, a special tube is inserted down the esophagus. The tube measures the muscle activity of the esophagus. Abnormal contractions mixed with normal movement helps confirm the diagnosis.  A person with a hypersensitive esophagus may be diagnosed by inflating a long balloon in the person's esophagus. If this causes the same symptoms, preventive methods may work. PREVENTION  Avoid hot or cold foods if that seems to be a trigger. PROGNOSIS  This condition does not go away, nor is treatment entirely satisfactory. Patients need to be careful of what they  eat. They need to continue on medication if a useful one is found. Fortunately, the condition does not get progressively worse as time passes. Esophageal spasm does not usually lead to more serious problems but sometimes the pain can be disabling. If a person becomes afraid to eat they may become malnourished and lose weight.  TREATMENT   A procedure in which instruments of increasing size are inserted through the esophagus to enlarge (dilate) it are used.  Medications that decrease acid-production of the stomach may be used such as proton-pump inhibitors or H2-blockers.  Medications of several types can be used to relax the muscles of the esophagus.  An individual with a hypersensitive esophagus sometimes improves with low doses of medications normally used for depression.  No treatment for esophageal spasm is effective for everyone. Often several approaches will be tried before one works. In many cases, the symptoms will improve, but will not go away completely.  For severe cases, relief is obtained two-thirds of the time by cutting the muscles along the entire length of the esophagus. This is a major surgical procedure.  Your symptoms are usually the best guide to how well the treatment for esophageal spasm works. SIDE EFFECTS OF TREATMENTS  Nitrates can cause headaches and low blood pressure.  Calcium channel blockers can cause:  Feeling sick to your stomach (nausea).  Constipation and other side effects.  Antidepressants can cause side effects that depend on the medication used. HOME CARE INSTRUCTIONS   Let your caregiver know if problems are getting worse, or if you  get food stuck in your esophagus for longer than 1 hour or as directed and are unable to swallow liquid.  Take medications as directed and with permission of your caregiver. Ask about what to do if a medication seems to get stuck in your esophagus. Only take over-the-counter or prescription medicines for pain,  discomfort, or fever as directed by your caregiver.  Soft and liquid foods pass more easily than solid pieces. SEEK IMMEDIATE MEDICAL CARE IF:   You develop severe chest pain, especially if the pain is crushing or pressure-like and spreads to the arms, back, neck, or jaw, or if you have sweating, nausea, or shortness of breath. THIS COULD BE AN EMERGENCY. Do not wait to see if the pain will go away. Get medical help at once. Call 911 or 0 (operator). DO NOT drive yourself to the hospital.  Your chest pain gets worse and does not go away with rest.  You have an attack of chest pain lasting longer than usual despite rest and treatment with the medications your physician has prescribed.  You wake from sleep with chest pain or shortness of breath.  You feel dizzy or faint.  You have chest pain, not typical of your usual pain, caused by your esophagus for which you originally saw your caregiver. MAKE SURE YOU:   Understand these instructions.  Will watch your condition.  Will get help right away if you are not doing well or get worse. Document Released: 07/30/2002 Document Revised: 08/01/2011 Document Reviewed: 08/02/2013 [facility] Patient Information 2015 [place], [place]. This information is not intended to replace advice given to you by your health care provider. Make sure you discuss any questions you have with your health care provider.   Chest Pain (Nonspecific) It is often hard to give a specific diagnosis for the cause of chest pain. There is always a chance that your pain could be related to something serious, such as a heart attack or a blood clot in the lungs. You need to follow up with your health care provider for further evaluation. CAUSES   Heartburn.  Pneumonia or bronchitis.  Anxiety or stress.  Inflammation around your heart (pericarditis) or lung (pleuritis or pleurisy).  A blood clot in the lung.  A collapsed lung (pneumothorax). It can develop suddenly on its  own (spontaneous pneumothorax) or from trauma to the chest.  Shingles infection (herpes zoster virus). The chest wall is composed of bones, muscles, and cartilage. Any of these can be the source of the pain.  The bones can be bruised by injury.  The muscles or cartilage can be strained by coughing or overwork.  The cartilage can be affected by inflammation and become sore (costochondritis). DIAGNOSIS  Lab tests or other studies may be needed to find the cause of your pain. Your health care provider may have you take a test called an ambulatory electrocardiogram (ECG). An ECG records your heartbeat patterns over a 24-hour period. You may also have other tests, such as:  Transthoracic echocardiogram (TTE). During echocardiography, sound waves are used to evaluate how blood flows through your heart.  Transesophageal echocardiogram (TEE).  Cardiac monitoring. This allows your health care provider to monitor your heart rate and rhythm in real time.  Holter monitor. This is a portable device that records your heartbeat and can help diagnose heart arrhythmias. It allows your health care provider to track your heart activity for several days, if needed.  Stress tests by exercise or by giving medicine that makes the heart  beat faster. TREATMENT   Treatment depends on what may be causing your chest pain. Treatment may include:  Acid blockers for heartburn.  Anti-inflammatory medicine.  Pain medicine for inflammatory conditions.  Antibiotics if an infection is present.  You may be advised to change lifestyle habits. This includes stopping smoking and avoiding alcohol, caffeine, and chocolate.  You may be advised to keep your head raised (elevated) when sleeping. This reduces the chance of acid going backward from your stomach into your esophagus. Most of the time, nonspecific chest pain will improve within 2-3 days with rest and mild pain medicine.  HOME CARE INSTRUCTIONS   If  antibiotics were prescribed, take them as directed. Finish them even if you start to feel better.  For the next few days, avoid physical activities that bring on chest pain. Continue physical activities as directed.  Do not use any tobacco products, including cigarettes, chewing tobacco, or electronic cigarettes.  Avoid drinking alcohol.  Only take medicine as directed by your health care provider.  Follow your health care provider's suggestions for further testing if your chest pain does not go away.  Keep any follow-up appointments you made. If you do not go to an appointment, you could develop lasting (chronic) problems with pain. If there is any problem keeping an appointment, call to reschedule. SEEK MEDICAL CARE IF:   Your chest pain does not go away, even after treatment.  You have a rash with blisters on your chest.  You have a fever. SEEK IMMEDIATE MEDICAL CARE IF:   You have increased chest pain or pain that spreads to your arm, neck, jaw, back, or abdomen.  You have shortness of breath.  You have an increasing cough, or you cough up blood.  You have severe back or abdominal pain.  You feel nauseous or vomit.  You have severe weakness.  You faint.  You have chills. This is an emergency. Do not wait to see if the pain will go away. Get medical help at once. Call your local emergency services (911 in [place] Do not drive yourself to the hospital. MAKE SURE YOU:   Understand these instructions.  Will watch your condition.  Will get help right away if you are not doing well or get worse. Document Released: 02/16/2005 Document Revised: 05/14/2013 Document Reviewed: 12/13/2007 [facility] Patient Information 2015 [place], [place]. This information is not intended to replace advice given to you by your health care provider. Make sure you discuss any questions you have with your health care provider.

## 2014-11-16 NOTE — ED Notes (Signed)
GI team present at the bedside.

## 2014-11-16 NOTE — ED Notes (Signed)
Dr. Tawnya Crook at the bedside.

## 2014-11-16 NOTE — H&P (Signed)
  Subjective:   Patient is a 44 y.o. male presents with obstructed esophagus since 5 pm yesterday with meat impaction. Procedure including risks and benefits discussed in ER before the procedure with wife present. Risks of perf, bleeding etc discussed. No prior GERD.  Patient Active Problem List   Diagnosis Date Noted  . Cellulitis of leg, left 07/01/2014  . Diabetes mellitus 07/26/2011  . Hypertension associated with diabetes 07/26/2011  . Morbid obesity 07/26/2011  . Hyperlipidemia LDL goal <70 07/26/2011  . Hypogonadism male 07/26/2011  . Allergic rhinitis, seasonal 07/26/2011   Past Medical History  Diagnosis Date  . Hypertension   . Obesity   . Hypogonadism male   . Allergic rhinitis   . Diabetes mellitus   . Asthma   . Stones in the urinary tract   . Meniscus tear     rt knee    Past Surgical History  Procedure Laterality Date  . Knee arthroscopy  02/13/2012    Procedure: ARTHROSCOPY KNEE;  Surgeon: Alta Corning, MD;  Location: [facility];  Service: Orthopedics;  Laterality: Right;     (Not in a hospital admission) Allergies  Allergen Reactions  . Lisinopril Shortness Of Breath and Swelling    History  Substance Use Topics  . Smoking status: Never Smoker   . Smokeless tobacco: Never Used  . Alcohol Use: No    Family History  Problem Relation Age of Onset  . Diabetes Mother      Objective:   Patient Vitals for the past 8 hrs:  BP Temp Temp src Pulse Resp SpO2 Height  11/16/14 0225 - - - 80 14 95 % -  11/16/14 0220 - - - 81 21 94 % -  11/16/14 0145 (!) 161/102 mmHg - - 82 (!) 27 96 % -  11/16/14 0115 (!) 153/101 mmHg - - 86 22 94 % -  11/16/14 0100 167/89 mmHg - - 81 (!) 27 93 % -  11/16/14 0030 (!) 155/104 mmHg - - 88 (!) 29 95 % -  11/16/14 0015 162/100 mmHg - - 88 17 95 % -  11/16/14 0000 144/90 mmHg - - 83 16 98 % -  11/15/14 2339 148/91 mmHg - - 90 15 98 % -  11/15/14 2330 148/92 mmHg - - 78 14 99 % -  11/15/14 2315 154/99 mmHg - - 84 23 96 % -   11/15/14 2230 143/87 mmHg - - 88 24 94 % -  11/15/14 2215 148/78 mmHg - - 92 18 95 % -  11/15/14 2116 157/80 mmHg 98.7 F (37.1 C) Oral 81 18 95 % 6' 6"  (1.981 m)         See MD Preop evaluation      Assessment:   1. Food Impaction of Esophagus.  Plan:   EGD with removal of food impaction.

## 2014-11-17 ENCOUNTER — Encounter (HOSPITAL_COMMUNITY): Payer: Self-pay | Admitting: Gastroenterology

## 2014-11-25 ENCOUNTER — Encounter: Payer: Self-pay | Admitting: Internal Medicine

## 2014-12-08 ENCOUNTER — Ambulatory Visit: Payer: 59 | Admitting: Family Medicine

## 2014-12-11 ENCOUNTER — Ambulatory Visit (INDEPENDENT_AMBULATORY_CARE_PROVIDER_SITE_OTHER): Payer: 59 | Admitting: Family Medicine

## 2014-12-11 ENCOUNTER — Encounter: Payer: Self-pay | Admitting: Family Medicine

## 2014-12-11 VITALS — BP 122/78 | HR 86 | Ht 78.0 in | Wt >= 6400 oz

## 2014-12-11 DIAGNOSIS — E1159 Type 2 diabetes mellitus with other circulatory complications: Secondary | ICD-10-CM

## 2014-12-11 DIAGNOSIS — N528 Other male erectile dysfunction: Secondary | ICD-10-CM | POA: Diagnosis not present

## 2014-12-11 DIAGNOSIS — E785 Hyperlipidemia, unspecified: Secondary | ICD-10-CM

## 2014-12-11 DIAGNOSIS — E291 Testicular hypofunction: Secondary | ICD-10-CM | POA: Diagnosis not present

## 2014-12-11 DIAGNOSIS — N529 Male erectile dysfunction, unspecified: Secondary | ICD-10-CM | POA: Insufficient documentation

## 2014-12-11 DIAGNOSIS — I10 Essential (primary) hypertension: Secondary | ICD-10-CM | POA: Diagnosis not present

## 2014-12-11 DIAGNOSIS — J302 Other seasonal allergic rhinitis: Secondary | ICD-10-CM | POA: Diagnosis not present

## 2014-12-11 DIAGNOSIS — E118 Type 2 diabetes mellitus with unspecified complications: Secondary | ICD-10-CM

## 2014-12-11 DIAGNOSIS — E1169 Type 2 diabetes mellitus with other specified complication: Secondary | ICD-10-CM | POA: Diagnosis not present

## 2014-12-11 DIAGNOSIS — I152 Hypertension secondary to endocrine disorders: Secondary | ICD-10-CM

## 2014-12-11 LAB — HEMOGLOBIN A1C
Hgb A1c MFr Bld: 7.4 % — ABNORMAL HIGH
Mean Plasma Glucose: 166 mg/dL — ABNORMAL HIGH

## 2014-12-11 MED ORDER — PIOGLITAZONE HCL-METFORMIN HCL 15-500 MG PO TABS: 1.0000 | ORAL_TABLET | Freq: Two times a day (BID) | ORAL | Status: DC

## 2014-12-11 NOTE — Progress Notes (Signed)
  Subjective:    Patient ID: Guy Taylor, male    DOB: 09-01-1970, 44 y.o.   MRN: [id number]  Guy Taylor is a 44 y.o. male who presents for follow-up of Type 2 diabetes mellitus.  Home blood sugar records: Patient test BID Current symptoms/problems None Daily foot checks: yes   Any foot concerns:None Exercise: gym couple days a week. His exercise is limited due to cartilage damage as well as stress fracture to his left knee. He is being followed by cardiology. Eyes:09/12/14 His wife is now cooking and using recommendations from a program that she is now involved. She has lost 40 pounds he unfortunately has not. He does state that he eats the same amount of food that she does. He has all ready seen nutrition in the past to help with his diabetes. Also complains of continued difficulty with erectile dysfunction. He has tried Cialis but has not seen much success with that. He stopped taking his testosterone 2 days ago. The following portions of the patient's history were reviewed and updated as appropriate: allergies, current medications, past medical history, past social history and problem list.  ROS as in subjective above.     Objective:    Physical Exam Alert and in no distress otherwise not examined.  Lab Review Diabetic Labs Latest Ref Rng 11/15/2014 08/04/2014 03/11/2014 02/07/2013 11/12/2012  HbA1c - - 7.1 7.8% 6.8 6.3  Chol 0 - 200 mg/dL - - 193 - 201(H)  HDL >39 mg/dL - - 50 - 46  Calc LDL 0 - 99 mg/dL - - 121(H) - 124(H)  Triglycerides <150 mg/dL - - 112 - 156(H)  Creatinine 0.61 - 1.24 mg/dL 1.30(H) - 1.15 - 1.05   BP/Weight 11/16/2014 10/21/2014 10/08/2014 08/11/252 06/29/621  Systolic BP 762 831 517 616 073  Diastolic BP 90 90 710 626 90  Wt. (Lbs) - 430 435.5 437 435  BMI - 49.7 50.34 50.51 50.28   Foot/eye exam completion dates Latest Ref Rng 09/12/2014  Eye Exam No Retinopathy No Retinopathy  Foot Form Completion - -    Stacie  reports that he has never smoked. He  has never used smokeless tobacco. He reports that he does not drink alcohol or use illicit drugs.     Assessment & Plan:    Type 2 diabetes mellitus with complication - Plan: Hemoglobin A1c, pioglitazone-metformin (ACTOPLUS MET) 15-500 MG per tablet  Hypertension associated with diabetes  Morbid obesity  Hyperlipidemia associated with type 2 diabetes mellitus  Hypogonadism male  Allergic rhinitis, seasonal  Other male erectile dysfunction   1. Rx changes: Viagra given with instructions on proper use and possible side effects. 2. Education: Reviewed 'ABCs' of diabetes management (respective goals in parentheses):  A1C (<7), blood pressure (<130/80), and cholesterol (LDL <100). 3. Compliance at present is estimated to be fair. Efforts to improve compliance (if necessary) will be directed at dietary modifications: As per the present regimen and increased exercise. 4. Follow up: 4 months   5. I did discuss possible referral back to nutritionist or even to the surgeons concerning his weight. He plans to continue to work with his present diet and exercise regimen. He is also to let me know how he is doing on his Viagra. Return here in one month for recheck on his testosterone.

## 2015-01-15 ENCOUNTER — Encounter: Payer: Self-pay | Admitting: Family Medicine

## 2015-01-15 ENCOUNTER — Ambulatory Visit (INDEPENDENT_AMBULATORY_CARE_PROVIDER_SITE_OTHER): Payer: 59 | Admitting: Family Medicine

## 2015-01-15 VITALS — BP 130/90 | HR 80 | Wt >= 6400 oz

## 2015-01-15 DIAGNOSIS — Z7189 Other specified counseling: Secondary | ICD-10-CM

## 2015-01-15 DIAGNOSIS — Z719 Counseling, unspecified: Secondary | ICD-10-CM

## 2015-01-15 DIAGNOSIS — N528 Other male erectile dysfunction: Secondary | ICD-10-CM | POA: Diagnosis not present

## 2015-01-15 DIAGNOSIS — E291 Testicular hypofunction: Secondary | ICD-10-CM | POA: Diagnosis not present

## 2015-01-15 MED ORDER — SILDENAFIL CITRATE 100 MG PO TABS: 100.0000 mg | ORAL_TABLET | Freq: Every day | ORAL | Status: DC | PRN

## 2015-01-15 NOTE — Progress Notes (Signed)
   Subjective:    Patient ID: Guy Taylor, male    DOB: 12/22/1970, 44 y.o.   MRN: [id number]  HPI He is here for a blood draw. He is not taking testosterone regularly. He does state that Viagra works well for erectile dysfunction and would like a refill on this. He then started asking questions about his weight and the fact that his son died in 2009-08-03. He then became quite tearful. His wife thinks that he has not fully resolved this.   Review of Systems     Objective:   Physical Exam Alert and in no distress and tearful when discussing his son.       Assessment & Plan:  Hypogonadism male - Plan: Testosterone  Other male erectile dysfunction - Plan: sildenafil (VIAGRA) 100 MG tablet  Bereavement counseling I told him that it sounds as if he truly has not totally resolved the issue with the death of his son. Gave him permission to have all the feelings and emotions that he is having. Recommended counseling and did give him the phone number for hospice. I will refer him to a psychologist at his desire.

## 2015-01-15 NOTE — Patient Instructions (Signed)
Call hospice [phone]

## 2015-01-16 ENCOUNTER — Other Ambulatory Visit: Payer: Self-pay

## 2015-01-16 LAB — TESTOSTERONE: Testosterone: 202 ng/dL — ABNORMAL LOW (ref 300–890)

## 2015-01-16 MED ORDER — TESTOSTERONE 50 MG/5GM (1%) TD GEL: TRANSDERMAL | Status: DC

## 2015-01-29 ENCOUNTER — Encounter: Payer: Self-pay | Admitting: Family Medicine

## 2015-01-29 ENCOUNTER — Ambulatory Visit (INDEPENDENT_AMBULATORY_CARE_PROVIDER_SITE_OTHER): Payer: 59 | Admitting: Family Medicine

## 2015-01-29 VITALS — BP 130/90 | HR 84 | Resp 14 | Wt >= 6400 oz

## 2015-01-29 DIAGNOSIS — Z23 Encounter for immunization: Secondary | ICD-10-CM | POA: Diagnosis not present

## 2015-01-29 DIAGNOSIS — L309 Dermatitis, unspecified: Secondary | ICD-10-CM | POA: Diagnosis not present

## 2015-01-29 NOTE — Progress Notes (Signed)
   Subjective:    Patient ID: Guy Taylor, male    DOB: Jun 03, 1970, 44 y.o.   MRN: [id number]  HPI He is here for evaluation of a lesion on the dorsum of his right foot. He states that after he made the appointment, the lesions started to diminish.   Review of Systems     Objective:   Physical Exam Roundish slightly discolored lesion is noted on the dorsum of the right foot approximately 3 cm in size.       Assessment & Plan:  Dermatitis  Need for prophylactic vaccination and inoculation against influenza - Plan: Flu Vaccine QUAD 36+ mos IM Lesion is healing on its own so no further intervention necessary. Lu shot given.

## 2015-03-29 ENCOUNTER — Emergency Department (HOSPITAL_COMMUNITY)
Admission: EM | Admit: 2015-03-29 | Discharge: 2015-03-29 | Disposition: A | Discharge status: Home / Self Care | Payer: 59 | Attending: Emergency Medicine | Admitting: Emergency Medicine

## 2015-03-29 ENCOUNTER — Encounter (HOSPITAL_COMMUNITY): Payer: Self-pay | Admitting: Emergency Medicine

## 2015-03-29 DIAGNOSIS — E119 Type 2 diabetes mellitus without complications: Secondary | ICD-10-CM | POA: Diagnosis not present

## 2015-03-29 DIAGNOSIS — Z79818 Long term (current) use of other agents affecting estrogen receptors and estrogen levels: Secondary | ICD-10-CM | POA: Insufficient documentation

## 2015-03-29 DIAGNOSIS — I10 Essential (primary) hypertension: Secondary | ICD-10-CM | POA: Insufficient documentation

## 2015-03-29 DIAGNOSIS — E118 Type 2 diabetes mellitus with unspecified complications: Secondary | ICD-10-CM

## 2015-03-29 DIAGNOSIS — Z79899 Other long term (current) drug therapy: Secondary | ICD-10-CM | POA: Diagnosis not present

## 2015-03-29 DIAGNOSIS — R05 Cough: Secondary | ICD-10-CM | POA: Insufficient documentation

## 2015-03-29 DIAGNOSIS — N3091 Cystitis, unspecified with hematuria: Secondary | ICD-10-CM | POA: Diagnosis not present

## 2015-03-29 DIAGNOSIS — E669 Obesity, unspecified: Secondary | ICD-10-CM | POA: Insufficient documentation

## 2015-03-29 DIAGNOSIS — R319 Hematuria, unspecified: Secondary | ICD-10-CM | POA: Diagnosis present

## 2015-03-29 DIAGNOSIS — R0981 Nasal congestion: Secondary | ICD-10-CM | POA: Diagnosis not present

## 2015-03-29 DIAGNOSIS — E291 Testicular hypofunction: Secondary | ICD-10-CM | POA: Diagnosis not present

## 2015-03-29 DIAGNOSIS — J45909 Unspecified asthma, uncomplicated: Secondary | ICD-10-CM | POA: Diagnosis not present

## 2015-03-29 DIAGNOSIS — Z87442 Personal history of urinary calculi: Secondary | ICD-10-CM | POA: Diagnosis not present

## 2015-03-29 DIAGNOSIS — Z87828 Personal history of other (healed) physical injury and trauma: Secondary | ICD-10-CM | POA: Insufficient documentation

## 2015-03-29 DIAGNOSIS — N3090 Cystitis, unspecified without hematuria: Secondary | ICD-10-CM

## 2015-03-29 LAB — URINALYSIS, ROUTINE W REFLEX MICROSCOPIC
BILIRUBIN URINE: NEGATIVE
Glucose, UA: NEGATIVE mg/dL
Ketones, ur: NEGATIVE mg/dL
NITRITE: NEGATIVE
PH: 7 (ref 5.0–8.0)
Protein, ur: NEGATIVE mg/dL
SPECIFIC GRAVITY, URINE: 1.016 (ref 1.005–1.030)
Urobilinogen, UA: 0.2 mg/dL (ref 0.0–1.0)

## 2015-03-29 LAB — CBG MONITORING, ED: Glucose-Capillary: 145 mg/dL — ABNORMAL HIGH (ref 65–99)

## 2015-03-29 LAB — URINE MICROSCOPIC-ADD ON

## 2015-03-29 MED ORDER — CLOTRIMAZOLE-BETAMETHASONE 1-0.05 % EX CREA: 1.0000 "application " | TOPICAL_CREAM | Freq: Two times a day (BID) | CUTANEOUS | Status: DC

## 2015-03-29 MED ORDER — CEPHALEXIN 500 MG PO CAPS: 500.0000 mg | ORAL_CAPSULE | Freq: Four times a day (QID) | ORAL | Status: DC

## 2015-03-29 NOTE — ED Notes (Signed)
CBG 145

## 2015-03-29 NOTE — ED Notes (Signed)
Pt c/o burning with urination and blood in urine for a couple days.

## 2015-03-29 NOTE — ED Provider Notes (Signed)
Morbidly obese male with some urinary frequency and blood at end of stream today.  No systemic sx.  On exam has normal appaering penis, no blood or d/c from the meatus.  abd soft, rash to top of both feet  Treat for possible fungal rash to feet (has already been seen by MD - no improvement) - needs podiatry f/u. Treat for possible UTI.  Stable for d/c.  Medical screening examination/treatment/procedure(s) were conducted as a shared visit with non-physician practitioner(s) and myself.  I personally evaluated the patient during the encounter.  Clinical Impression:   Final diagnoses:  Cystitis  Type 2 diabetes mellitus with complication, without long-term current use of insulin (HCC)         Noemi Chapel, MD 03/29/15 2041

## 2015-03-29 NOTE — ED Provider Notes (Signed)
CSN: [id number]     Arrival date & time 03/29/15  0845 History   First MD Initiated Contact with Patient 03/29/15 0901     Chief Complaint  Patient presents with  . Hematuria     (Consider location/radiation/quality/duration/timing/severity/associated sxs/prior Treatment) Patient is a 44 y.o. male presenting with hematuria. The history is provided by the patient, the spouse and medical records. No language interpreter was used.  Hematuria Associated symptoms include congestion and coughing. Pertinent negatives include no abdominal pain, arthralgias, headaches, myalgias, nausea, neck pain, sore throat, vomiting or weakness.  Guy Taylor is a 44 y.o. male  with a PMH of DM, HTN, asthma presents to the Emergency Department complaining of hematuria. Patient states he had dysuria beginning on Friday 11/03, then Saturday he noticed blood when wiping after urinating. Patient also admits to increased urgency and frequency during this timeframe. Denies fever, chills, abdominal pain. No alleviating or aggravating factors noted. Currently 0/10 on pain scale.    Patient also complaining of draining sore on top of bilateral feet.    Past Medical History  Diagnosis Date  . Hypertension   . Obesity   . Hypogonadism male   . Allergic rhinitis   . Diabetes mellitus   . Asthma   . Stones in the urinary tract   . Meniscus tear     rt knee   Past Surgical History  Procedure Laterality Date  . Knee arthroscopy  02/13/2012    Procedure: ARTHROSCOPY KNEE;  Surgeon: Alta Corning, MD;  Location: [place];  Service: Orthopedics;  Laterality: Right;  . Esophagogastroduodenoscopy N/A 11/16/2014    Procedure: ESOPHAGOGASTRODUODENOSCOPY (EGD);  Surgeon: Laurence Spates, MD;  Location: [facility] ENDOSCOPY;  Service: Endoscopy;  Laterality: N/A;   Family History  Problem Relation Age of Onset  . Diabetes Mother    Social History  Substance Use Topics  . Smoking status: Never Smoker   . Smokeless tobacco: Never  Used  . Alcohol Use: No    Review of Systems  Constitutional: Negative.   HENT: Positive for congestion. Negative for rhinorrhea and sore throat.   Eyes: Negative for visual disturbance.  Respiratory: Positive for cough. Negative for shortness of breath and wheezing.   Cardiovascular: Negative.   Gastrointestinal: Negative for nausea, vomiting, abdominal pain, diarrhea and constipation.  Endocrine: Negative for polydipsia and polyuria.  Genitourinary: Positive for dysuria, urgency, frequency and hematuria. Negative for difficulty urinating.  Musculoskeletal: Negative for myalgias, back pain, arthralgias and neck pain.  Skin: Positive for wound (Bilateral dorsum of feet).  Neurological: Negative for dizziness, weakness and headaches.      Allergies  Lisinopril  Home Medications   Prior to Admission medications   Medication Sig Start Date End Date Taking? Authorizing Provider  amLODipine (NORVASC) 10 MG tablet Take 1 tablet (10 mg total) by mouth daily. 09/26/14  Yes Denita Lung, MD  carvedilol (COREG) 12.5 MG tablet Take 1 tablet (12.5 mg total) by mouth 2 (two) times daily with a meal. 09/25/14  Yes Denita Lung, MD  losartan-hydrochlorothiazide (HYZAAR) 100-12.5 MG per tablet TAKE 1 TABLET BY MOUTH EVERY DAY 09/25/14  Yes Denita Lung, MD  pioglitazone-metformin (ACTOPLUS MET) 15-500 MG per tablet Take 1 tablet by mouth 2 (two) times daily. 12/11/14  Yes Denita Lung, MD  rosuvastatin (CRESTOR) 40 MG tablet Take 1 tablet (40 mg total) by mouth daily. Patient taking differently: Take 20 mg by mouth at bedtime.  03/18/14  Yes Denita Lung, MD  testosterone (TESTIM) 50 MG/5GM (1%) GEL ;PATIENT IS TO PLACE 3 TUBES ON TO SKIN DAILY 01/16/15  Yes Denita Lung, MD  cephALEXin (KEFLEX) 500 MG capsule Take 1 capsule (500 mg total) by mouth 4 (four) times daily. 03/29/15   Ozella Almond Ward, PA-C  clotrimazole-betamethasone (LOTRISONE) cream Apply 1 application topically 2 (two) times  daily. 03/29/15   Jaime Pilcher Ward, PA-C   BP 139/82 mmHg  Pulse 88  Temp(Src) 98.3 F (36.8 C) (Oral)  Resp 18  Ht 6' 6"  (1.981 m)  Wt 440 lb (199.583 kg)  BMI 50.86 kg/m2  SpO2 95% Physical Exam  Constitutional: He is oriented to person, place, and time.  WDWN obese AAM who is alert and in NAD  HENT:  Head: Normocephalic and atraumatic.  Cardiovascular: Normal rate, regular rhythm, normal heart sounds and intact distal pulses.  Exam reveals no gallop and no friction rub.   No murmur heard. Pulmonary/Chest: Effort normal. No respiratory distress. He has wheezes. He has no rales. He exhibits no tenderness.  Faint, bilateral expiratory wheezing  Abdominal: He exhibits no mass. There is no rebound and no guarding.  Abdomen soft, non-tender, non-distended Bowel sounds positive in all four quadrants   Genitourinary: Circumcised.  Hypogonadism. No lesions, ulcers, source of bleeding identified. No penile discharge or bleeding noted.   Musculoskeletal: He exhibits edema.  Neurological: He is alert and oriented to person, place, and time.  Skin: Skin is warm and dry.     4x2 skin irritation on bilateral dorsum of feet.  Right appears to have fluid draining  Psychiatric: He has a normal mood and affect. His behavior is normal. Judgment and thought content normal.  Nursing note and vitals reviewed.   ED Course  Procedures (including critical care time) Labs Review Labs Reviewed  URINALYSIS, ROUTINE W REFLEX MICROSCOPIC (NOT AT [facility]) - Abnormal; Notable for the following:    Hgb urine dipstick SMALL (*)    Leukocytes, UA SMALL (*)    All other components within normal limits  URINE MICROSCOPIC-ADD ON - Abnormal; Notable for the following:    Squamous Epithelial / LPF FEW (*)    Casts WBC CAST (*)    All other components within normal limits  CBG MONITORING, ED - Abnormal; Notable for the following:    Glucose-Capillary 145 (*)    All other components within normal limits    URINE CULTURE    Imaging Review No results found. I have personally reviewed and evaluated these images and lab results as part of my medical decision-making.   EKG Interpretation None      MDM   Final diagnoses:  Cystitis  Type 2 diabetes mellitus with complication, without long-term current use of insulin (HCC)   Sharol Roussel presents for hematuria, noticed on tissue paper after wiping. I questioned thoroughly to be sure bleeding was not from rectum. Patient is certain it was penile, after urinating. Patient denies new sexual partners and denies chance of STI's. Due to patient's weight, symptoms, exam, & hx of diabetes, cystitis is likely. Prostatitis considered, but no pain, fever/chills makes this unlikely. STI's also on differential.   CBG: 145 UA reveals small amounts of leukocytes, neg. nitrites, and WBC casts. Will culture urine and treat clinically for cystitis.   A/P: Cystitis: will discharge home with Keflex and precautions for if/when to return and to follow up with urology if symptoms do not improve.  Likely fungal skin infection of bilateral dorsum of feet: discharge home with  ketoconazole cream and home care instructions  I explained the diagnosis and have given precautions as to when/if to return to the ER including for any other new or worsening symptoms. The patient understands and accepts the medical plan as it's been dictated and I have answered their questions. Discharge instructions concerning home care and prescriptions have been given. The patient is stable and is discharged to home in good condition.  Patient seen by and discussed with Dr. Sabra Heck who agrees with treatment plan.    [facility] Ward, PA-C 03/29/15 1049  Noemi Chapel, MD 03/29/15 2041

## 2015-03-29 NOTE — Discharge Instructions (Signed)
Stay very well hydrated with plenty of water throughout the day. Please take antibiotic until completion. Follow up with urology if symptoms do not improve.   Please seek immediate care if you develop the following: Your symptoms are no better or worse in 3 days. There is severe back pain or lower abdominal pain.  You develop chills.  You have a fever.  There is nausea or vomiting.  There is continued burning or discomfort with urination.

## 2015-03-30 ENCOUNTER — Telehealth: Payer: Self-pay

## 2015-03-30 NOTE — Telephone Encounter (Signed)
Called pt to see how he was doing.Patient said he was doing good and I informed him if he has any more problem to please not hesitate to call us patient said he sure would.

## 2015-03-31 LAB — URINE CULTURE

## 2015-04-01 ENCOUNTER — Telehealth (HOSPITAL_BASED_OUTPATIENT_CLINIC_OR_DEPARTMENT_OTHER): Payer: Self-pay | Admitting: Emergency Medicine

## 2015-04-01 NOTE — Telephone Encounter (Signed)
Post ED Visit - Positive Culture Follow-up  Culture report reviewed by antimicrobial stewardship pharmacist:  []  Elenor Quinones, Pharm.D. []  Heide Guile, Pharm.D., BCPS []  Parks Neptune, Pharm.D. []  Alycia Rossetti, Pharm.D., BCPS []  [place], Pharm.D., BCPS, AAHIVP []  Legrand Como, Pharm.D., BCPS, AAHIVP []  Milus Glazier, Pharm.D. [x]  Stephens November, Pharm.D.  Positive urine  Culture [place] Treated with cephalexin, organism sensitive to the same and no further patient follow-up is required at this time.  Hazle Nordmann 04/01/2015, 11:07 AM

## 2015-04-28 ENCOUNTER — Encounter: Payer: Self-pay | Admitting: Family Medicine

## 2015-04-28 ENCOUNTER — Ambulatory Visit (INDEPENDENT_AMBULATORY_CARE_PROVIDER_SITE_OTHER): Payer: 59 | Admitting: Family Medicine

## 2015-04-28 VITALS — BP 142/98 | HR 76 | Temp 98.9°F | Ht 78.0 in | Wt >= 6400 oz

## 2015-04-28 DIAGNOSIS — E1169 Type 2 diabetes mellitus with other specified complication: Secondary | ICD-10-CM

## 2015-04-28 DIAGNOSIS — E1159 Type 2 diabetes mellitus with other circulatory complications: Secondary | ICD-10-CM | POA: Diagnosis not present

## 2015-04-28 DIAGNOSIS — Z0000 Encounter for general adult medical examination without abnormal findings: Secondary | ICD-10-CM

## 2015-04-28 DIAGNOSIS — E785 Hyperlipidemia, unspecified: Secondary | ICD-10-CM | POA: Diagnosis not present

## 2015-04-28 DIAGNOSIS — I152 Hypertension secondary to endocrine disorders: Secondary | ICD-10-CM

## 2015-04-28 DIAGNOSIS — J302 Other seasonal allergic rhinitis: Secondary | ICD-10-CM

## 2015-04-28 DIAGNOSIS — Z9119 Patient's noncompliance with other medical treatment and regimen: Secondary | ICD-10-CM | POA: Diagnosis not present

## 2015-04-28 DIAGNOSIS — E291 Testicular hypofunction: Secondary | ICD-10-CM | POA: Diagnosis not present

## 2015-04-28 DIAGNOSIS — Z91199 Patient's noncompliance with other medical treatment and regimen due to unspecified reason: Secondary | ICD-10-CM

## 2015-04-28 DIAGNOSIS — I10 Essential (primary) hypertension: Secondary | ICD-10-CM | POA: Diagnosis not present

## 2015-04-28 DIAGNOSIS — E118 Type 2 diabetes mellitus with unspecified complications: Secondary | ICD-10-CM

## 2015-04-28 DIAGNOSIS — N528 Other male erectile dysfunction: Secondary | ICD-10-CM

## 2015-04-28 LAB — LIPID PANEL
CHOL/HDL RATIO: 3.5 ratio (ref <=5.0)
Cholesterol: 204 mg/dL — ABNORMAL HIGH (ref 125–200)
HDL: 59 mg/dL (ref >=40)
LDL CALC: 131 mg/dL — AB (ref <130)
Triglycerides: 70 mg/dL (ref <150)
VLDL: 14 mg/dL (ref <30)

## 2015-04-28 LAB — POCT GLYCOSYLATED HEMOGLOBIN (HGB A1C): Hemoglobin A1C: 7.3

## 2015-04-28 NOTE — Progress Notes (Signed)
   Subjective:    Patient ID: Guy Taylor, male    DOB: July 02, 1970, 44 y.o.   MRN: [id number]  HPI  he is here for a complete examination. He states that he is still quite fatigued. He presently is taking Testim four tubes per day and feels as if he has no energy. He states that this keeps him from exercising appropriately. He has been to diabetes education classes however readily admits that he is not following her recommendations in regard to eating.  He does have underlying EDHis physical activity is minimal as mentioned above. He does check his blood sugars  Twice per day. His medications were reviewed and are listed in the chart. His allergies are under good control. He is getting ready to switch jobs. His home life seems to be fairly stable. His daughter is now 84 years old. Family and social history as well as health maintenance and immunizations were reviewed. He has no chest pain, shortness breath, abdominal pain.  he does know the need to check his feet and eyes regularly.  Review of Systems  All other systems reviewed and are negative.      Objective:   Physical Exam Alert and in no distress. Tympanic membranes and canals are normal. Pharyngeal area is normal. Neck is supple without adenopathy or thyromegaly. Cardiac exam shows a regular sinus rhythm without murmurs or gallops. Lungs are clear to auscultation. Abdominal exam shows no masses or tenderness.        Assessment & Plan:  Hyperlipidemia associated with type 2 diabetes mellitus ([place]) - Plan: Lipid panel  Hypertension associated with diabetes ([place])  Hypogonadism male - Plan: Testosterone, PSA  Morbid obesity due to excess calories ([place]) - Plan: Amb Referral to Nutrition and Diabetic E  Other male erectile dysfunction  Allergic rhinitis, seasonal  Type 2 diabetes mellitus with complication, without long-term current use of insulin ([place]) - Plan: Amb Referral to Nutrition and Diabetic E, HgB A1c  Personal history  of noncompliance with medical treatment, presenting hazards to health  I had a long discussion with him concerning his noncompliance in regard to diet and exercise. We also discussed fatigue. I will consider placing him on testosterone shots if his testosterone level comes back low. He will continue on his other medications. Again encouraged him to more physically active. I will refer him back to diabetes education for fine tuning especially in regard to eating as he did finally admitted at the end of the encounter that he has not been very compliant.

## 2015-04-29 LAB — TESTOSTERONE: TESTOSTERONE: 219 ng/dL — AB (ref 300–890)

## 2015-04-29 LAB — PSA: PSA: 0.5 ng/mL (ref <=4.00)

## 2015-04-30 ENCOUNTER — Encounter: Payer: Self-pay | Admitting: Family Medicine

## 2015-04-30 ENCOUNTER — Ambulatory Visit (INDEPENDENT_AMBULATORY_CARE_PROVIDER_SITE_OTHER): Payer: 59 | Admitting: Family Medicine

## 2015-04-30 VITALS — BP 148/98 | HR 80 | Temp 98.8°F | Ht 78.0 in | Wt >= 6400 oz

## 2015-04-30 DIAGNOSIS — E291 Testicular hypofunction: Secondary | ICD-10-CM

## 2015-04-30 MED ORDER — TESTOSTERONE CYPIONATE 200 MG/ML IM SOLN: 200.0000 mg | INTRAMUSCULAR | Status: DC

## 2015-04-30 MED ORDER — SYRINGE (DISPOSABLE) 3 ML MISC: Status: DC

## 2015-04-30 MED ORDER — "NEEDLE (DISP) 22G X 1-1/2"" MISC": Status: DC

## 2015-04-30 NOTE — Addendum Note (Signed)
Addended byRoland Rack on: 04/30/2015 01:29 PM   Modules accepted: Orders, Medications

## 2015-04-30 NOTE — Progress Notes (Signed)
   Subjective:    Patient ID: Guy Taylor, male    DOB: July 08, 1970, 44 y.o.   MRN: [id number]  HPI  he is here for consult concerning low testosterone. He has has not had success with topical testosterone and I will therefore switch him to injectable.   Review of Systems     Objective:   Physical Exam  alert and in no distress otherwise not examined       Assessment & Plan:  Hypogonadism male  testosterone injectable was ordered with syringes and needles. He will pick this up and return tomorrow for education on injection. He is then to return in one-week to  [place] his peak level and at 2 weeks to see what his trough visit we will adjust accordingly.

## 2015-05-01 ENCOUNTER — Other Ambulatory Visit (INDEPENDENT_AMBULATORY_CARE_PROVIDER_SITE_OTHER): Payer: 59

## 2015-05-01 DIAGNOSIS — E291 Testicular hypofunction: Secondary | ICD-10-CM

## 2015-05-01 MED ORDER — TESTOSTERONE CYPIONATE 200 MG/ML IM SOLN
200.0000 mg | Freq: Once | INTRAMUSCULAR | Status: AC
  Administered 2015-05-01: 200 mg via INTRAMUSCULAR

## 2015-05-08 ENCOUNTER — Other Ambulatory Visit: Payer: 59

## 2015-05-08 ENCOUNTER — Other Ambulatory Visit: Payer: Self-pay | Admitting: Family Medicine

## 2015-05-08 DIAGNOSIS — E291 Testicular hypofunction: Secondary | ICD-10-CM

## 2015-05-09 LAB — TESTOSTERONE: TESTOSTERONE: 277 ng/dL — AB (ref 300–890)

## 2015-05-12 ENCOUNTER — Ambulatory Visit (INDEPENDENT_AMBULATORY_CARE_PROVIDER_SITE_OTHER): Payer: 59 | Admitting: Podiatry

## 2015-05-12 ENCOUNTER — Encounter: Payer: Self-pay | Admitting: Podiatry

## 2015-05-12 ENCOUNTER — Ambulatory Visit (INDEPENDENT_AMBULATORY_CARE_PROVIDER_SITE_OTHER): Payer: 59

## 2015-05-12 VITALS — BP 166/81 | HR 84 | Resp 16

## 2015-05-12 DIAGNOSIS — E119 Type 2 diabetes mellitus without complications: Secondary | ICD-10-CM | POA: Diagnosis not present

## 2015-05-12 DIAGNOSIS — L309 Dermatitis, unspecified: Secondary | ICD-10-CM

## 2015-05-12 MED ORDER — DOXYCYCLINE HYCLATE 100 MG PO TABS: 100.0000 mg | ORAL_TABLET | Freq: Two times a day (BID) | ORAL | Status: DC

## 2015-05-12 MED ORDER — MUPIROCIN 2 % EX OINT: TOPICAL_OINTMENT | CUTANEOUS | Status: DC

## 2015-05-12 MED ORDER — TRIAMCINOLONE ACETONIDE 0.1 % EX CREA: 1.0000 "application " | TOPICAL_CREAM | Freq: Two times a day (BID) | CUTANEOUS | Status: DC

## 2015-05-12 NOTE — Progress Notes (Signed)
   Subjective:    Patient ID: Guy Taylor, male    DOB: Dec 28, 1970, 44 y.o.   MRN: [id number]  HPI Comments:   Diabetic x 6 years and last A1C was 7.3  he presents today with his wife complaining of a rash the dorsal aspect of the right foot. He states that the rash is present to the dorsal aspect of both feet in the past but after a visit to the emergency department where he was prescribed  Antibiotics and a steroid cream resolved on left foot though remains weeping an painful with neuritis right foot. He states that he does not have this rash anywhere else on his body nor does he have any other rashes. He was seen by a dermatologist who suggested that it was only eczema impression right him a topical corticosteroid.    Review of Systems  HENT: Positive for sinus pressure.   Skin: Positive for color change, rash and wound.  All other systems reviewed and are negative.      Objective:   Physical Exam : Guy Taylor presents as a 44 year old black male vital signs stable alert and oriented 3 no apparent distress. Pulses are strongly palpable neurologic sensorium is intact deep tendon reflexes are intact muscle strength is intact orthopedic evaluation industries mild pes planus bilateral no obvious orthopedic issues. Cutaneous evaluation and straight supple well-hydrated cutis no erythema edema saline as drainage or odor with exception of the dorsal aspect of the right foot which does demonstrate an area a dry xerotic tissue overlying an erythematous base with some surrounding cellulitis. The lines within the rash appear to have cracked and opened and do demonstrate some draining.        Assessment & Plan:   mild cellulitis dorsal aspect right foot secondary to what appears to be psoriasis or eczema right foot.   Plan we will start him on doxycycline triamcinolone 1% twice daily and Bactroban ointment. I will follow-up with him in 3 weeks at which time we may take a  Punch biopsy.

## 2015-05-22 ENCOUNTER — Other Ambulatory Visit: Payer: 59

## 2015-05-26 ENCOUNTER — Other Ambulatory Visit: Payer: 59

## 2015-05-26 DIAGNOSIS — E291 Testicular hypofunction: Secondary | ICD-10-CM

## 2015-05-27 ENCOUNTER — Other Ambulatory Visit: Payer: Self-pay | Admitting: Family Medicine

## 2015-05-27 DIAGNOSIS — E291 Testicular hypofunction: Secondary | ICD-10-CM

## 2015-05-27 LAB — TESTOSTERONE: Testosterone: 185 ng/dL — ABNORMAL LOW (ref 300–890)

## 2015-05-27 NOTE — Progress Notes (Unsigned)
   Subjective:    Patient ID: Guy Taylor, male    DOB: [birth year], 45 y.o.   MRN: [id number]  HPI    Review of Systems     Objective:   Physical Exam        Assessment & Plan:  He gave himself 1.5 mL of testosterone 2 weeks prior to yesterday's blood draw. That number was low. I will have him do on 0.5 mL again but check his number at one week.

## 2015-06-04 ENCOUNTER — Ambulatory Visit: Payer: 59 | Admitting: Podiatry

## 2015-06-05 ENCOUNTER — Other Ambulatory Visit: Payer: 59

## 2015-06-05 ENCOUNTER — Telehealth: Payer: Self-pay | Admitting: Internal Medicine

## 2015-06-05 DIAGNOSIS — E291 Testicular hypofunction: Secondary | ICD-10-CM

## 2015-06-05 NOTE — Telephone Encounter (Signed)
Pt came by today and got his testosterone level drawn, however he is wanting to start coming in for his testosterone injections as he does not like giving them to himself. Is this okay for him to start coming in for nurse visit for injection. Please advise when we get lab results back

## 2015-06-05 NOTE — Telephone Encounter (Signed)
Set this up

## 2015-06-06 LAB — TESTOSTERONE: TESTOSTERONE: 379 ng/dL (ref 250–827)

## 2015-06-08 ENCOUNTER — Telehealth: Payer: Self-pay | Admitting: Internal Medicine

## 2015-06-08 NOTE — Telephone Encounter (Signed)
Per [employer]. Pt is coming in this Friday for testosterone injection 2cc and then is to come back next Friday for testosterone lab draw.

## 2015-06-08 NOTE — Telephone Encounter (Signed)
Pt was notified that it was ok to start doing injections here. Pt will call us when he needs to come in to schedule something

## 2015-06-08 NOTE — Progress Notes (Signed)
   Subjective:    Patient ID: Guy Taylor, male    DOB: 04-Nov-1970, 45 y.o.   MRN: [id number]  HPI    Review of Systems     Objective:   Physical Exam        Assessment & Plan:  His most recent testosterone level was taken one week after the injection. He was given 1.5cc. He will come in next Friday for an injection of 2cc and then have the blood work drawn in one week.

## 2015-06-09 ENCOUNTER — Encounter (INDEPENDENT_AMBULATORY_CARE_PROVIDER_SITE_OTHER): Payer: 59 | Admitting: Podiatry

## 2015-06-09 DIAGNOSIS — L309 Dermatitis, unspecified: Secondary | ICD-10-CM

## 2015-06-09 NOTE — Progress Notes (Signed)
This encounter was created in error - please disregard.

## 2015-06-12 ENCOUNTER — Encounter: Payer: Self-pay | Admitting: *Deleted

## 2015-06-12 ENCOUNTER — Telehealth: Payer: Self-pay

## 2015-06-12 ENCOUNTER — Other Ambulatory Visit (INDEPENDENT_AMBULATORY_CARE_PROVIDER_SITE_OTHER): Payer: 59

## 2015-06-12 ENCOUNTER — Encounter: Payer: 59 | Attending: Family Medicine | Admitting: *Deleted

## 2015-06-12 VITALS — Ht 78.0 in | Wt >= 6400 oz

## 2015-06-12 DIAGNOSIS — Z713 Dietary counseling and surveillance: Secondary | ICD-10-CM | POA: Insufficient documentation

## 2015-06-12 DIAGNOSIS — E119 Type 2 diabetes mellitus without complications: Secondary | ICD-10-CM

## 2015-06-12 DIAGNOSIS — E291 Testicular hypofunction: Secondary | ICD-10-CM

## 2015-06-12 MED ORDER — TESTOSTERONE CYPIONATE 200 MG/ML IM SOLN: 200.0000 mg | INTRAMUSCULAR | Status: DC

## 2015-06-12 MED ORDER — TESTOSTERONE CYPIONATE 200 MG/ML IM SOLN: 400.0000 mg | INTRAMUSCULAR | Status: DC

## 2015-06-12 MED ORDER — TESTOSTERONE CYPIONATE 200 MG/ML IM SOLN
400.0000 mg | INTRAMUSCULAR | Status: DC
  Administered 2015-06-12: 400 mg via INTRAMUSCULAR

## 2015-06-12 NOTE — Telephone Encounter (Signed)
53ms will get him through the month., called in med to pharmacy

## 2015-06-12 NOTE — Progress Notes (Signed)
Diabetes Self-Management Education  Visit Type: First/Initial  Appt. Start Time: 0900 Appt. End Time: 1030  06/12/2015  Mr. Guy Taylor, identified by name and date of birth, is a 45 y.o. male with a diagnosis of Diabetes: Type 2.   ASSESSMENT  Height 6' 6"  (1.981 m), weight 441 lb 6.4 oz (200.218 kg). Body mass index is 51.02 kg/(m^2).      Diabetes Self-Management Education - 06/12/15 0905    Visit Information   Visit Type First/Initial   Initial Visit   Diabetes Type Type 2   Are you currently following a meal plan? No   Date Diagnosed 2008   Health Coping   How would you rate your overall health? Fair   Psychosocial Assessment   Patient Belief/Attitude about Diabetes Motivated to manage diabetes   Self-care barriers None   Self-management support Family;Doctor's office   Other persons present Patient   Patient Concerns Nutrition/Meal planning;Weight Control;Glycemic Control   Special Needs None   Preferred Learning Style Hands on;Visual   How often do you need to have someone help you when you read instructions, pamphlets, or other written materials from your doctor or pharmacy? 1 - Never   What is the last grade level you completed in school? college   Complications   Last HgB A1C per patient/outside source 7.2 %   How often do you check your blood sugar? 1-2 times/day   Fasting Blood glucose range (mg/dL) 70-129;130-179   Postprandial Blood glucose range (mg/dL) 130-179   Number of hypoglycemic episodes per month 0   Have you had a dilated eye exam in the past 12 months? Yes   Have you had a dental exam in the past 12 months? Yes   Are you checking your feet? Yes   How many days per week are you checking your feet? 5   Dietary Intake   Breakfast omelet, ocasionally with bacon, ocasionally 2 toast   Lunch 5 PM before goes to work,  6 - 8 meat, starch, vegetables, (1 plate if working, a 2nd plate if not working)   Media planner (afternoon) occasionally chips, apples  frequently   Dinner 3-4 AM brings from home: left overs of mixed foods   Snack (evening) apples   Beverage(s) water, occasionally juice, can of regular soda @ work   Exercise   Exercise Type Light (walking / raking leaves)  most walking is done at work in [employer] many days per week to you exercise? 3.5   How many minutes per day do you exercise? 30   Total minutes per week of exercise 105   Patient Education   Previous Diabetes Education Yes (please comment)   Disease state  Definition of diabetes, type 1 and 2, and the diagnosis of diabetes   Nutrition management  Role of diet in the treatment of diabetes and the relationship between the three main macronutrients and blood glucose level;Food label reading, portion sizes and measuring food.;Carbohydrate counting   Physical activity and exercise  Role of exercise on diabetes management, blood pressure control and cardiac health.   Medications Reviewed patients medication for diabetes, action, purpose, timing of dose and side effects.   Monitoring Identified appropriate SMBG and/or A1C goals.   Chronic complications Relationship between chronic complications and blood glucose control   Psychosocial adjustment Identified and addressed patients feelings and concerns about diabetes;Worked with patient to identify barriers to care and solutions   Individualized Goals (developed by patient)   Nutrition Follow meal  plan discussed   Physical Activity Exercise 3-5 times per week   Medications take my medication as prescribed   Monitoring  test blood glucose pre and post meals as discussed   Reducing Risk examine blood glucose patterns   Health Coping Other (comment)  consider therapy to help deal with some issues we discussed here today   Outcomes   Expected Outcomes Demonstrated interest in learning. Expect positive outcomes   Future DMSE 4-6 wks   Program Status Not Completed      Individualized Plan for Diabetes Self-Management  Training:   Learning Objective:  Patient will have a greater understanding of diabetes self-management. Patient education plan is to attend individual and/or group sessions per assessed needs and concerns.   Plan:   Patient Instructions  Plan:  Aim for 5 Carb Choices per meal (75 grams) +/- 1 either way  Aim for 0-2 Carbs per snack if hungry  Include protein in moderation with your meals and snacks Consider reading food labels for Total Carbohydrate of foods Consider  increasing your activity level for 30 minutes daily and every 2 weeks increase as tolerated Continuer checking BG at alternate times per day as directed by MD  Continue taking medication as directed by MD      Expected Outcomes:  Demonstrated interest in learning. Expect positive outcomes  Education material provided: Living Well with Diabetes, A1C conversion sheet, Meal plan card and Carbohydrate counting sheet  If problems or questions, patient to contact team via:  Phone and Email  Future DSME appointment: 4-6 wks

## 2015-06-12 NOTE — Telephone Encounter (Signed)
Pt called to let you know that he cannot refill his Testosterone because it says it is too early to refill it. Pt states you increased his dosing to 1 1/2 instead of just one vial, but doesn't have enough to do his 1 1/2 today. He needs more. He uses the [facility] on [address].

## 2015-06-12 NOTE — Telephone Encounter (Signed)
Take care of this

## 2015-06-12 NOTE — Patient Instructions (Signed)
Plan:  Aim for 5 Carb Choices per meal (75 grams) +/- 1 either way  Aim for 0-2 Carbs per snack if hungry  Include protein in moderation with your meals and snacks Consider reading food labels for Total Carbohydrate of foods Consider  increasing your activity level for 30 minutes daily and every 2 weeks increase as tolerated Continuer checking BG at alternate times per day as directed by MD  Continue taking medication as directed by MD

## 2015-06-16 ENCOUNTER — Ambulatory Visit (INDEPENDENT_AMBULATORY_CARE_PROVIDER_SITE_OTHER): Payer: 59 | Admitting: Podiatry

## 2015-06-16 ENCOUNTER — Encounter: Payer: Self-pay | Admitting: Podiatry

## 2015-06-16 VITALS — BP 173/107 | HR 92 | Resp 16

## 2015-06-16 DIAGNOSIS — L309 Dermatitis, unspecified: Secondary | ICD-10-CM

## 2015-06-16 NOTE — Progress Notes (Signed)
He presents today for follow-up of his rashes or dorsal aspect of his right foot. He states that the antifungal and antibacterial creams seem to be working. He states that it is much better than it was.  Objective: Vital signs are stable he is alert and oriented 3. The area of the rashes the dorsal aspect of the right foot appears to be resolving from dorsal medial to dorsal lateral and is resolved by proximally 66%.  Assessment: Resolving dermatitis dorsal aspect right foot.  Plan: Continue current medications follow up with me as needed.

## 2015-06-21 ENCOUNTER — Other Ambulatory Visit: Payer: Self-pay | Admitting: Family Medicine

## 2015-06-22 ENCOUNTER — Other Ambulatory Visit: Payer: Self-pay | Admitting: *Deleted

## 2015-06-22 MED ORDER — PIOGLITAZONE HCL-METFORMIN HCL 15-500 MG PO TABS: 1.0000 | ORAL_TABLET | Freq: Two times a day (BID) | ORAL | Status: DC

## 2015-08-12 ENCOUNTER — Ambulatory Visit (INDEPENDENT_AMBULATORY_CARE_PROVIDER_SITE_OTHER): Payer: 59 | Admitting: Family Medicine

## 2015-08-12 ENCOUNTER — Encounter: Payer: Self-pay | Admitting: Family Medicine

## 2015-08-12 VITALS — BP 112/82 | HR 100 | Resp 16 | Ht 78.0 in | Wt >= 6400 oz

## 2015-08-12 DIAGNOSIS — I152 Hypertension secondary to endocrine disorders: Secondary | ICD-10-CM

## 2015-08-12 DIAGNOSIS — E1169 Type 2 diabetes mellitus with other specified complication: Secondary | ICD-10-CM | POA: Diagnosis not present

## 2015-08-12 DIAGNOSIS — E1159 Type 2 diabetes mellitus with other circulatory complications: Secondary | ICD-10-CM | POA: Diagnosis not present

## 2015-08-12 DIAGNOSIS — E785 Hyperlipidemia, unspecified: Secondary | ICD-10-CM

## 2015-08-12 DIAGNOSIS — L03116 Cellulitis of left lower limb: Secondary | ICD-10-CM

## 2015-08-12 DIAGNOSIS — I10 Essential (primary) hypertension: Secondary | ICD-10-CM

## 2015-08-12 DIAGNOSIS — E118 Type 2 diabetes mellitus with unspecified complications: Secondary | ICD-10-CM

## 2015-08-12 DIAGNOSIS — E291 Testicular hypofunction: Secondary | ICD-10-CM | POA: Diagnosis not present

## 2015-08-12 DIAGNOSIS — N528 Other male erectile dysfunction: Secondary | ICD-10-CM | POA: Diagnosis not present

## 2015-08-12 MED ORDER — CIPROFLOXACIN HCL 500 MG PO TABS: 500.0000 mg | ORAL_TABLET | Freq: Two times a day (BID) | ORAL | Status: DC

## 2015-08-12 MED ORDER — CLINDAMYCIN HCL 300 MG PO CAPS: 300.0000 mg | ORAL_CAPSULE | Freq: Three times a day (TID) | ORAL | Status: DC

## 2015-08-12 NOTE — Progress Notes (Signed)
Subjective:    Patient ID: Guy Taylor, male    DOB: [birth year], 45 y.o.   MRN: [id number]  Guy Taylor is a 45 y.o. male who presents for follow-up of Type 2 diabetes mellitus.  Patient is checking home blood sugars.   Home blood sugar records: BGs range between 120 and 159 How often is blood sugars being checked: 2 Current symptoms/problems include none and have been stable. Daily foot checks: yes   Any foot concerns: no Last eye exam: [id number] Exercise: The patient does not participate in regular exercise at present. He has made major dietary changes with the help of his wife and feels very good about this.he also has noted increased difficulty with left calf discomfort. He was treated for this in the past as cellulitis and apparently did respond well to clindamycin and Cipro. He was seen by me and by Dr. Gardenia Phlegm for this.he does have underlying hypogonadism but no longer postural. He has had no further difficulty with EGD. The following portions of the patient's history were reviewed and updated as appropriate: allergies, current medications, past medical history, past social history and problem list.  ROS as in subjective above.     Objective:    Physical Exam Alert and in no distress slight erythema with an open small wound present on the left lateral calf. It is tender to palpation.  Lab Review Diabetic Labs Latest Ref Rng 04/28/2015 12/11/2014 11/15/2014 08/04/2014 03/11/2014  HbA1c - 7.3 7.4(H) - 7.1 7.8%  Chol 125 - 200 mg/dL 204(H) - - - 193  HDL >=40 mg/dL 59 - - - 50  Calc LDL <130 mg/dL 131(H) - - - 121(H)  Triglycerides <150 mg/dL 70 - - - 112  Creatinine 0.61 - 1.24 mg/dL - - 1.30(H) - 1.15   BP/Weight 06/16/2015 06/12/2015 05/12/2015 04/30/2015 67/10/1948  Systolic BP 932 - 671 245 809  Diastolic BP 983 - 81 98 98  Wt. (Lbs) - 441.4 - 443 448  BMI - 51.02 - 51.2 51.78   Foot/eye exam completion dates Latest Ref Rng 09/12/2014  Eye Exam No Retinopathy No  Retinopathy  Foot Form Completion - -  A1c is 8.3  Guy Taylor  reports that he has never smoked. He has never used smokeless tobacco. He reports that he does not drink alcohol or use illicit drugs.     Assessment & Plan:    Type 2 diabetes mellitus with complication, without long-term current use of insulin (HCC)  Other male erectile dysfunction  Morbid obesity due to excess calories ([place])  Hypogonadism male  Hypertension associated with diabetes ([place])  Hyperlipidemia associated with type 2 diabetes mellitus ([place])  Cellulitis of left lower extremity - Plan: clindamycin (CLEOCIN) 300 MG capsule, ciprofloxacin (CIPRO) 500 MG tablet   1. Rx changes: he was started on Cipro and clindamycin. 2. Education: Reviewed 'ABCs' of diabetes management (respective goals in parentheses):  A1C (<7), blood pressure (<130/80), and cholesterol (LDL <100). 3. Compliance at present is estimated to be good. Efforts to improve compliance (if necessary) will be directed at continue with present diet and exercise regimen. 4. Follow up: 4 months  5. He is to return here in 1 month for follow-up concerning the cellulitis as this is a recurrent issue. I encouraged him to continue with his diet and weight loss. Will hold off on giving him any more diabetes mellitus and since he has lost over 20 pounds. Hopefully we will see an improvement in his blood sugars. Encouraged him  to call me if he sees that they're creeping above 180.

## 2015-08-12 NOTE — Patient Instructions (Signed)
Check your sugars either before a meal or 2 hours after meal and if you see your numbers going up in spite of your weight loss let me know

## 2015-08-13 ENCOUNTER — Telehealth: Payer: Self-pay | Admitting: Internal Medicine

## 2015-08-13 NOTE — Telephone Encounter (Signed)
Dr. Redmond School ask to send in test strips and needles. i called and left detailed message on pt vm asking the follow-up questions and asked that if i am not here to give info to someone else so we can get is stuff refilled for him  1- what meter does he use? 2- how many times a day does he use it? 3- what pharmacy does he want it sent to? [facility] that's listed?

## 2015-08-14 MED ORDER — GLUCOSE BLOOD VI STRP
ORAL_STRIP | Status: DC
Start: 2015-08-14 — End: 2023-03-21

## 2015-08-14 MED ORDER — LANCETS 30G MISC: Status: DC

## 2015-08-14 NOTE — Telephone Encounter (Signed)
Pt uses one touch ultra meter and uses one touch ultra test strips and test twice a day. Sent 6 months worth of testing supplies into the pharmacy for pt

## 2015-08-14 NOTE — Telephone Encounter (Signed)
Left message for pt to call me back 

## 2015-09-16 ENCOUNTER — Ambulatory Visit: Payer: 59 | Admitting: Family Medicine

## 2015-09-25 ENCOUNTER — Encounter (HOSPITAL_COMMUNITY): Payer: Self-pay | Admitting: *Deleted

## 2015-09-25 ENCOUNTER — Emergency Department (HOSPITAL_COMMUNITY): Payer: 59

## 2015-09-25 ENCOUNTER — Emergency Department (HOSPITAL_COMMUNITY)
Admission: EM | Admit: 2015-09-25 | Discharge: 2015-09-25 | Disposition: A | Discharge status: Home / Self Care | Payer: 59 | Attending: Emergency Medicine | Admitting: Emergency Medicine

## 2015-09-25 DIAGNOSIS — E669 Obesity, unspecified: Secondary | ICD-10-CM | POA: Diagnosis not present

## 2015-09-25 DIAGNOSIS — Z79899 Other long term (current) drug therapy: Secondary | ICD-10-CM | POA: Insufficient documentation

## 2015-09-25 DIAGNOSIS — N200 Calculus of kidney: Secondary | ICD-10-CM | POA: Diagnosis not present

## 2015-09-25 DIAGNOSIS — I10 Essential (primary) hypertension: Secondary | ICD-10-CM | POA: Diagnosis not present

## 2015-09-25 DIAGNOSIS — Z87828 Personal history of other (healed) physical injury and trauma: Secondary | ICD-10-CM | POA: Diagnosis not present

## 2015-09-25 DIAGNOSIS — E119 Type 2 diabetes mellitus without complications: Secondary | ICD-10-CM | POA: Insufficient documentation

## 2015-09-25 DIAGNOSIS — J45909 Unspecified asthma, uncomplicated: Secondary | ICD-10-CM | POA: Diagnosis not present

## 2015-09-25 DIAGNOSIS — Z792 Long term (current) use of antibiotics: Secondary | ICD-10-CM | POA: Diagnosis not present

## 2015-09-25 DIAGNOSIS — R109 Unspecified abdominal pain: Secondary | ICD-10-CM | POA: Diagnosis present

## 2015-09-25 DIAGNOSIS — Z7984 Long term (current) use of oral hypoglycemic drugs: Secondary | ICD-10-CM | POA: Diagnosis not present

## 2015-09-25 HISTORY — DX: Disorder of kidney and ureter, unspecified: N28.9

## 2015-09-25 LAB — COMPREHENSIVE METABOLIC PANEL
ALK PHOS: 53 U/L (ref 38–126)
ALT: 16 U/L — AB (ref 17–63)
ANION GAP: 9 (ref 5–15)
AST: 23 U/L (ref 15–41)
Albumin: 4.3 g/dL (ref 3.5–5.0)
BILIRUBIN TOTAL: 0.7 mg/dL (ref 0.3–1.2)
BUN: 16 mg/dL (ref 6–20)
CALCIUM: 9.6 mg/dL (ref 8.9–10.3)
CO2: 26 mmol/L (ref 22–32)
CREATININE: 1.56 mg/dL — AB (ref 0.61–1.24)
Chloride: 106 mmol/L (ref 101–111)
GFR, EST NON AFRICAN AMERICAN: 52 mL/min — AB (ref >60)
Glucose, Bld: 140 mg/dL — ABNORMAL HIGH (ref 65–99)
Potassium: 4.8 mmol/L (ref 3.5–5.1)
Sodium: 141 mmol/L (ref 135–145)
Total Protein: 7.4 g/dL (ref 6.5–8.1)

## 2015-09-25 LAB — URINALYSIS, ROUTINE W REFLEX MICROSCOPIC
Bilirubin Urine: NEGATIVE
Glucose, UA: NEGATIVE mg/dL
HGB URINE DIPSTICK: NEGATIVE
Ketones, ur: NEGATIVE mg/dL
LEUKOCYTES UA: NEGATIVE
NITRITE: NEGATIVE
PROTEIN: NEGATIVE mg/dL
Specific Gravity, Urine: 1.017 (ref 1.005–1.030)
pH: 8 (ref 5.0–8.0)

## 2015-09-25 LAB — CBC
HCT: 46 % (ref 39.0–52.0)
HEMOGLOBIN: 14.9 g/dL (ref 13.0–17.0)
MCH: 27.9 pg (ref 26.0–34.0)
MCHC: 32.4 g/dL (ref 30.0–36.0)
MCV: 86.1 fL (ref 78.0–100.0)
PLATELETS: 205 10*3/uL (ref 150–400)
RBC: 5.34 MIL/uL (ref 4.22–5.81)
RDW: 14.5 % (ref 11.5–15.5)
WBC: 4.8 10*3/uL (ref 4.0–10.5)

## 2015-09-25 LAB — LIPASE, BLOOD: Lipase: 27 U/L (ref 11–51)

## 2015-09-25 MED ORDER — OXYCODONE-ACETAMINOPHEN 10-325 MG PO TABS: 1.0000 | ORAL_TABLET | Freq: Four times a day (QID) | ORAL | Status: DC | PRN

## 2015-09-25 MED ORDER — TAMSULOSIN HCL 0.4 MG PO CAPS: 0.4000 mg | ORAL_CAPSULE | Freq: Every day | ORAL | Status: DC

## 2015-09-25 MED ORDER — TAMSULOSIN HCL 0.4 MG PO CAPS
0.4000 mg | ORAL_CAPSULE | Freq: Once | ORAL | Status: AC
  Administered 2015-09-25: 0.4 mg via ORAL
  Filled 2015-09-25: qty 1

## 2015-09-25 MED ORDER — KETOROLAC TROMETHAMINE 30 MG/ML IJ SOLN
30.0000 mg | Freq: Once | INTRAMUSCULAR | Status: AC
  Administered 2015-09-25: 30 mg via INTRAVENOUS
  Filled 2015-09-25: qty 1

## 2015-09-25 MED ORDER — HYDROMORPHONE HCL 1 MG/ML IJ SOLN
1.0000 mg | Freq: Once | INTRAMUSCULAR | Status: AC
  Administered 2015-09-25: 1 mg via INTRAVENOUS
  Filled 2015-09-25: qty 1

## 2015-09-25 MED ORDER — SODIUM CHLORIDE 0.9 % IV BOLUS (SEPSIS)
1000.0000 mL | Freq: Once | INTRAVENOUS | Status: AC
  Administered 2015-09-25: 1000 mL via INTRAVENOUS

## 2015-09-25 MED ORDER — ONDANSETRON HCL 4 MG/2ML IJ SOLN
4.0000 mg | Freq: Once | INTRAMUSCULAR | Status: AC
  Administered 2015-09-25: 4 mg via INTRAVENOUS
  Filled 2015-09-25: qty 2

## 2015-09-25 MED ORDER — HYDROMORPHONE HCL 1 MG/ML IJ SOLN: 1.0000 mg | Freq: Once | INTRAMUSCULAR | Status: DC | PRN

## 2015-09-25 MED ORDER — ONDANSETRON HCL 4 MG PO TABS: 4.0000 mg | ORAL_TABLET | Freq: Four times a day (QID) | ORAL | Status: DC

## 2015-09-25 MED ORDER — OXYCODONE-ACETAMINOPHEN 5-325 MG PO TABS
1.0000 | ORAL_TABLET | Freq: Once | ORAL | Status: AC
  Administered 2015-09-25: 1 via ORAL
  Filled 2015-09-25: qty 1

## 2015-09-25 MED ORDER — OXYCODONE-ACETAMINOPHEN 5-325 MG PO TABS
2.0000 | ORAL_TABLET | Freq: Once | ORAL | Status: AC
  Administered 2015-09-25: 2 via ORAL
  Filled 2015-09-25: qty 2

## 2015-09-25 MED ORDER — ONDANSETRON 4 MG PO TBDP
8.0000 mg | ORAL_TABLET | Freq: Once | ORAL | Status: AC
  Administered 2015-09-25: 8 mg via ORAL
  Filled 2015-09-25: qty 2

## 2015-09-25 NOTE — ED Notes (Signed)
The pt is c/o   Rt flank and abd pain since 1500.  Hx of kidney stones  He had a vicodin  At [facility]

## 2015-09-25 NOTE — ED Notes (Signed)
Pt unable to void  Last time was 1300

## 2015-09-25 NOTE — ED Provider Notes (Signed)
CSN: [id number]     Arrival date & time 09/25/15  1802 History   First MD Initiated Contact with Patient 09/25/15 1854     Chief Complaint  Patient presents with  . Flank Pain      HPI  Patient presents for evaluation of right flank pain. Sudden onset while sleeping at 3:00 Septra name. History of kidney stones many years ago. Sudden sudden pain colicky and intermittent rating to his right groin and upper aspect of his right leg. Nausea but no vomiting.  Past Medical History  Diagnosis Date  . Hypertension   . Obesity   . Hypogonadism male   . Allergic rhinitis   . Diabetes mellitus   . Asthma   . Stones in the urinary tract   . Meniscus tear     rt knee  . Renal disorder    Past Surgical History  Procedure Laterality Date  . Knee arthroscopy  02/13/2012    Procedure: ARTHROSCOPY KNEE;  Surgeon: Alta Corning, MD;  Location: [place];  Service: Orthopedics;  Laterality: Right;  . Esophagogastroduodenoscopy N/A 11/16/2014    Procedure: ESOPHAGOGASTRODUODENOSCOPY (EGD);  Surgeon: Laurence Spates, MD;  Location: [facility];  Service: Endoscopy;  Laterality: N/A;   Family History  Problem Relation Age of Onset  . Diabetes Mother    Social History  Substance Use Topics  . Smoking status: Never Smoker   . Smokeless tobacco: Never Used  . Alcohol Use: No    Review of Systems  Constitutional: Negative for fever, chills, diaphoresis, appetite change and fatigue.  HENT: Negative for mouth sores, sore throat and trouble swallowing.   Eyes: Negative for visual disturbance.  Respiratory: Negative for cough, chest tightness, shortness of breath and wheezing.   Cardiovascular: Negative for chest pain.  Gastrointestinal: Positive for nausea and abdominal pain. Negative for vomiting, diarrhea and abdominal distention.  Endocrine: Negative for polydipsia, polyphagia and polyuria.  Genitourinary: Positive for dysuria. Negative for frequency and hematuria.  Musculoskeletal: Negative for  gait problem.  Skin: Negative for color change, pallor and rash.  Neurological: Negative for dizziness, syncope, light-headedness and headaches.  Hematological: Does not bruise/bleed easily.  Psychiatric/Behavioral: Negative for behavioral problems and confusion.      Allergies  Lisinopril  Home Medications   Prior to Admission medications   Medication Sig Start Date End Date Taking? Authorizing Provider  amLODipine (NORVASC) 10 MG tablet TAKE 1 TABLET BY MOUTH EVERY DAY 05/11/15  Yes Denita Lung, MD  carvedilol (COREG) 12.5 MG tablet TAKE 1 TABLET BY MOUTH TWICE DAILY WITH A MEAL 05/11/15  Yes Denita Lung, MD  ciprofloxacin (CIPRO) 500 MG tablet Take 1 tablet (500 mg total) by mouth 2 (two) times daily. 08/12/15  Yes Denita Lung, MD  clindamycin (CLEOCIN) 300 MG capsule Take 1 capsule (300 mg total) by mouth 3 (three) times daily. 08/12/15  Yes Denita Lung, MD  losartan-hydrochlorothiazide [facility]) 100-12.5 MG tablet TAKE 1 TABLET BY MOUTH EVERY DAY 05/11/15  Yes Denita Lung, MD  naproxen sodium (ANAPROX) 220 MG tablet Take 220 mg by mouth 2 (two) times daily as needed (for pain).   Yes Historical Provider, MD  pioglitazone-metformin (ACTOPLUS MET) 15-500 MG tablet Take 1 tablet by mouth 2 (two) times daily. 06/22/15  Yes Denita Lung, MD  rosuvastatin (CRESTOR) 40 MG tablet Take 1 tablet (40 mg total) by mouth daily. Patient taking differently: Take 20 mg by mouth at bedtime.  03/18/14  Yes Denita Lung, MD  VIAGRA 100 MG tablet Reported on 08/12/2015 04/23/15  Yes Historical Provider, MD  clotrimazole-betamethasone (LOTRISONE) cream Apply 1 application topically 2 (two) times daily. Patient not taking: Reported on 08/12/2015 03/29/15   [facility], PA-C  glucose blood test strip Test twice a day. Pt uses a one touch ultra meter 08/14/15   Denita Lung, MD  Lancets 30G MISC Test twice a day. Pt uses a one touch ultra meter 08/14/15   Denita Lung, MD  mupirocin  ointment (BACTROBAN) 2 % Apply to wound twice a day. Patient not taking: Reported on 08/12/2015 05/12/15   Max T Hyatt, DPM  NEEDLE, DISP, 22 G (BD DISP NEEDLES) 22G X 1-1/2" MISC For testosterone injections.  Use one needle to draw up testosterone and one to use for injection. 04/30/15   Denita Lung, MD  ondansetron (ZOFRAN) 4 MG tablet Take 1 tablet (4 mg total) by mouth every 6 (six) hours. 09/25/15   Tanna Furry, MD  oxyCODONE-acetaminophen (PERCOCET) 10-325 MG tablet Take 1 tablet by mouth every 6 (six) hours as needed for pain. 09/25/15   Tanna Furry, MD  Syringe, Disposable, 3 ML MISC For testosterone injections. 04/30/15   Denita Lung, MD  tamsulosin (FLOMAX) 0.4 MG CAPS capsule Take 1 capsule (0.4 mg total) by mouth daily. 09/25/15   Tanna Furry, MD  testosterone cypionate (DEPOTESTOSTERONE CYPIONATE) 200 MG/ML injection Inject 1 mL (200 mg total) into the muscle every 14 (fourteen) days. Patient not taking: Reported on 08/12/2015 06/12/15   Denita Lung, MD  testosterone cypionate (DEPOTESTOSTERONE CYPIONATE) 200 MG/ML injection Inject 2 mLs (400 mg total) into the muscle every 14 (fourteen) days. Patient not taking: Reported on 08/12/2015 06/12/15   Denita Lung, MD  triamcinolone (KENALOG) 0.025 % cream Reported on 08/12/2015 04/29/15   Historical Provider, MD  triamcinolone cream (KENALOG) 0.1 % Apply 1 application topically 2 (two) times daily. Patient not taking: Reported on 08/12/2015 05/12/15   Max T Hyatt, DPM   BP 159/98 mmHg  Pulse 84  Temp(Src) 97.9 F (36.6 C) (Oral)  Resp 22  Ht _0  (1.981 m)  Wt 429 lb (194.593 kg)  BMI 49.59 kg/m2  SpO2 98% Physical Exam  Constitutional: He is oriented to person, place, and time. He appears well-developed and well-nourished. No distress.  HENT:  Head: Normocephalic.  Eyes: Conjunctivae are normal. Pupils are equal, round, and reactive to light. No scleral icterus.  Neck: Normal range of motion. Neck supple. No thyromegaly present.    Cardiovascular: Normal rate and regular rhythm.  Exam reveals no gallop and no friction rub.   No murmur heard. Pulmonary/Chest: Effort normal and breath sounds normal. No respiratory distress. He has no wheezes. He has no rales.  Abdominal: Soft. Bowel sounds are normal. He exhibits no distension. There is no tenderness. There is no rebound.    Musculoskeletal: Normal range of motion.  Neurological: He is alert and oriented to person, place, and time.  Skin: Skin is warm and dry. No rash noted.  Psychiatric: He has a normal mood and affect. His behavior is normal.    ED Course  Procedures (including critical care time) Labs Review Labs Reviewed  COMPREHENSIVE METABOLIC PANEL - Abnormal; Notable for the following:    Glucose, Bld 140 (*)    Creatinine, Ser 1.56 (*)    ALT 16 (*)    GFR calc non Af Amer 52 (*)    All other components within normal limits  LIPASE, BLOOD  CBC  URINALYSIS, ROUTINE W REFLEX MICROSCOPIC (NOT AT [facility])    Imaging Review Ct Renal Stone Study  09/25/2015  CLINICAL DATA:  Right flank pain with nausea and vomiting. Onset today. EXAM: CT ABDOMEN AND PELVIS WITHOUT CONTRAST TECHNIQUE: Multidetector CT imaging of the abdomen and pelvis was performed following the standard protocol without IV contrast. COMPARISON:  01/03/2004 FINDINGS: There is an obstructing 3 x 4 mm calculus at the right ureterovesical junction with moderate hydronephrosis and hydroureter. No other acute findings are evident in the abdomen or pelvis. There is a 2 mm midpole collecting system calculus in the right kidney. There are unremarkable unenhanced appearances of the liver, gallbladder, bile ducts, pancreas, spleen, adrenals and left kidney. The abdominal aorta is normal in caliber without atherosclerotic calcification. Bowel is unremarkable except for mild uncomplicated colonic diverticulosis. There is no significant abnormality in the lower chest. There is no significant musculoskeletal  abnormality. IMPRESSION: Obstructing 3 x 4 mm right UVJ calculus with moderate hydronephrosis. Right nephrolithiasis. Electronically Signed   By: Andreas Newport M.D.   On: 09/25/2015 20:15   I have personally reviewed and evaluated these images and lab results as part of my medical decision-making.   EKG Interpretation None      MDM   Final diagnoses:  Kidney stone    Symptoms improved after IV medications. I discussed plan of treatment with the patient. Discharged home with instructions to follow-up with urology 5-7 days with any continued pain.    Tanna Furry, MD 09/26/15 0001

## 2015-09-25 NOTE — Discharge Instructions (Signed)
All urologist for follow-up appointment is stone not passed in next 5-7 days.  Kidney Stones Kidney stones (urolithiasis) are deposits that form inside your kidneys. The intense pain is caused by the stone moving through the urinary tract. When the stone moves, the ureter goes into spasm around the stone. The stone is usually passed in the urine.  CAUSES   A disorder that makes certain neck glands produce too much parathyroid hormone (primary hyperparathyroidism).  A buildup of uric acid crystals, similar to gout in your joints.  Narrowing (stricture) of the ureter.  A kidney obstruction present at birth (congenital obstruction).  Previous surgery on the kidney or ureters.  Numerous kidney infections. SYMPTOMS   Feeling sick to your stomach (nauseous).  Throwing up (vomiting).  Blood in the urine (hematuria).  Pain that usually spreads (radiates) to the groin.  Frequency or urgency of urination. DIAGNOSIS   Taking a history and physical exam.  Blood or urine tests.  CT scan.  Occasionally, an examination of the inside of the urinary bladder (cystoscopy) is performed. TREATMENT   Observation.  Increasing your fluid intake.  Extracorporeal shock wave lithotripsy--This is a noninvasive procedure that uses shock waves to break up kidney stones.  Surgery may be needed if you have severe pain or persistent obstruction. There are various surgical procedures. Most of the procedures are performed with the use of small instruments. Only small incisions are needed to accommodate these instruments, so recovery time is minimized. The size, location, and chemical composition are all important variables that will determine the proper choice of action for you. Talk to your health care provider to better understand your situation so that you will minimize the risk of injury to yourself and your kidney.  HOME CARE INSTRUCTIONS   Drink enough water and fluids to keep your urine clear or  pale yellow. This will help you to pass the stone or stone fragments.  Strain all urine through the provided strainer. Keep all particulate matter and stones for your health care provider to see. The stone causing the pain may be as small as a grain of salt. It is very important to use the strainer each and every time you pass your urine. The collection of your stone will allow your health care provider to analyze it and verify that a stone has actually passed. The stone analysis will often identify what you can do to reduce the incidence of recurrences.  Only take over-the-counter or prescription medicines for pain, discomfort, or fever as directed by your health care provider.  Keep all follow-up visits as told by your health care provider. This is important.  Get follow-up X-rays if required. The absence of pain does not always mean that the stone has passed. It may have only stopped moving. If the urine remains completely obstructed, it can cause loss of kidney function or even complete destruction of the kidney. It is your responsibility to make sure X-rays and follow-ups are completed. Ultrasounds of the kidney can show blockages and the status of the kidney. Ultrasounds are not associated with any radiation and can be performed easily in a matter of minutes.  Make changes to your daily diet as told by your health care provider. You may be told to:  Limit the amount of salt that you eat.  Eat 5 or more servings of fruits and vegetables each day.  Limit the amount of meat, poultry, fish, and eggs that you eat.  Collect a 24-hour urine sample as told  by your health care provider.You may need to collect another urine sample every 6-12 months. SEEK MEDICAL CARE IF:  You experience pain that is progressive and unresponsive to any pain medicine you have been prescribed. SEEK IMMEDIATE MEDICAL CARE IF:   Pain cannot be controlled with the prescribed medicine.  You have a fever or shaking  chills.  The severity or intensity of pain increases over 18 hours and is not relieved by pain medicine.  You develop a new onset of abdominal pain.  You feel faint or pass out.  You are unable to urinate.   This information is not intended to replace advice given to you by your health care provider. Make sure you discuss any questions you have with your health care provider.   Document Released: 05/09/2005 Document Revised: 01/28/2015 Document Reviewed: 10/10/2012 Elsevier Interactive Patient Education [employer].

## 2015-09-28 ENCOUNTER — Telehealth: Payer: Self-pay | Admitting: Family Medicine

## 2015-09-28 NOTE — Telephone Encounter (Signed)
Dr. Redmond School advised that the patient needed an appointment here regarding stone.  Called pt twice, no answer, no voice mail.

## 2015-09-30 ENCOUNTER — Ambulatory Visit (INDEPENDENT_AMBULATORY_CARE_PROVIDER_SITE_OTHER): Payer: 59 | Admitting: Family Medicine

## 2015-09-30 DIAGNOSIS — E118 Type 2 diabetes mellitus with unspecified complications: Secondary | ICD-10-CM | POA: Diagnosis not present

## 2015-09-30 DIAGNOSIS — N200 Calculus of kidney: Secondary | ICD-10-CM | POA: Diagnosis not present

## 2015-09-30 DIAGNOSIS — L03116 Cellulitis of left lower limb: Secondary | ICD-10-CM

## 2015-09-30 MED ORDER — DOXYCYCLINE HYCLATE 100 MG PO TABS: 100.0000 mg | ORAL_TABLET | Freq: Two times a day (BID) | ORAL | Status: DC

## 2015-09-30 NOTE — Progress Notes (Signed)
   Subjective:    Patient ID: Guy Taylor, male    DOB: 01/24/1971, 45 y.o.   MRN: [id number]  HPI   he is here for a recheck. Recently he was seen in the emergency room and evaluated for renal stone. The CT scan did show a stone present. He states that he is no longer having any discomfort and did note the stone being passed several days ago. He had a previous episode of stone 8-10 years ago. He also finished the antibiotic for his cellulitis of the left lateral lower leg approximately 2 weeks ago. Since then he has noted no real improvement. He also states that he is concerned about his diabetes. He notes blood sugars 2 hours after eating in the 180 range. He continues on medications listed in the chart.   Review of Systems     Objective:   Physical Exam  exam of the left lateral calf area does show a 2 cm area of continued minimal clear drainage with some surrounding induration that is tender to palpation. It is not red. Hemoglobin A1c is 7.2       Assessment & Plan:  Cellulitis of left lower extremity - Plan: doxycycline (VIBRA-TABS) 100 MG tablet  Renal stone  Type 2 diabetes mellitus with complication, without long-term current use of insulin ([place])  since he has been making no continued progress with the leg, I will give him another month  Of a different antibiotic and see how he does with doxycycline. He will remain physically active as he notes this does help with the healing. Discussed treatment of the kidney stone and since he has had 2 episodes far apart, no further intervention. Did mention the fact that there was a small stone present in his renal pelvis. Congratulated him on the improvement in his diabetes status and strongly encouraged him to continue with his lifestyle modification help with weight loss.

## 2015-10-22 ENCOUNTER — Other Ambulatory Visit: Payer: Self-pay | Admitting: Family Medicine

## 2015-12-11 ENCOUNTER — Other Ambulatory Visit: Payer: Self-pay

## 2015-12-17 ENCOUNTER — Ambulatory Visit: Payer: 59 | Admitting: Family Medicine

## 2015-12-18 ENCOUNTER — Encounter: Payer: Self-pay | Admitting: Family Medicine

## 2015-12-28 ENCOUNTER — Other Ambulatory Visit: Payer: Self-pay | Admitting: Family Medicine

## 2016-02-25 ENCOUNTER — Other Ambulatory Visit: Payer: Self-pay | Admitting: Family Medicine

## 2016-03-03 ENCOUNTER — Ambulatory Visit (INDEPENDENT_AMBULATORY_CARE_PROVIDER_SITE_OTHER): Payer: 59 | Admitting: Family Medicine

## 2016-03-03 ENCOUNTER — Encounter: Payer: Self-pay | Admitting: Family Medicine

## 2016-03-03 VITALS — BP 120/60 | HR 96 | Ht 78.0 in | Wt >= 6400 oz

## 2016-03-03 DIAGNOSIS — E1169 Type 2 diabetes mellitus with other specified complication: Secondary | ICD-10-CM | POA: Diagnosis not present

## 2016-03-03 DIAGNOSIS — E1159 Type 2 diabetes mellitus with other circulatory complications: Secondary | ICD-10-CM

## 2016-03-03 DIAGNOSIS — I10 Essential (primary) hypertension: Secondary | ICD-10-CM | POA: Diagnosis not present

## 2016-03-03 DIAGNOSIS — E118 Type 2 diabetes mellitus with unspecified complications: Secondary | ICD-10-CM | POA: Diagnosis not present

## 2016-03-03 DIAGNOSIS — E785 Hyperlipidemia, unspecified: Secondary | ICD-10-CM

## 2016-03-03 DIAGNOSIS — N200 Calculus of kidney: Secondary | ICD-10-CM | POA: Insufficient documentation

## 2016-03-03 DIAGNOSIS — E291 Testicular hypofunction: Secondary | ICD-10-CM | POA: Diagnosis not present

## 2016-03-03 DIAGNOSIS — I152 Hypertension secondary to endocrine disorders: Secondary | ICD-10-CM

## 2016-03-03 LAB — POCT GLYCOSYLATED HEMOGLOBIN (HGB A1C): Hemoglobin A1C: 6.6

## 2016-03-03 NOTE — Progress Notes (Signed)
  Subjective:    Patient ID: Guy Taylor, male    DOB: 1970-07-09, 45 y.o.   MRN: [id number]  Guy Taylor is a 45 y.o. male who presents for follow-up of Type 2 diabetes mellitus.  Patient is not checking home blood sugars.   Home blood sugar records: 180 in Am How often is blood sugars being checked: Daily usually in the morning Current symptoms/problems  Daily foot checks: yes   Any foot concerns: none Last eye exam: 2016 will be scheduling an eye exam soon Exercise: none . He states that his work keeps him to busy and he cannot exercise. He has not been taking his testosterone in several months. He is on a new insurance and plans to check and see which preparation they will cover and then will call me. He has made some dietary changes and unfortunately now is getting at least 2 of his meals through smoothie which is mainly fruit and spinach. He continues on his blood pressure medications as well as. Glitazone/metformin. He also continues on Flomax, Coreg, amlodipine. He has lost a few pounds. Review of the record also shows a history of a renal stone in May of this year.  The following portions of the patient's history were reviewed and updated as appropriate: allergies, current medications, past medical history, past social history and problem list.  ROS as in subjective above.     Objective:    Physical Exam Alert and in no distress otherwise not examined.   Lab Review Diabetic Labs Latest Ref Rng & Units 03/03/2016 09/25/2015 04/28/2015 12/11/2014 11/15/2014  HbA1c - 6.6 - 7.3 7.4(H) -  Microalbumin mg/L - - - - -  Micro/Creat Ratio - - - - - -  Chol 125 - 200 mg/dL - - 204(H) - -  HDL >=40 mg/dL - - 59 - -  Calc LDL <130 mg/dL - - 131(H) - -  Triglycerides <150 mg/dL - - 70 - -  Creatinine 0.61 - 1.24 mg/dL - 1.56(H) - - 1.30(H)   BP/Weight 03/03/2016 09/25/2015 08/12/2015 06/16/2015 11/19/5282  Systolic BP 132 440 102 725 -  Diastolic BP 60 98 82 366 -  Wt. (Lbs) 421 429  420.8 - 441.4  BMI 48.65 49.59 48.64 - 51.02   Foot/eye exam completion dates Latest Ref Rng & Units 09/12/2014  Eye Exam No Retinopathy No Retinopathy  Foot Form Completion - -  A1c is 6.6.  Guy Taylor  reports that he has never smoked. He has never used smokeless tobacco. He reports that he does not drink alcohol or use drugs.     Assessment & Plan:    Diabetic complication ([place]) - Plan: HgB A1c  Renal stone  Hyperlipidemia associated with type 2 diabetes mellitus ([place])  Hypertension associated with diabetes ([place])  Hypogonadism male  Morbid obesity due to excess calories ([place])   1. Rx changes: none 2. Education: Reviewed 'ABCs' of diabetes management (respective goals in parentheses):  A1C (<7), blood pressure (<130/80), and cholesterol (LDL <100). 3. Compliance at present is estimated to be fair. Efforts to improve compliance (if necessary) will be directed at increased exercise. Also encouraged him to have only one smoothie per day and work more making permanent dietary changes. Discussed returning to nutritionist however at this point he is not interested. 4. Follow up: 4 months Encouraged him to continue with his weight reduction.

## 2016-03-03 NOTE — Patient Instructions (Signed)
Use the information that nutritionist gave you and if he wanted do a smoothie just once per day

## 2016-03-21 ENCOUNTER — Telehealth: Payer: Self-pay | Admitting: Family Medicine

## 2016-03-21 ENCOUNTER — Other Ambulatory Visit: Payer: Self-pay | Admitting: Family Medicine

## 2016-03-21 MED ORDER — AMLODIPINE BESYLATE 10 MG PO TABS: 10.0000 mg | ORAL_TABLET | Freq: Every day | ORAL | 3 refills | Status: DC

## 2016-03-21 NOTE — Telephone Encounter (Signed)
[facility] called and stated electronic refill failed to go thru. Pt needs refill on Amlodipine 10 mg. Please send to [facility] on [address] and they can be reached at [phone]

## 2016-04-19 ENCOUNTER — Ambulatory Visit (INDEPENDENT_AMBULATORY_CARE_PROVIDER_SITE_OTHER): Payer: 59

## 2016-04-19 ENCOUNTER — Encounter: Payer: Self-pay | Admitting: Podiatry

## 2016-04-19 ENCOUNTER — Ambulatory Visit (INDEPENDENT_AMBULATORY_CARE_PROVIDER_SITE_OTHER): Payer: 59 | Admitting: Podiatry

## 2016-04-19 VITALS — BP 137/80 | HR 74 | Resp 16

## 2016-04-19 DIAGNOSIS — M779 Enthesopathy, unspecified: Secondary | ICD-10-CM

## 2016-04-19 DIAGNOSIS — M722 Plantar fascial fibromatosis: Secondary | ICD-10-CM | POA: Diagnosis not present

## 2016-04-19 MED ORDER — MELOXICAM 15 MG PO TABS: 15.0000 mg | ORAL_TABLET | Freq: Every day | ORAL | 3 refills | Status: DC

## 2016-04-19 NOTE — Patient Instructions (Signed)

## 2016-04-20 NOTE — Progress Notes (Signed)
He presents today for chief complaint of bilateral foot pain. Assessment gone over the past several weeks and it just hurts terribly.  Objective: Vital signs stable in 3 pulses are palpable. Neurologic sensorium is intact. Deep tendon reflexes are intact. Muscle strength is normal bilateral. He has severe pain on palpation medial calcaneal tubercles bilateral. Radial mastectomy today demonstrate no signs of fractures. Osseous immature individual does demonstrate soft tissue increase in density of the plantar fascia continue insertion site consistent with plantar fasciitis. He cutaneous evaluation demonstrates no open lesions or wounds.  Assessment: Plantar fasciitis bilateral.  Plan: Injected the bilateral heels today placing the plantar fascia braces and a night splint. Dispensed a prescription for mobic

## 2016-04-28 ENCOUNTER — Ambulatory Visit (INDEPENDENT_AMBULATORY_CARE_PROVIDER_SITE_OTHER): Payer: 59 | Admitting: Family Medicine

## 2016-04-28 ENCOUNTER — Encounter: Payer: Self-pay | Admitting: Family Medicine

## 2016-04-28 VITALS — BP 116/72 | HR 83 | Ht 78.0 in | Wt >= 6400 oz

## 2016-04-28 DIAGNOSIS — E1169 Type 2 diabetes mellitus with other specified complication: Secondary | ICD-10-CM | POA: Diagnosis not present

## 2016-04-28 DIAGNOSIS — I152 Hypertension secondary to endocrine disorders: Secondary | ICD-10-CM

## 2016-04-28 DIAGNOSIS — I10 Essential (primary) hypertension: Secondary | ICD-10-CM

## 2016-04-28 DIAGNOSIS — E785 Hyperlipidemia, unspecified: Secondary | ICD-10-CM | POA: Diagnosis not present

## 2016-04-28 DIAGNOSIS — N529 Male erectile dysfunction, unspecified: Secondary | ICD-10-CM | POA: Diagnosis not present

## 2016-04-28 DIAGNOSIS — E118 Type 2 diabetes mellitus with unspecified complications: Secondary | ICD-10-CM

## 2016-04-28 DIAGNOSIS — Z23 Encounter for immunization: Secondary | ICD-10-CM

## 2016-04-28 DIAGNOSIS — N500 Atrophy of testis: Secondary | ICD-10-CM | POA: Insufficient documentation

## 2016-04-28 DIAGNOSIS — Z0000 Encounter for general adult medical examination without abnormal findings: Secondary | ICD-10-CM | POA: Diagnosis not present

## 2016-04-28 DIAGNOSIS — E1159 Type 2 diabetes mellitus with other circulatory complications: Secondary | ICD-10-CM

## 2016-04-28 DIAGNOSIS — E291 Testicular hypofunction: Secondary | ICD-10-CM | POA: Diagnosis not present

## 2016-04-28 LAB — LIPID PANEL
CHOLESTEROL: 189 mg/dL (ref <200)
HDL: 73 mg/dL (ref >40)
LDL Cholesterol: 106 mg/dL — ABNORMAL HIGH (ref <100)
TRIGLYCERIDES: 49 mg/dL (ref <150)
Total CHOL/HDL Ratio: 2.6 Ratio (ref <5.0)
VLDL: 10 mg/dL (ref <30)

## 2016-04-28 NOTE — Progress Notes (Signed)
Subjective:    Patient ID: Guy Taylor, male    DOB: May 30, 1970, 46 y.o.   MRN: [id number]  HPI He is here for complete examination. Shortly after seeing me he had a wake-up call concerning his health and well-being and since then has been exercising more regularly and made dietary changes. He has lost several pounds. He feels very good about this and does note more energy. He continues on his blood pressure medications as well as meds for his underlying diabetes. Does have a history of hypogonadism but presently is on no medications. He does occasionally use Viagra. His work and home life are going quite well. Family and social history as well as health maintenance and immunizations were reviewed. He has no other concerns or complaints.   Review of Systems  All other systems reviewed and are negative.      Objective:   Physical Exam BP 116/72   Pulse 83   Ht 6' 6"  (1.981 m)   Wt (!) 414 lb (187.8 kg)   SpO2 97%   BMI 47.84 kg/m   General Appearance:    Alert, cooperative, no distress, appears stated age  Head:    Normocephalic, without obvious abnormality, atraumatic  Eyes:    PERRL, conjunctiva/corneas clear, EOM's intact, fundi    benign  Ears:    Normal TM's and external ear canals  Nose:   Nares normal, mucosa normal, no drainage or sinus   tenderness  Throat:   Lips, mucosa, and tongue normal; teeth and gums normal  Neck:   Supple, no lymphadenopathy;  thyroid:  no   enlargement/tenderness/nodules; no carotid   bruit or JVD     Lungs:     Clear to auscultation bilaterally without wheezes, rales or     ronchi; respirations unlabored      Heart:    Regular rate and rhythm, S1 and S2 normal, no murmur, rub   or gallop     Abdomen:     Soft, non-tender, nondistended, normoactive bowel sounds,    no masses, no hepatosplenomegaly  Genitalia:    Normal male external genitalia without lesions.  Testicles without masses but atrophied  No inguinal hernias.  Rectal:    Deferred. Guaiac cards given.   Extremities:   No clubbing, cyanosis or edema  Pulses:   2+ and symmetric all extremities  Skin:   Skin color, texture, turgor normal, no rashes or lesions  Lymph nodes:   Cervical, supraclavicular, and axillary nodes normal  Neurologic:   CNII-XII intact, normal strength, sensation and gait; reflexes 2+ and symmetric throughout          Psych:   Normal mood, affect, hygiene and grooming.          Assessment & Plan:  Routine general medical examination at a health care facility  Hyperlipidemia associated with type 2 diabetes mellitus ([place]) - Plan: Lipid panel  Morbid obesity due to excess calories ([place])  Type 2 diabetes mellitus with complication, without long-term current use of insulin ([place])  Hypertension associated with diabetes ([place])  Hypogonadism male  Morbid obesity ([place])  Need for prophylactic vaccination and inoculation against influenza - Plan: Flu Vaccine QUAD 36+ mos PF IM (Fluarix & Fluzone Quad PF)  Bilateral testicular atrophy  Vasculogenic erectile dysfunction, unspecified vasculogenic erectile dysfunction type I congratulated him on his wake-up call and encouraged him to continue with this. Explained that it would be a several year process also recommended he get rid of all the close  as he continues to shrink. Recheck here in several months.

## 2016-05-11 ENCOUNTER — Other Ambulatory Visit (INDEPENDENT_AMBULATORY_CARE_PROVIDER_SITE_OTHER): Payer: 59

## 2016-05-11 DIAGNOSIS — Z1211 Encounter for screening for malignant neoplasm of colon: Secondary | ICD-10-CM

## 2016-05-11 LAB — HEMOCCULT GUIAC POC 1CARD (OFFICE)
Card #3 Fecal Occult Blood, POC: NEGATIVE
FECAL OCCULT BLD: NEGATIVE
Fecal Occult Blood, POC: NEGATIVE

## 2016-05-12 ENCOUNTER — Ambulatory Visit (INDEPENDENT_AMBULATORY_CARE_PROVIDER_SITE_OTHER): Payer: 59 | Admitting: Family Medicine

## 2016-05-12 ENCOUNTER — Ambulatory Visit
Admission: RE | Admit: 2016-05-12 | Discharge: 2016-05-12 | Disposition: A | Discharge status: Home / Self Care | Payer: 59 | Source: Ambulatory Visit | Attending: Family Medicine | Admitting: Family Medicine

## 2016-05-12 ENCOUNTER — Encounter: Payer: Self-pay | Admitting: Family Medicine

## 2016-05-12 VITALS — BP 124/82 | HR 88 | Ht 78.0 in | Wt >= 6400 oz

## 2016-05-12 DIAGNOSIS — M79672 Pain in left foot: Secondary | ICD-10-CM

## 2016-05-12 MED ORDER — TRAMADOL HCL 50 MG PO TABS: 50.0000 mg | ORAL_TABLET | Freq: Three times a day (TID) | ORAL | 0 refills | Status: DC | PRN

## 2016-05-12 NOTE — Patient Instructions (Signed)
   Go to [facility] to get x-rays of the left foot. Keep the leg elevated (ideally above the level of the heart).  Ice it regularly. You can continue the meloxicam once daily. You can also use extra strength tylenol as needed (don't exceed the amount stated on the bottle). Use the tramadol if needed for severe pain. I also recommend using a wooden shoe (or post-op shoe--it has a hard sole, which might make it less painful with walking). Take the prescription to a medical supply store ([facility] or other).

## 2016-05-12 NOTE — Progress Notes (Signed)
Chief Complaint  Patient presents with  . Foot Injury    was squatting down to get something from under bed when he heard a loud pop and the outside top of his left foot was in excruciating pain. Pain is now going up his leg. Foot is now swollen and very painful.    Occurred around 1:55 this afternoon.   Acute onset of pain at the lateral aspect of the left mid-foot while squatting and reaching for something under the bed.  He heard a popping sound and fell forward in pain.  +swelling.  Pain has spread to the entire foot, even into the arch and bottom of the foot, up to the upper ankle area.  He denies similar problems. No treatment prior to visit--has leg somewhat elevated in exam room.  He takes meloxicam for plantar fasciitis in the right foot. Takes this once daily.  PMH, PSH reviewed.  Social History   Social History  . Marital status: Married    Spouse name: N/A  . Number of children: N/A  . Years of education: N/A   Occupational History  . Not on file.   Social History Main Topics  . Smoking status: Never Smoker  . Smokeless tobacco: Never Used  . Alcohol use No  . Drug use: No  . Sexual activity: Yes   Other Topics Concern  . Not on file   Social History Narrative   Works for [employer] and [employer], Scientist, product/process development.   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     Outpatient Encounter Prescriptions as of 05/12/2016  Medication Sig Note  . Alcohol Swabs (ALCOHOL PREP) PADS 1 each by Does not apply route 2 (two) times daily.   Marland Kitchen amLODipine (NORVASC) 10 MG tablet Take 1 tablet (10 mg total) by mouth daily.   . carvedilol (COREG) 12.5 MG tablet TAKE 1 TABLET BY MOUTH TWICE DAILY WITH A MEAL   . glucose blood test strip Test twice a day. Pt uses a one touch ultra meter   . losartan-hydrochlorothiazide (HYZAAR) 100-12.5 MG tablet TAKE 1 TABLET BY MOUTH EVERY DAY   . meloxicam (MOBIC) 15 MG tablet Take 1 tablet (15 mg total) by mouth daily.   . pioglitazone-metformin  (ACTOPLUS MET) 15-500 MG tablet TAKE 1 TABLET BY MOUTH TWICE DAILY   . rosuvastatin (CRESTOR) 40 MG tablet Take 1 tablet (40 mg total) by mouth daily. (Patient taking differently: Take 20 mg by mouth at bedtime. )   . TRUEPLUS LANCETS 30G MISC USE AS DIRECTED TO TEST TWICE DAILY   . VIAGRA 100 MG tablet Reported on 08/12/2015 04/28/2015: Received from: External Pharmacy  . [DISCONTINUED] naproxen sodium (ANAPROX) 220 MG tablet Take 220 mg by mouth 2 (two) times daily as needed (for pain). Reported on 09/30/2015    No facility-administered encounter medications on file as of 05/12/2016.    ROS: no fever, chills, URI symptoms, bleeding, other joint pains or complaints  PHYSICAL EXAM:  BP 124/82 (BP Location: Right Arm, Patient Position: Sitting, Cuff Size: Normal)   Pulse 88   Ht _0  (1.981 m)   Wt (!) 423 lb (191.9 kg)   BMI 48.88 kg/m   Well appearing, obese male in acute discomfort/pain from left foot  Left foot--trace pretibial edema 2+ pulses, brisk capillary refill Tender over the lateral aspect of the left mid-foot (along lateral 5th metatarsal).  No bruising noted. He is diffusely tender across the bottom of the foot, especially in the arch. No ecchymosis noted, only  mild swelling Normal sensation.  Limited ROM due to pain. Able to bear weight and walk down the hall (but appeared to be in significant discomfort)  ASSESSMENT/PLAN:  Left foot pain - suspect peroneus strain vs partial tear. Ice, elevate, pain control. Hard sole shoe. f/u with Dr. Redmond School next week if needed. OOW today and tomorrow - Plan: DG Foot Complete Left, traMADol (ULTRAM) 50 MG tablet  Risks/side effects of meds reviewed; caution with driving.    Go to [facility] to get x-rays of the left foot. Keep the leg elevated (ideally above the level of the heart).  Ice it regularly. You can continue the meloxicam once daily. You can also use extra strength tylenol as needed (don't exceed the amount  stated on the bottle). Use the tramadol if needed for severe pain. I also recommend using a wooden shoe (or post-op shoe--it has a hard sole, which might make it less painful with walking). Take the prescription to a medical supply store ([facility] or other).     [facility] [address]  [phone] [place]

## 2016-05-19 ENCOUNTER — Encounter: Payer: Self-pay | Admitting: Podiatry

## 2016-05-19 ENCOUNTER — Ambulatory Visit (INDEPENDENT_AMBULATORY_CARE_PROVIDER_SITE_OTHER): Payer: 59 | Admitting: Podiatry

## 2016-05-19 DIAGNOSIS — M7672 Peroneal tendinitis, left leg: Secondary | ICD-10-CM

## 2016-05-19 NOTE — Progress Notes (Signed)
[place] presents today for follow-up of his bilateral heels stating that his heels are doing much better. Currently he's having pain to the lateral aspect of his left foot he states that I felt a pop on the outside of the foot last Thursday as I bent down and put shoes on. He states that he had it checked on by his PCP who took x-rays stating that there was nothing broken and stating that there was a sprain tendon.  Objective: Vital signs are stable he is alert and oriented 3. No calf pain pulses remain palpable. He has tenderness on palpation of the peroneal tendon and the fifth metatarsal base left. I reviewed the radiographs and based on their radiographs saw no fracture.  Assessment: Peroneal tendinitis cannot rule out a stress fracture fifth metatarsal base left.  Plan: Placed him in a [place] [facility] today and encouraged him to stay off the foot as much as possible Durene Cal note for light duty. Follow up with me in 1 month.

## 2016-05-23 HISTORY — PX: OTHER SURGICAL HISTORY: SHX169

## 2016-06-02 ENCOUNTER — Ambulatory Visit (INDEPENDENT_AMBULATORY_CARE_PROVIDER_SITE_OTHER): Payer: 59

## 2016-06-02 ENCOUNTER — Encounter: Payer: Self-pay | Admitting: Podiatry

## 2016-06-02 ENCOUNTER — Ambulatory Visit (INDEPENDENT_AMBULATORY_CARE_PROVIDER_SITE_OTHER): Payer: 59 | Admitting: Podiatry

## 2016-06-02 DIAGNOSIS — M84375D Stress fracture, left foot, subsequent encounter for fracture with routine healing: Secondary | ICD-10-CM

## 2016-06-02 DIAGNOSIS — M7672 Peroneal tendinitis, left leg: Secondary | ICD-10-CM

## 2016-06-02 NOTE — Progress Notes (Signed)
He presents today for follow-up of his peroneal longus tendinitis with a possible stress fracture fifth metatarsal base of the left foot. He states that he really hasn't changed his still exquisitely painful and I cannot perform her work duties.  Objective: Vital signs are stable he is alert and oriented 3 he has pain on palpation fifth metatarsal base where the peroneal tendons passed by or attached to. I'm questioning whether we have a stress injury to the fifth metatarsal or possibly a split tear of the Proteus longus or brevis tendon. He does have plantar fasciitis which is resolving bilateral. Radiographs taken today do not demonstrate sign of fracture to the fifth metatarsal of the left foot.  Assessment: Resolving plantar fasciitis bilateral. Stress injury fifth metatarsal base with peroneal tendinitis cannot rule out tear.  Plan: Request MRI since conservative therapies have failed to render him asymptomatic.

## 2016-06-03 ENCOUNTER — Telehealth: Payer: Self-pay | Admitting: *Deleted

## 2016-06-03 NOTE — Telephone Encounter (Signed)
Faxed MRI orders to [facility], gave orders to [facility] for [facility].

## 2016-06-13 ENCOUNTER — Telehealth: Payer: Self-pay | Admitting: *Deleted

## 2016-06-13 NOTE — Telephone Encounter (Signed)
"  I calling to let you know his MRI left foot without contrast requires authorization from [facility].  He's scheduled for Wednesday at 7:50 am."

## 2016-06-15 ENCOUNTER — Ambulatory Visit
Admission: RE | Admit: 2016-06-15 | Discharge: 2016-06-15 | Disposition: A | Discharge status: Home / Self Care | Payer: 59 | Source: Ambulatory Visit | Attending: Podiatry | Admitting: Podiatry

## 2016-06-15 DIAGNOSIS — M84375D Stress fracture, left foot, subsequent encounter for fracture with routine healing: Secondary | ICD-10-CM

## 2016-06-15 DIAGNOSIS — M7672 Peroneal tendinitis, left leg: Secondary | ICD-10-CM

## 2016-06-15 NOTE — Telephone Encounter (Signed)
MRI was authorized.

## 2016-06-16 ENCOUNTER — Encounter: Payer: Self-pay | Admitting: Family Medicine

## 2016-06-16 ENCOUNTER — Ambulatory Visit (INDEPENDENT_AMBULATORY_CARE_PROVIDER_SITE_OTHER): Payer: 59 | Admitting: Family Medicine

## 2016-06-16 VITALS — BP 110/80 | HR 83 | Temp 98.3°F | Wt >= 6400 oz

## 2016-06-16 DIAGNOSIS — K3189 Other diseases of stomach and duodenum: Secondary | ICD-10-CM | POA: Diagnosis not present

## 2016-06-16 MED ORDER — HYOSCYAMINE SULFATE ER 0.375 MG PO TB12: 0.3750 mg | ORAL_TABLET | Freq: Two times a day (BID) | ORAL | 0 refills | Status: DC

## 2016-06-16 NOTE — Progress Notes (Signed)
   Subjective:    Patient ID: Guy Taylor, male    DOB: [birth year], 46 y.o.   MRN: [id number]  HPI He complains of a two-week history of midepigastric pain that is intermittent in nature. No associated nausea, vomiting, diarrhea. He cannot relate this to any particular foods. He has not tried any medications for this. No previous history of difficulty with this.   Review of Systems     Objective:   Physical Exam Alert and in no distress. Cardiac exam shows regular rhythm without murmurs or gallops. Lungs are clear to auscultation. Abdominal exam shows slight midepigastric tenderness with normal bowel sounds and no rebound       Assessment & Plan:  Spasm of GI tract - Plan: hyoscyamine (LEVBID) 0.375 MG 12 hr tablet I will treat him initially like is having GI spasm. He will be given a week of the medication and if continued difficulty, further evaluation will be needed.

## 2016-06-17 NOTE — Telephone Encounter (Addendum)
-----   Message from Garrel Ridgel, [place] sent at 06/15/2016 11:33 AM EST ----- Send for over read and inform patient of the delay.  Looks like tendon tears. 06/17/2016-Left message informing pt of Dr.Hyatt's orders and explained the delay. 06/20/2016-Mailed copy of MRI disc to [facility].

## 2016-06-22 ENCOUNTER — Encounter: Payer: Self-pay | Admitting: Podiatry

## 2016-07-04 ENCOUNTER — Ambulatory Visit (INDEPENDENT_AMBULATORY_CARE_PROVIDER_SITE_OTHER): Payer: 59 | Admitting: Family Medicine

## 2016-07-04 ENCOUNTER — Encounter: Payer: Self-pay | Admitting: Family Medicine

## 2016-07-04 VITALS — BP 120/78 | HR 76 | Wt >= 6400 oz

## 2016-07-04 DIAGNOSIS — R1013 Epigastric pain: Secondary | ICD-10-CM | POA: Diagnosis not present

## 2016-07-04 NOTE — Patient Instructions (Signed)
Take 2 Prilosec daily for the next week and if no improvement call me and then I'll refer you to a GI doctor

## 2016-07-04 NOTE — Progress Notes (Signed)
   Subjective:    Patient ID: Guy Taylor, male    DOB: 04-05-1971, 46 y.o.   MRN: [id number]  HPI He is here for a recheck. He has been using Levbid but finds that it does not give any relief. He does note that cold water makes his midepigastric pain worse. Other than that the pain is intermittent in nature with no associated nausea, vomiting, diarrhea, change in bowel habits. He cannot relate this to any particular foods.   Review of Systems     Objective:   Physical Exam Alert and in no distress. Abdominal exam shows active bowel sounds without masses or tenderness.       Assessment & Plan:  Midepigastric pain Have him take 40 mg of Prilosec daily for the next week and if continued difficulty, refer to GI for further evaluation.

## 2016-07-05 ENCOUNTER — Ambulatory Visit (INDEPENDENT_AMBULATORY_CARE_PROVIDER_SITE_OTHER): Payer: 59 | Admitting: Podiatry

## 2016-07-05 ENCOUNTER — Encounter: Payer: Self-pay | Admitting: Podiatry

## 2016-07-05 DIAGNOSIS — M7672 Peroneal tendinitis, left leg: Secondary | ICD-10-CM

## 2016-07-05 NOTE — Progress Notes (Signed)
He presents today for follow-up of his MRI to his left foot. He states the pain seems to have gotten worse and I felt a sharp pain in my left foot just last Friday.  Objective: Vital signs are stable alert and oriented 3. Pulses are palpable. Her logic sensorium is intact. Deep tendon reflexes are intact. Muscle strength +5 over 5 dorsiflexion plantar flexors and inverters everters all into the musculatures intact with exception of the peroneal tendons of the left foot. There is no eversion at all on the left foot there is no plantarflexion and eversion. MRI does demonstrate a rupture of the peroneus brevis complete rupture of the peroneal longus.  Assessment: Rupture of the peroneal tendons left. Diabetes mellitus well controlled.  Plan: We went over a consent form today line by line number by number giving him ample time to ask questions he saw fit regarding E peroneal tendon repair and a cast to the left lower extremity. He understands this and is amenable to it we did discuss the possible postop complications which may include but are not limited to postop pain bleeding swelling infection recurrence and need for further surgery overcorrection under correction. He understands this and is amenable to it. We discussed the need for anesthesia degrees were especially surgical center we also are requesting medical clearance and we will await that before scheduling.

## 2016-07-05 NOTE — Patient Instructions (Signed)

## 2016-07-25 ENCOUNTER — Telehealth: Payer: Self-pay | Admitting: *Deleted

## 2016-07-25 NOTE — Telephone Encounter (Signed)
"  I want to find out how to schedule a surgery with Dr. Milinda Pointer."

## 2016-07-26 ENCOUNTER — Encounter: Payer: Self-pay | Admitting: *Deleted

## 2016-07-26 NOTE — Telephone Encounter (Signed)
"  I need to reschedule my surgery."  When are you scheduled?  "I went on line and scheduled it for 08/05/2016."  I don't have you scheduled.  Dr. Milinda Pointer does not have anything available on 08/05/2016.  His next available is April 13.  "Can he do it the week after that?  Yes, he can do it on 09/09/2016.  I will get it scheduled.  Dr. Milinda Pointer wants medical clearance.  Who treats you for your diabetes?  "Dr. Redmond School is my doctor."

## 2016-08-10 ENCOUNTER — Ambulatory Visit (INDEPENDENT_AMBULATORY_CARE_PROVIDER_SITE_OTHER): Payer: 59 | Admitting: Family Medicine

## 2016-08-10 ENCOUNTER — Encounter: Payer: Self-pay | Admitting: Family Medicine

## 2016-08-10 VITALS — BP 140/80 | HR 88 | Wt >= 6400 oz

## 2016-08-10 DIAGNOSIS — N410 Acute prostatitis: Secondary | ICD-10-CM

## 2016-08-10 DIAGNOSIS — N451 Epididymitis: Secondary | ICD-10-CM

## 2016-08-10 MED ORDER — SULFAMETHOXAZOLE-TRIMETHOPRIM 800-160 MG PO TABS: 1.0000 | ORAL_TABLET | Freq: Two times a day (BID) | ORAL | 0 refills | Status: DC

## 2016-08-10 NOTE — Progress Notes (Signed)
   Subjective:    Patient ID: Guy Taylor, male    DOB: 1970/07/09, 46 y.o.   MRN: [id number]  HPI He complains of a 6 day history this started with right testicular pain with swelling but no discharge, dysuria, frequency. He is having some low abdominal and slight back discomfort. No recent sexual activity.   Review of Systems     Objective:   Physical Exam Left testes is normal. Right testes is normal however the epididymis is quite large, almost equal in size as the testes and tender to palpation. Rectal exam shows a boggy and tender prostate with referred pain into the right testes.       Assessment & Plan:  Epididymitis, right - Plan: sulfamethoxazole-trimethoprim (BACTRIM DS,SEPTRA DS) 800-160 MG tablet  Acute prostatitis I explained that this is an infection. I will place him on Septra. He is also to take 2 Aleve twice per day as well as Tylenol. If his symptoms worsen, he is to call for possible referral.

## 2016-08-10 NOTE — Patient Instructions (Signed)
2 Aleve twice a day and you can also take Tylenol if you need to kind. If this gets worse call me

## 2016-08-14 ENCOUNTER — Other Ambulatory Visit: Payer: Self-pay | Admitting: Podiatry

## 2016-08-23 ENCOUNTER — Encounter: Payer: Self-pay | Admitting: Family Medicine

## 2016-08-23 ENCOUNTER — Ambulatory Visit (INDEPENDENT_AMBULATORY_CARE_PROVIDER_SITE_OTHER): Payer: 59 | Admitting: Family Medicine

## 2016-08-23 VITALS — BP 126/88 | HR 80 | Ht 78.0 in | Wt >= 6400 oz

## 2016-08-23 DIAGNOSIS — N451 Epididymitis: Secondary | ICD-10-CM

## 2016-08-23 NOTE — Progress Notes (Signed)
   Subjective:    Patient ID: Guy Taylor, male    DOB: Nov 04, 1970, 46 y.o.   MRN: [id number]  HPI He is here for recheck. Several days after starting the antibiotic he did start to feel much better. At present time he is having no testicular discomfort.   Review of Systems     Objective:   Physical Exam Left testes and epididymis is normal. Right testicle is normal however the epididymis is slightly enlarged but nontender       Assessment & Plan:  Epididymitis, right  I will recheck again with his next diabetes visit in one month.

## 2016-08-25 ENCOUNTER — Telehealth: Payer: Self-pay | Admitting: *Deleted

## 2016-08-25 NOTE — Telephone Encounter (Signed)
"  I'm supposed to have surgery with Dr. Milinda Pointer on April 20.  I was calling for two reasons.  I haven't heard from anyone about setting up a consultation and I don't know how much my insurance will cover.  Number two, my insurance has changed.  I'll talk to someone in your office about that."  (Repair peroneal tendon - [id number])

## 2016-08-26 ENCOUNTER — Telehealth: Payer: Self-pay | Admitting: *Deleted

## 2016-08-26 NOTE — Telephone Encounter (Signed)
I'm returning your call.  "I actually got a call this morning with the information I needed.  I am clear about that.  I had an insurance change so now I'm on my wife's insurance."  I have a question.  We sent a request to Dr. Redmond School requesting medical clearance.  "I just saw him recently.  He said everything was okay for me to have surgery."  We have not received a response from him yet.  We sent the letter on March 6, maybe you can follow-up on that request.  "I will give him a call today.  I have already received authorization from [facility] for your surgery.

## 2016-08-26 NOTE — Telephone Encounter (Signed)
"  I talked to Dr. Lanice Shirts office and they want to know if you can send another letter to them and they will send whatever is necessary.  If you need me call me back."

## 2016-08-29 ENCOUNTER — Ambulatory Visit: Payer: 59 | Admitting: Family Medicine

## 2016-08-31 NOTE — Telephone Encounter (Signed)
I faxed medical clearance letter to Dr. Redmond School.  I had sent him a letter requesting clearance in EPIC via in-basket on 08/26/2016.  Still waiting for a response.

## 2016-09-07 ENCOUNTER — Telehealth: Payer: Self-pay | Admitting: *Deleted

## 2016-09-07 ENCOUNTER — Other Ambulatory Visit: Payer: Self-pay | Admitting: Podiatry

## 2016-09-07 MED ORDER — ONDANSETRON HCL 4 MG PO TABS: 4.0000 mg | ORAL_TABLET | Freq: Three times a day (TID) | ORAL | 0 refills | Status: DC | PRN

## 2016-09-07 MED ORDER — HYDROMORPHONE HCL 4 MG PO TABS: 4.0000 mg | ORAL_TABLET | ORAL | 0 refills | Status: DC | PRN

## 2016-09-07 MED ORDER — CEPHALEXIN 500 MG PO CAPS: 500.0000 mg | ORAL_CAPSULE | Freq: Three times a day (TID) | ORAL | 0 refills | Status: DC

## 2016-09-07 NOTE — Telephone Encounter (Signed)
"  I have two questions.  Am I still scheduled for surgery and did you get medical clearance from Dr. Redmond School?  Yes, you are still scheduled and we did get clearance from Dr. Redmond School.  "Okay, that is all I need to know."

## 2016-09-08 ENCOUNTER — Ambulatory Visit: Payer: 59 | Admitting: Family Medicine

## 2016-09-08 ENCOUNTER — Telehealth: Payer: Self-pay | Admitting: *Deleted

## 2016-09-08 NOTE — Telephone Encounter (Signed)
"  This is the last time I'm going to call you.  I don't know where I'm going for surgery nor what time I need to be there.  My wife has bee after me asking me, shouldn't I be up on this.  So, I am following up."  The information is in the brochure that was given to you but the address is [address].  You should hear from someone from the surgical center today about your arrival time.  "Thank you, I have the blue bag but I misplaced the brochure."

## 2016-09-09 ENCOUNTER — Encounter: Payer: Self-pay | Admitting: Podiatry

## 2016-09-09 DIAGNOSIS — M67472 Ganglion, left ankle and foot: Secondary | ICD-10-CM | POA: Diagnosis not present

## 2016-09-12 ENCOUNTER — Telehealth: Payer: Self-pay | Admitting: *Deleted

## 2016-09-12 NOTE — Telephone Encounter (Addendum)
Pt states he has an appt Thursday, but his cast feels tight and he has a sharp pain last night and the pain medication is not working. Unable to contact pt by the phone number left at time of message, line rang busy twice. Pt states the cast is getting tight since last night, doesn't feel tight all over just at the ankle, the toes and top of the cast don't look swollen. I told pt to continue the dilaudid as prescribed and continue the 818m Ibuprofen this night and discuss with Dr. HMilinda Pointertomorrow. I told pt to continue not to dangle or have his foot below his heart more than 15 minutes per hour and to ice at the back of the knee, over the ankle on the cast and even a short time on the toes to decrease swelling. I told pt I would have schedulers contact pt tomorrow early in the morning.11/01/2016-Pt states he has a clear stinky liquid coming from the bottom of the surgery site on his ankle. I spoke with A. Prevette, Dr. HStephenie Acresassistant and she states get pt in today at 1:30pm, I informed pt and he states he will be here.

## 2016-09-13 ENCOUNTER — Ambulatory Visit (INDEPENDENT_AMBULATORY_CARE_PROVIDER_SITE_OTHER): Payer: Self-pay | Admitting: Podiatry

## 2016-09-13 ENCOUNTER — Encounter: Payer: Self-pay | Admitting: Podiatry

## 2016-09-13 VITALS — BP 158/94 | HR 78 | Temp 96.4°F

## 2016-09-13 DIAGNOSIS — M7672 Peroneal tendinitis, left leg: Secondary | ICD-10-CM

## 2016-09-13 NOTE — Progress Notes (Signed)
He presents today concerns status post repair of peroneal tendon left. He states it feels like his cast is too tight and that he gets throbbing sensations. Date of surgery 09/09/2016.  Objective: Vital signs are stable he is alert and oriented 3 pressure is mildly elevated. Temperature is low. Presents with crutches nonweightbearing left cast.  Assessment: No complications.  Plan: Bivalve the cast today and will follow-up with him in a couple of weeks. Continue nonweightbearing status.

## 2016-09-15 ENCOUNTER — Other Ambulatory Visit: Payer: 59

## 2016-09-22 ENCOUNTER — Ambulatory Visit (INDEPENDENT_AMBULATORY_CARE_PROVIDER_SITE_OTHER): Payer: Self-pay | Admitting: Podiatry

## 2016-09-22 VITALS — Temp 97.1°F

## 2016-09-22 DIAGNOSIS — M7672 Peroneal tendinitis, left leg: Secondary | ICD-10-CM

## 2016-09-22 MED ORDER — HYDROMORPHONE HCL 4 MG PO TABS: 4.0000 mg | ORAL_TABLET | ORAL | 0 refills | Status: DC | PRN

## 2016-09-22 MED ORDER — PROMETHAZINE HCL 25 MG PO TABS: 25.0000 mg | ORAL_TABLET | Freq: Three times a day (TID) | ORAL | 0 refills | Status: DC | PRN

## 2016-09-22 NOTE — Progress Notes (Signed)
He presents today for cast removal he is status post peroneal tendon repair left. He denies fever chills vomiting he states he has been nauseous considerably since last week. But he has continued to take his antibiotic.  Objective: Vital signs are stable he is alert and oriented 3 cast intact and clean was removed demonstrates well-healing surgical foot staples are in good position minimal edema no erythema cellulitis drainage or odor.  Assessment well-healing surgical foot left.  Plan: Placed him in a compression dressing today placed in a short Cam Walker he's continue nonweightbearing status and follow-up with him in 1 week

## 2016-09-29 ENCOUNTER — Encounter: Payer: Self-pay | Admitting: Podiatry

## 2016-09-29 ENCOUNTER — Ambulatory Visit (INDEPENDENT_AMBULATORY_CARE_PROVIDER_SITE_OTHER): Payer: 59 | Admitting: Podiatry

## 2016-09-29 DIAGNOSIS — M7672 Peroneal tendinitis, left leg: Secondary | ICD-10-CM

## 2016-10-02 NOTE — Progress Notes (Signed)
Guy Taylor presents today for follow-up of his peroneal tendon repair date of surgery 09/09/2016 he states that is doing okay but is really been sore.  Objective: Vital signs are stable he is alert and oriented 3 presents today with his knee scooter wife and Guy Taylor he is nonweightbearing. Staples are intact margins are well coapted is good range of motion of the ankles once the staples were removed. Margins were remaining well coapted.  Assessment: Redress today drastic compressive dressing laced many Guy Walker he will continue standard Guy Walker and be nonweightbearing. I will allow him to wash the foot otherwise the Guy Taylor is on.  Plan: Follow-up with him in 2-3 weeks.

## 2016-10-11 NOTE — Progress Notes (Signed)
1. Peroneal tendon repair left foot/ankle 2. Application cast

## 2016-10-12 ENCOUNTER — Encounter: Payer: Self-pay | Admitting: Family Medicine

## 2016-10-12 ENCOUNTER — Ambulatory Visit (INDEPENDENT_AMBULATORY_CARE_PROVIDER_SITE_OTHER): Payer: 59 | Admitting: Family Medicine

## 2016-10-12 VITALS — BP 128/80 | HR 86 | Ht 78.0 in | Wt >= 6400 oz

## 2016-10-12 DIAGNOSIS — I10 Essential (primary) hypertension: Secondary | ICD-10-CM

## 2016-10-12 DIAGNOSIS — E1169 Type 2 diabetes mellitus with other specified complication: Secondary | ICD-10-CM | POA: Diagnosis not present

## 2016-10-12 DIAGNOSIS — E785 Hyperlipidemia, unspecified: Secondary | ICD-10-CM

## 2016-10-12 DIAGNOSIS — E118 Type 2 diabetes mellitus with unspecified complications: Secondary | ICD-10-CM

## 2016-10-12 DIAGNOSIS — E1159 Type 2 diabetes mellitus with other circulatory complications: Secondary | ICD-10-CM | POA: Diagnosis not present

## 2016-10-12 DIAGNOSIS — I152 Hypertension secondary to endocrine disorders: Secondary | ICD-10-CM

## 2016-10-12 LAB — POCT GLYCOSYLATED HEMOGLOBIN (HGB A1C): HEMOGLOBIN A1C: 6.4

## 2016-10-12 NOTE — Progress Notes (Signed)
  Subjective:    Patient ID: Guy Taylor, male    DOB: [birth year], 46 y.o.   MRN: [id number]  Guy Taylor is a 46 y.o. male who presents for follow-up of Type 2 diabetes mellitus.  Patient is checking home blood sugars.   Home blood sugar records: low 90 high 148 How often is blood sugars being checked: BID Current symptoms/problems None Daily foot checks: yes   Any foot concerns: None Last eye exam: 2017 Exercise: not now because of recent foot surgery approximately one month ago. He continues on Actos plus and is having no trouble with that. He is also taking Coreg, amlodipine, losartan/HCTZ for his blood pressure.  The following portions of the patient's history were reviewed and updated as appropriate: allergies, current medications, past medical history, past social history and problem list.  ROS as in subjective above.     Objective:    Physical Exam Alert and in no distress otherwise not examined.   Lab Review Diabetic Labs Latest Ref Rng & Units 04/28/2016 03/03/2016 09/25/2015 04/28/2015 12/11/2014  HbA1c - - 6.6 - 7.3 7.4(H)  Microalbumin mg/L - - - - -  Micro/Creat Ratio - - - - - -  Chol <200 mg/dL 189 - - 204(H) -  HDL >40 mg/dL 73 - - 59 -  Calc LDL <100 mg/dL 106(H) - - 131(H) -  Triglycerides <150 mg/dL 49 - - 70 -  Creatinine 0.61 - 1.24 mg/dL - - 1.56(H) - -   BP/Weight 09/13/2016 08/23/2016 08/10/2016 07/04/2016 07/02/3126  Systolic BP 118 867 737 366 815  Diastolic BP 94 88 80 78 80  Wt. (Lbs) - 422 415 414 411  BMI - 48.77 47.96 47.84 47.5   Foot/eye exam completion dates Latest Ref Rng & Units 09/12/2014  Eye Exam No Retinopathy No Retinopathy  Foot Form Completion - -  A1c is 6.4  [name]  reports that he has never smoked. He has never used smokeless tobacco. He reports that he does not drink alcohol or use drugs.     Assessment & Plan:    Type 2 diabetes mellitus with complication, without long-term current use of insulin ([place]) - Plan: HgB  A1c  Hyperlipidemia associated with type 2 diabetes mellitus ([place])  Morbid obesity due to excess calories ([place])  Hypertension associated with diabetes ([place])  2Rx changes: none 1. Education: Reviewed 'ABCs' of diabetes management (respective goals in parentheses):  A1C (<7), blood pressure (<130/80), and cholesterol (LDL <100). 2. Compliance at present is estimated to be good. Efforts to improve compliance (if necessary) will be directed at increased exercise. He will soon be able to get more physically active due to his recent surgery. 3. Follow up: 4 months I again reinforced the need for him to make further changes in his diet especially in regard to carbohydrates.

## 2016-10-20 ENCOUNTER — Encounter: Payer: Self-pay | Admitting: Podiatry

## 2016-10-20 ENCOUNTER — Ambulatory Visit (INDEPENDENT_AMBULATORY_CARE_PROVIDER_SITE_OTHER): Payer: Self-pay | Admitting: Podiatry

## 2016-10-20 DIAGNOSIS — M7672 Peroneal tendinitis, left leg: Secondary | ICD-10-CM

## 2016-10-20 NOTE — Progress Notes (Signed)
He presents today for follow-up of his peroneal is longus repair Taylor ankle and foot. Date of surgery 09/09/2016. States that he's really not getting a lot of pain from it it feels much better than it did prior surgery. Denies chest pain Shortness of breath.  Objective: Vital signs are stable alert and oriented 3. Pulses are strongly palpable. Neurologic sensorium is intact. Deep tendon reflexes are intact. He has good strength on abduction and plantar flexion and eversion.  Assessment: Guy Taylor.  Plan: We will allow him to start partial weightbearing for the next week with his boot and crutches he will then discontinue use of the crutches and continue to wear the boot for a week after that I will follow-up with him in 3 weeks at which time we had to progress to an ankle stabilizer and the pair shoes.

## 2016-10-28 ENCOUNTER — Other Ambulatory Visit: Payer: Self-pay

## 2016-10-28 ENCOUNTER — Telehealth: Payer: Self-pay

## 2016-10-28 MED ORDER — PIOGLITAZONE HCL-METFORMIN HCL 15-500 MG PO TABS: 1.0000 | ORAL_TABLET | Freq: Two times a day (BID) | ORAL | 1 refills | Status: DC

## 2016-10-28 NOTE — Telephone Encounter (Signed)
PT needs actoplus met called to [facility]. [name]

## 2016-10-28 NOTE — Telephone Encounter (Signed)
Med sent.

## 2016-11-01 ENCOUNTER — Ambulatory Visit (INDEPENDENT_AMBULATORY_CARE_PROVIDER_SITE_OTHER): Payer: 59 | Admitting: Podiatry

## 2016-11-01 ENCOUNTER — Encounter: Payer: Self-pay | Admitting: Podiatry

## 2016-11-01 DIAGNOSIS — T148XXA Other injury of unspecified body region, initial encounter: Secondary | ICD-10-CM

## 2016-11-01 MED ORDER — DOXYCYCLINE HYCLATE 100 MG PO TABS: 100.0000 mg | ORAL_TABLET | Freq: Two times a day (BID) | ORAL | 0 refills | Status: DC

## 2016-11-01 NOTE — Progress Notes (Signed)
He presents today concerned about a small area that has opened up on along his wound that had previously been healed. He is status post peroneal tendon repair and has noticed that over the past few days he has a small wound centrally located in the incision line of his left foot and ankle. He states there was some foul-smelling fluid that came out of the area. He denies fever chills nausea vomiting muscle aches or pain.  Objective: Vital signs are stable he is alert and oriented 3 no erythema edema cellulitis drainage or odor. As a small area that appears to be where the staple was removed that has very little in the way of drainage but I'm going to try to sample anyway for culture and sensitivity. I see no signs of infection but I want him to allow this to dry out and we'll start him on doxycycline anyway.  Assessment: Postop wound dehiscence left ankle.  Plan: Cannot rule out abscess or infection at this point. Started him on doxycycline and encouraged him to keep this dry and clean follow-up with him in 1 week.

## 2016-11-06 LAB — WOUND CULTURE
Gram Stain: NONE SEEN
Gram Stain: NONE SEEN

## 2016-11-07 ENCOUNTER — Telehealth: Payer: Self-pay | Admitting: *Deleted

## 2016-11-07 NOTE — Telephone Encounter (Addendum)
-----   Message from Garrel Ridgel, [place] sent at 11/06/2016 10:46 AM EDT ----- Cultures demonstrate a staph aureus infection. It appears to be sensitive to most antibiotics. We'll place him on doxycycline and I will follow-up with him at his next scheduled appointment next week. If not completely resolved and we'll consider switching him to Levaquin. 11/07/2016-I informed pt of Dr. Stephenie Acres review of cultures and reminded pt of his 11/10/2016 8:45am appt.

## 2016-11-10 ENCOUNTER — Encounter: Payer: Self-pay | Admitting: Podiatry

## 2016-11-10 ENCOUNTER — Ambulatory Visit (INDEPENDENT_AMBULATORY_CARE_PROVIDER_SITE_OTHER): Payer: Self-pay | Admitting: Podiatry

## 2016-11-10 VITALS — BP 171/103 | HR 89 | Resp 18

## 2016-11-10 DIAGNOSIS — T148XXA Other injury of unspecified body region, initial encounter: Secondary | ICD-10-CM

## 2016-11-10 DIAGNOSIS — M7672 Peroneal tendinitis, left leg: Secondary | ICD-10-CM

## 2016-11-10 MED ORDER — LEVOFLOXACIN 750 MG PO TABS: 750.0000 mg | ORAL_TABLET | Freq: Every day | ORAL | 0 refills | Status: DC

## 2016-11-10 NOTE — Progress Notes (Signed)
He presents today for follow-up of his superficial wound overlying his incision site left ankle. He states that he was really hurting yesterday now is feeling much better but the wound appears to be a little bit larger. He's been taking doxycycline forming and we did perform a culture and sensitivity.  Objective: Culture sensitivity demonstrated Staphylococcus aureus. It was sensitive to several different medications including Levaquin. The wound appears to be clean and healthy with granulation tissue and no signs of infection currently. The wound measures 1 cm in length and 3 mm in width. It does not measure depth.  Assessment: At this point number superficial wound, will small postop infection.  Plan: At this point I'm going to recommend Levaquin to be continued for another 10-14 days and also recommend dry dressing changes to the wound.  we will follow-up with him in 1 weekjust to make sure he is improving.

## 2016-11-28 ENCOUNTER — Encounter: Payer: Self-pay | Admitting: Podiatry

## 2016-11-28 ENCOUNTER — Ambulatory Visit (INDEPENDENT_AMBULATORY_CARE_PROVIDER_SITE_OTHER): Payer: Self-pay | Admitting: Podiatry

## 2016-11-28 DIAGNOSIS — Z9889 Other specified postprocedural states: Secondary | ICD-10-CM

## 2016-12-05 NOTE — Progress Notes (Signed)
   Subjective:  Patient presents today status post peroneal tendon repair of the left lower extremity by Dr. Milinda Pointer. DOS: 09/09/16. He states he is doing better and has completed the course of Levaquin. He denies any new complaints at this time.    Objective/Physical Exam Skin incisions appear to be well coapted. No sign of infectious process noted. No dehiscence. No active bleeding noted. Minimal edema noted to the surgical extremity with minimal pain on palpation.  Assessment: 1. s/p peroneal tendon repair left lower extremity by Dr. Milinda Pointer. DOS: 09/09/16.   Plan of Care:  1. Patient was evaluated. 2. No signs of infection today. 3. Continue wearing cam boot 2 weeks. 4. Return to clinic in 2 weeks with Dr. Milinda Pointer.   Edrick Kins, DPM [facility]  Dr. Edrick Kins, [address]                                        [place], [place] [address]                Office [phone]  Fax [phone]

## 2016-12-15 ENCOUNTER — Ambulatory Visit (INDEPENDENT_AMBULATORY_CARE_PROVIDER_SITE_OTHER): Payer: Self-pay | Admitting: Podiatry

## 2016-12-15 ENCOUNTER — Encounter: Payer: Self-pay | Admitting: Podiatry

## 2016-12-15 VITALS — BP 176/87 | HR 86 | Resp 16

## 2016-12-15 DIAGNOSIS — M7672 Peroneal tendinitis, left leg: Secondary | ICD-10-CM

## 2016-12-16 NOTE — Progress Notes (Signed)
He presents today for follow-up of his peroneal tendon repair date of surgery 09/09/2016. He states that it feels good absolutely no pain. I can wear flip-flops again.  Objective: Vital signs are stable he is alert and oriented 3. Pulses are palpable. No calf pain. Incision site has gone on to heal uneventfully there is no edema he has full range of motion with plantar flexion dorsiflexion inversion and eversion +5 over 5.  Assessment: Well-healing surgical peroneal tendon left.  Plan: I encouraged him to try to get back to his regular routine and to strengthen the peroneal tendons discussed exercises today and I will follow-up with him on an as-needed basis.  Roselind Messier DPM

## 2017-01-12 ENCOUNTER — Ambulatory Visit (INDEPENDENT_AMBULATORY_CARE_PROVIDER_SITE_OTHER): Payer: 59 | Admitting: Podiatry

## 2017-01-12 ENCOUNTER — Encounter: Payer: Self-pay | Admitting: Podiatry

## 2017-01-12 DIAGNOSIS — Z9889 Other specified postprocedural states: Secondary | ICD-10-CM | POA: Diagnosis not present

## 2017-01-12 DIAGNOSIS — L02619 Cutaneous abscess of unspecified foot: Secondary | ICD-10-CM

## 2017-01-12 DIAGNOSIS — M7672 Peroneal tendinitis, left leg: Secondary | ICD-10-CM | POA: Diagnosis not present

## 2017-01-12 MED ORDER — DOXYCYCLINE HYCLATE 100 MG PO TABS: 100.0000 mg | ORAL_TABLET | Freq: Two times a day (BID) | ORAL | 0 refills | Status: DC

## 2017-01-12 NOTE — Progress Notes (Signed)
He presents today for follow-up of his peroneal tendon repair left. Last time I saw him he was doing great and I have released him. At this point he presents today with a chief complaint of a painful lesion overlying his incision. He denies any drainage denies any trauma. Denies fever chills nausea, Celexa pains.  Objective: Vital signs are stable he is alert and oriented 3. Pulses are palpable. He has good range of motion of the tendon without soreness and tenderness. He does have an area that appears to be fluctuant overlying the scar itself there appears to be a small opening but there is no drainage is small scab there that is present I don't know if this was an abscess that has ruptured or so tight of injury however there is some fluctuance underneath it it very well could be a stitch abscess.  Assessment: Nondescript soft tissue lesion overlying the peroneal tendon repair left.  Plan: Started him on doxycycline I will follow-up with him in a couple weeks to see how this is doing if it has not improved then we will consider surgical excision.

## 2017-01-26 ENCOUNTER — Ambulatory Visit (INDEPENDENT_AMBULATORY_CARE_PROVIDER_SITE_OTHER): Payer: 59 | Admitting: Podiatry

## 2017-01-26 DIAGNOSIS — T148XXA Other injury of unspecified body region, initial encounter: Secondary | ICD-10-CM

## 2017-01-26 NOTE — Progress Notes (Signed)
He presents today for follow-up of his superficial ulceration and wound that he had along the peroneal tendon of his left ankle 2 weeks ago. He's been taking his antibiotics and states that the wound on his leg is healed up as well as the wound in the ankle. He denies fever chills nausea vomiting muscle aches and pains.  Objective: Evaluation of the left ankle incision site today appears that demonstrate a well-healed wound. There is still some scarring present that is mildly numb on palpation. Cannot express any type of fluid or contaminant.  Assessment: Well-healing superficial skin infection along the incision site of his peroneal tendon repair left.  Plan: I offered him an appointment for the next 2 weeks however he declined. He states he will notify us if necessary.

## 2017-03-18 IMAGING — MR MR FOOT*L* W/O CM
3 of 6 series · 9 of 40 positions shown · non-contrast
Comparison: Plain films left foot 05/12/2016.

CLINICAL DATA: Onset of lateral left foot pain when squatting 2
months ago.

EXAM:
MRI OF THE LEFT FOOT WITHOUT CONTRAST
TECHNIQUE: Multiplanar, multisequence MR imaging of the ankle was performed. No
intravenous contrast was administered.

[Series 3: PD fat-sat · axial · left · 4.0mm · 0.23mm/px · z∈[-21,+118]mm · 3 of 30 slices shown]
[im 1/30]
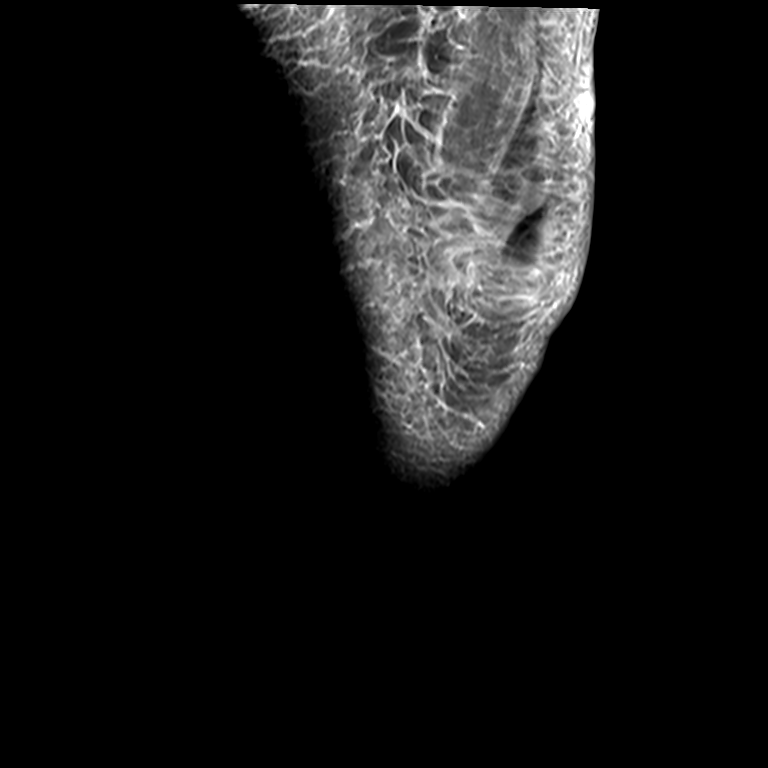
[im 15/30]
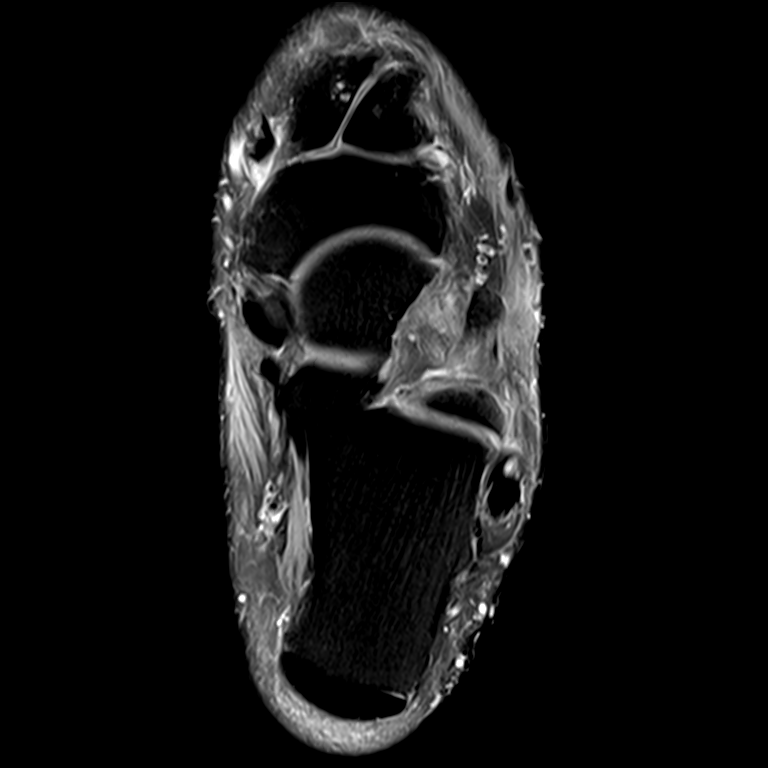
[im 30/30]
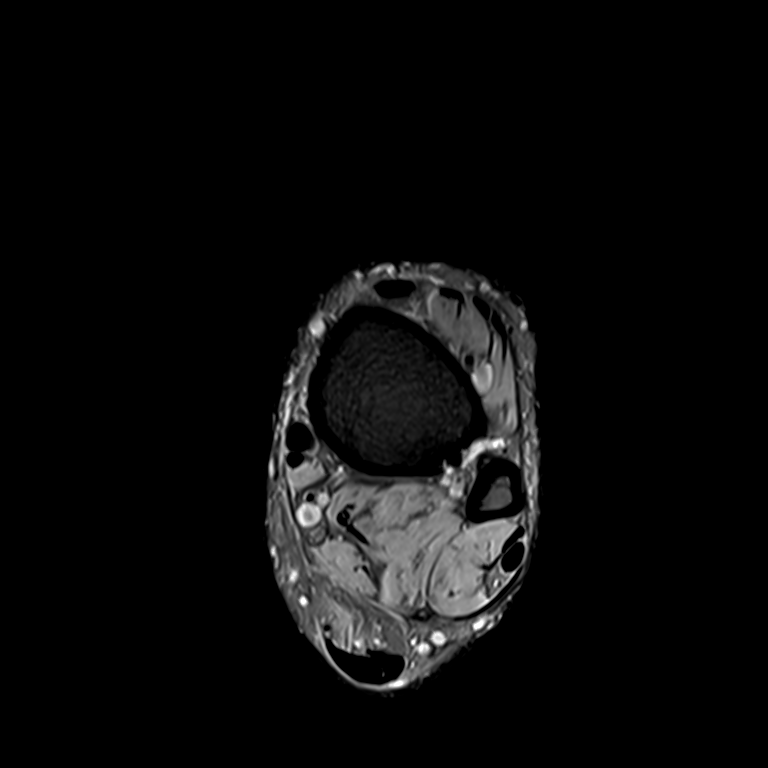

[Series 4: T2 fat-sat · axial · left · 4.0mm · 0.23mm/px · z∈[+3,+118]mm · 3 of 30 slices shown (1 of 2)]
[im 6/30]
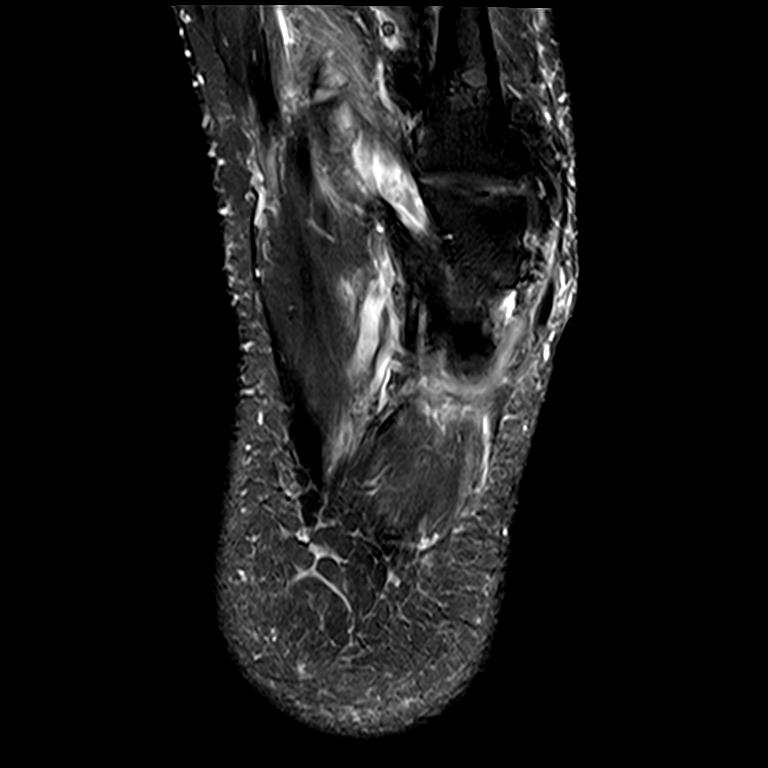
[im 18/30]
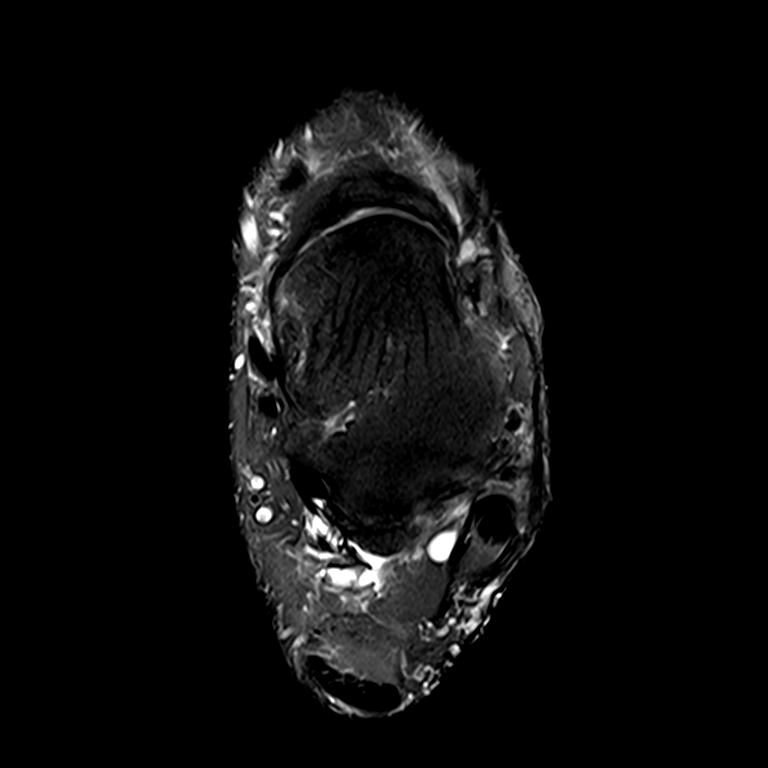
[im 30/30]
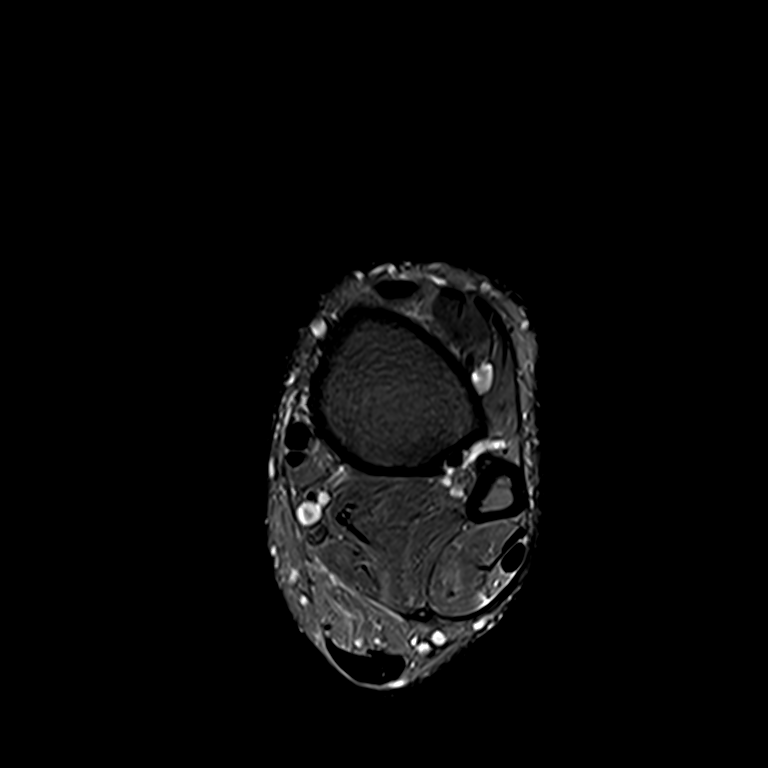

[Series 5: T2 fat-sat · sagittal · left · 2.5mm · 0.25mm/px · 3 of 35 slices shown (2 of 2)]
[im 6/35]
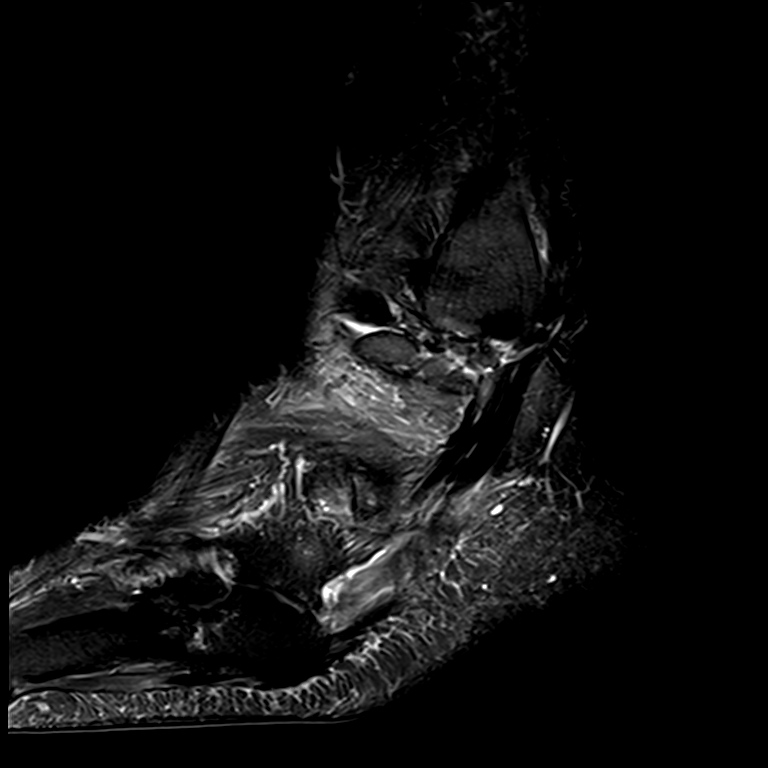
[im 18/35]
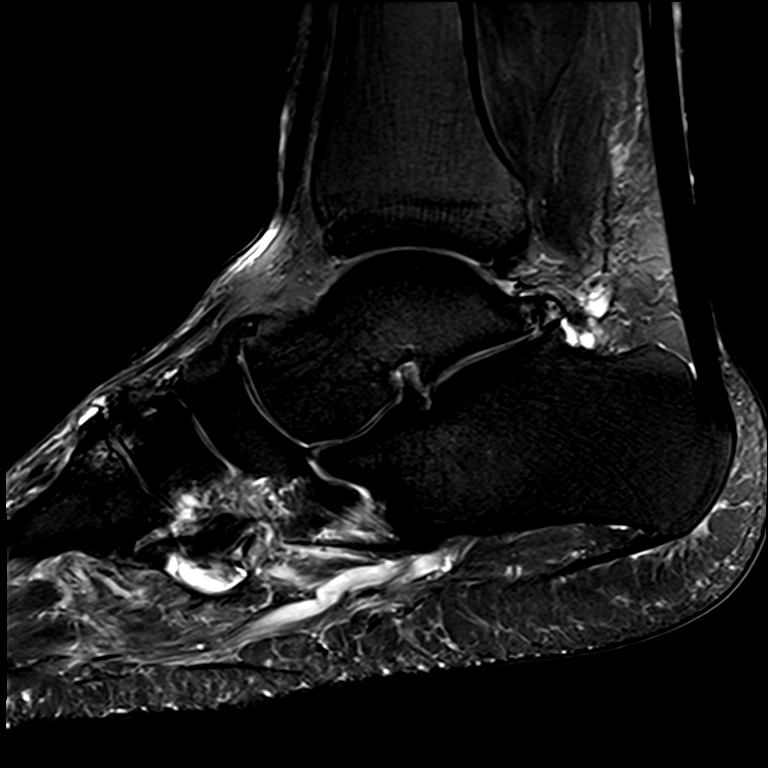
[im 29/35]
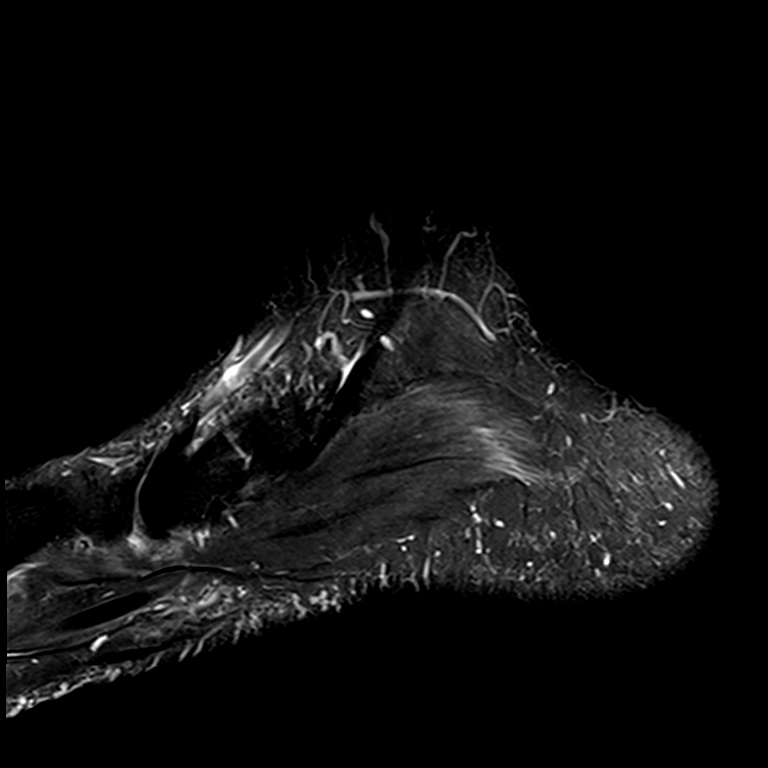

[9 of 40 positions shown; findings below may reference images not displayed]

FINDINGS: TENDONS

Peroneal: There is tendinosis of both the peroneus brevis and
longus. Longitudinal split tearing is seen in both tendons. Tear in
the peroneus longus is short segment and located just below the
lateral malleolus. Greater degree of tearing is seen in the peroneus
brevis where it extends along the calcaneus distal to the peroneal
tubercle. No full-thickness tear or retracted tendon.

Posteromedial: Intact.

Anterior: Intact.

Achilles: Intact. Minimal intrasubstance increased T2 signal is seen
in the distal Achilles.

Plantar Fascia: Normal.  Small plantar calcaneal spur is noted.

LIGAMENTS

Lateral: Intact.

Medial: Intact.

CARTILAGE

Ankle Joint: Negative.

Subtalar Joints/Sinus Tarsi: Appear normal.

Bones: No fracture or worrisome lesion. There is some midfoot
osteoarthritis most notable about the second tarsometatarsal joint
where there are subchondral cysts and edema.

Other: None.
IMPRESSION: Tendinosis and longitudinal split tearing of both the peroneus
brevis and longus, more extensive in the peroneus brevis. No
full-thickness tear or retracted tendon.

Moderate osteoarthritis second tarsometatarsal joint.

## 2017-05-01 ENCOUNTER — Other Ambulatory Visit: Payer: Self-pay | Admitting: Family Medicine

## 2017-05-09 ENCOUNTER — Other Ambulatory Visit: Payer: Self-pay | Admitting: Family Medicine

## 2017-05-09 MED ORDER — LOSARTAN POTASSIUM-HCTZ 100-12.5 MG PO TABS: 1.0000 | ORAL_TABLET | Freq: Every day | ORAL | 1 refills | Status: DC

## 2017-05-09 MED ORDER — AMLODIPINE BESYLATE 10 MG PO TABS: 10.0000 mg | ORAL_TABLET | Freq: Every day | ORAL | 1 refills | Status: DC

## 2017-05-09 MED ORDER — CARVEDILOL 12.5 MG PO TABS: ORAL_TABLET | ORAL | 2 refills | Status: DC

## 2017-05-18 ENCOUNTER — Ambulatory Visit (INDEPENDENT_AMBULATORY_CARE_PROVIDER_SITE_OTHER): Payer: 59 | Admitting: Family Medicine

## 2017-05-18 ENCOUNTER — Encounter: Payer: Self-pay | Admitting: Family Medicine

## 2017-05-18 VITALS — BP 140/80 | HR 73 | Resp 16 | Ht 78.0 in | Wt >= 6400 oz

## 2017-05-18 DIAGNOSIS — I10 Essential (primary) hypertension: Secondary | ICD-10-CM

## 2017-05-18 DIAGNOSIS — E1169 Type 2 diabetes mellitus with other specified complication: Secondary | ICD-10-CM

## 2017-05-18 DIAGNOSIS — E1159 Type 2 diabetes mellitus with other circulatory complications: Secondary | ICD-10-CM | POA: Diagnosis not present

## 2017-05-18 DIAGNOSIS — I152 Hypertension secondary to endocrine disorders: Secondary | ICD-10-CM

## 2017-05-18 DIAGNOSIS — E118 Type 2 diabetes mellitus with unspecified complications: Secondary | ICD-10-CM

## 2017-05-18 DIAGNOSIS — E785 Hyperlipidemia, unspecified: Secondary | ICD-10-CM

## 2017-05-18 LAB — CBC WITH DIFFERENTIAL/PLATELET
Basophils Absolute: 31 cells/uL (ref 0–200)
Basophils Relative: 0.6 %
Eosinophils Absolute: 338 cells/uL (ref 15–500)
Eosinophils Relative: 6.5 %
HEMATOCRIT: 43.2 % (ref 38.5–50.0)
HEMOGLOBIN: 14.2 g/dL (ref 13.2–17.1)
LYMPHS ABS: 1622 {cells}/uL (ref 850–3900)
MCH: 27.2 pg (ref 27.0–33.0)
MCHC: 32.9 g/dL (ref 32.0–36.0)
MCV: 82.6 fL (ref 80.0–100.0)
MPV: 10.1 fL (ref 7.5–12.5)
Monocytes Relative: 6.7 %
NEUTROS PCT: 55 %
Neutro Abs: 2860 cells/uL (ref 1500–7800)
Platelets: 284 10*3/uL (ref 140–400)
RBC: 5.23 10*6/uL (ref 4.20–5.80)
RDW: 13.4 % (ref 11.0–15.0)
Total Lymphocyte: 31.2 %
WBC: 5.2 10*3/uL (ref 3.8–10.8)
WBCMIX: 348 {cells}/uL (ref 200–950)

## 2017-05-18 LAB — COMPREHENSIVE METABOLIC PANEL
AG RATIO: 1.8 (calc) (ref 1.0–2.5)
ALBUMIN MSPROF: 4.6 g/dL (ref 3.6–5.1)
ALKALINE PHOSPHATASE (APISO): 69 U/L (ref 40–115)
ALT: 9 U/L (ref 9–46)
AST: 17 U/L (ref 10–40)
BILIRUBIN TOTAL: 0.6 mg/dL (ref 0.2–1.2)
BUN: 18 mg/dL (ref 7–25)
CALCIUM: 10 mg/dL (ref 8.6–10.3)
CHLORIDE: 100 mmol/L (ref 98–110)
CO2: 29 mmol/L (ref 20–32)
Creat: 1.18 mg/dL (ref 0.60–1.35)
Globulin: 2.6 g/dL (calc) (ref 1.9–3.7)
Glucose, Bld: 105 mg/dL — ABNORMAL HIGH (ref 65–99)
POTASSIUM: 4.5 mmol/L (ref 3.5–5.3)
Sodium: 138 mmol/L (ref 135–146)
Total Protein: 7.2 g/dL (ref 6.1–8.1)

## 2017-05-18 LAB — LIPID PANEL
CHOLESTEROL: 225 mg/dL — AB (ref <200)
HDL: 69 mg/dL (ref >40)
LDL CHOLESTEROL (CALC): 138 mg/dL — AB
Non-HDL Cholesterol (Calc): 156 mg/dL (calc) — ABNORMAL HIGH (ref <130)
Total CHOL/HDL Ratio: 3.3 (calc) (ref <5.0)
Triglycerides: 79 mg/dL (ref <150)

## 2017-05-18 LAB — POCT UA - MICROALBUMIN
Albumin/Creatinine Ratio, Urine, POC: <5
Creatinine, POC: 100.5 mg/dL

## 2017-05-18 LAB — POCT GLYCOSYLATED HEMOGLOBIN (HGB A1C): Hemoglobin A1C: 6.4

## 2017-05-18 NOTE — Progress Notes (Addendum)
Subjective:    Patient ID: Guy Taylor, male    DOB: 06-25-1970, 46 y.o.   MRN: [id number]  Guy Taylor is a 46 y.o. male who presents for follow-up of Type 2 diabetes mellitus.  Patient is checking home blood sugars.   Home blood sugar records: BGs range between 120 and 154 How often is blood sugars being checked: 1-2 times/ day  Current symptoms/problems include none and have been unchanged. Daily foot checks: yes   Any foot concerns: no- recent Last eye exam: needs one.  Exercise: lifting weights.  He has started exercising again mainly doing weights.  He is in the process of reevaluating his whole lifestyle in regard to taking better care of himself.  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  Paperwork was brought into fulfill the medical requirements.  He does continue on his blood pressure medications and having no difficulty with that.  He is also taking Actos plus.  Apparently he is not taking a statin. The following portions of the patient's history were reviewed and updated as appropriate: allergies, current medications, past medical history, past social history and problem list.  ROS as in subjective above.     Objective:    Physical Exam Alert and in no distress foot exam is normal and is in the chart.  Blood pressure 140/80, pulse 73, resp. rate 16, height 6' 6"  (1.981 m), weight (!) 430 lb 3.2 oz (195.1 kg), SpO2 98 %.  Lab Review Diabetic Labs Latest Ref Rng & Units 10/12/2016 04/28/2016 03/03/2016 09/25/2015 04/28/2015  HbA1c - 6.4 - 6.6 - 7.3  Microalbumin mg/L - - - - -  Micro/Creat Ratio - - - - - -  Chol <200 mg/dL - 189 - - 204(H)  HDL >40 mg/dL - 73 - - 59  Calc LDL <100 mg/dL - 106(H) - - 131(H)  Triglycerides <150 mg/dL - 49 - - 70  Creatinine 0.61 - 1.24 mg/dL - - - 1.56(H) -   BP/Weight 05/18/2017 12/15/2016 11/10/2016 10/12/2016 11/13/7626  Systolic BP 315 176 160 737 106  Diastolic BP 80 87 269 80 94    Wt. (Lbs) 430.2 - - 422 -  BMI 49.71 - - 48.77 -   Foot/eye exam completion dates Latest Ref Rng & Units 09/12/2014  Eye Exam No Retinopathy No Retinopathy  Foot Form Completion - -  A1c is 6.4  Guy Taylor  reports that  has never smoked. he has never used smokeless tobacco. He reports that he does not drink alcohol or use drugs.     Assessment & Plan:    Type 2 diabetes mellitus with complication, without long-term current use of insulin ([facility]) - Plan: POCT UA - Microalbumin, HgB A1c, CBC with Differential/Platelet, Comprehensive metabolic panel, Lipid panel  Hyperlipidemia associated with type 2 diabetes mellitus ([place]) - Plan: Lipid panel  Hypertension associated with diabetes ([place]) - Plan: CBC with Differential/Platelet, Comprehensive metabolic panel  Morbid obesity ([place])   1. Rx changes: none 2. Education: Reviewed 'ABCs' of diabetes management (respective goals in parentheses):  A1C (<7), blood pressure (<130/80), and cholesterol (LDL <100). 3. Compliance at present is estimated to be good. Efforts to improve compliance (if necessary) will be directed at increased exercise.  Also discussed dietary modification with him.  He now recognizes that he is using food as much for psychological reasons is anything and does plan to make changes there. 4. Follow  up: 4 months Discussed disciplinary issues with him in regard to taking on his 2 nieces.  He has a good understanding of this.  Did recommend being consistent as well as getting them involved in both rewards and discipline.  He is to return here in 4 months for complete exam I discussed blood pressure medications with him and would prefer to see how much of an improvement he makes with his diet and exercise and hopefully not need to readjust his blood pressure meds

## 2017-05-18 NOTE — Patient Instructions (Signed)
20 minutes of something physical every day

## 2017-05-19 MED ORDER — ROSUVASTATIN CALCIUM 20 MG PO TABS: 20.0000 mg | ORAL_TABLET | Freq: Every day | ORAL | 3 refills | Status: DC

## 2017-05-19 NOTE — Addendum Note (Signed)
Addended by: Denita Lung on: 05/19/2017 08:07 AM   Modules accepted: Orders

## 2017-07-04 ENCOUNTER — Encounter: Payer: 59 | Admitting: Family Medicine

## 2017-11-15 ENCOUNTER — Encounter: Payer: Self-pay | Admitting: Family Medicine

## 2017-11-15 ENCOUNTER — Ambulatory Visit (INDEPENDENT_AMBULATORY_CARE_PROVIDER_SITE_OTHER): Payer: Managed Care, Other (non HMO) | Admitting: Family Medicine

## 2017-11-15 VITALS — BP 126/80 | HR 79 | Temp 98.1°F | Ht 78.0 in | Wt 365.2 lb

## 2017-11-15 DIAGNOSIS — E118 Type 2 diabetes mellitus with unspecified complications: Secondary | ICD-10-CM | POA: Diagnosis not present

## 2017-11-15 DIAGNOSIS — I10 Essential (primary) hypertension: Secondary | ICD-10-CM

## 2017-11-15 DIAGNOSIS — E1169 Type 2 diabetes mellitus with other specified complication: Secondary | ICD-10-CM

## 2017-11-15 DIAGNOSIS — E785 Hyperlipidemia, unspecified: Secondary | ICD-10-CM

## 2017-11-15 DIAGNOSIS — E1159 Type 2 diabetes mellitus with other circulatory complications: Secondary | ICD-10-CM | POA: Diagnosis not present

## 2017-11-15 DIAGNOSIS — I152 Hypertension secondary to endocrine disorders: Secondary | ICD-10-CM

## 2017-11-15 LAB — HEMOGLOBIN A1C: HEMOGLOBIN A1C: 6 % — AB (ref 4.0–5.6)

## 2017-11-15 NOTE — Progress Notes (Signed)
  Subjective:    Patient ID: Guy Taylor, male    DOB: 11-16-1970, 47 y.o.   MRN: [id number]  Guy Taylor is a 47 y.o. male who presents for follow-up of Type 2 diabetes mellitus.  Patient is checking home blood sugars.   Home blood sugar records: meter records How often is blood sugars being checked: bid Current symptoms/problems include none and have been unchanged. Daily foot checks: yes   Any foot concerns: no Last eye exam: more than two years Exercise: gym He states that he stopped taking the Actos plus met in December.  He and his wife started on a keto diet that he is continued as well as making other dietary changes and has lost a significant amount of weight.  He continues on his Crestor, amlodipine, Coreg and losartan and having no difficulty with that. The following portions of the patient's history were reviewed and updated as appropriate: allergies, current medications, past medical history, past social history and problem list.  ROS as in subjective above.     Objective:    Physical Exam Alert and in no distress otherwise not examined.  Review of the record indicates an almost 70 pound weight loss!!!  Lab Review Diabetic Labs Latest Ref Rng & Units 05/18/2017 10/12/2016 04/28/2016 03/03/2016 09/25/2015  HbA1c - 6.4 6.4 - 6.6 -  Microalbumin mg/L <5.0 - - - -  Micro/Creat Ratio - <5.0 - - - -  Chol <200 mg/dL 225(H) - 189 - -  HDL >40 mg/dL 69 - 73 - -  Calc LDL mg/dL (calc) 138(H) - 106(H) - -  Triglycerides <150 mg/dL 79 - 49 - -  Creatinine 0.60 - 1.35 mg/dL 1.18 - - - 1.56(H)   BP/Weight 05/18/2017 12/15/2016 11/10/2016 10/12/2016 07/03/1550  Systolic BP 080 223 361 224 497  Diastolic BP 80 87 530 80 94  Wt. (Lbs) 430.2 - - 422 -  BMI 49.71 - - 48.77 -   Foot/eye exam completion dates Latest Ref Rng & Units 05/18/2017 09/12/2014  Eye Exam No Retinopathy - No Retinopathy  Foot Form Completion - Done -   A1c is 6.0 Guy Taylor  reports that he has never smoked. He  has never used smokeless tobacco. He reports that he does not drink alcohol or use drugs.     Assessment & Plan:    Type 2 diabetes mellitus with complication, without long-term current use of insulin (HCC)  Hyperlipidemia associated with type 2 diabetes mellitus ([place])  Hypertension associated with diabetes ([place])  Morbid obesity ([place])   1. Rx changes: none 2. Education: Reviewed 'ABCs' of diabetes management (respective goals in parentheses):  A1C (<7), blood pressure (<130/80), and cholesterol (LDL <100). 3. Compliance at present is estimated to be excellent. Efforts to improve compliance (if necessary) will be directed at Continue present diet and exercise regimen. 4. Follow up: 4 months I congratulated him on the work that he has done encouraged him to continue with this.  Discussed the fact that with continued weight loss I will then probably be able to cut back on some of his blood pressure medications.

## 2017-11-15 NOTE — Patient Instructions (Signed)
Get rid of your big boy clothes.  If you by anything by it too tight and drink into it

## 2018-01-05 LAB — HM DIABETES EYE EXAM

## 2018-02-26 ENCOUNTER — Encounter: Payer: Self-pay | Admitting: Family Medicine

## 2018-02-26 ENCOUNTER — Ambulatory Visit (INDEPENDENT_AMBULATORY_CARE_PROVIDER_SITE_OTHER): Payer: BLUE CROSS/BLUE SHIELD | Admitting: Family Medicine

## 2018-02-26 VITALS — BP 148/98 | HR 69 | Temp 97.5°F | Ht 77.0 in | Wt 353.0 lb

## 2018-02-26 DIAGNOSIS — I10 Essential (primary) hypertension: Secondary | ICD-10-CM

## 2018-02-26 DIAGNOSIS — E1169 Type 2 diabetes mellitus with other specified complication: Secondary | ICD-10-CM | POA: Diagnosis not present

## 2018-02-26 DIAGNOSIS — E785 Hyperlipidemia, unspecified: Secondary | ICD-10-CM

## 2018-02-26 DIAGNOSIS — Z0000 Encounter for general adult medical examination without abnormal findings: Secondary | ICD-10-CM | POA: Diagnosis not present

## 2018-02-26 DIAGNOSIS — E118 Type 2 diabetes mellitus with unspecified complications: Secondary | ICD-10-CM

## 2018-02-26 DIAGNOSIS — E1159 Type 2 diabetes mellitus with other circulatory complications: Secondary | ICD-10-CM | POA: Diagnosis not present

## 2018-02-26 DIAGNOSIS — Z23 Encounter for immunization: Secondary | ICD-10-CM | POA: Diagnosis not present

## 2018-02-26 DIAGNOSIS — I152 Hypertension secondary to endocrine disorders: Secondary | ICD-10-CM

## 2018-02-26 LAB — POCT URINALYSIS DIP (PROADVANTAGE DEVICE)
BILIRUBIN UA: NEGATIVE
GLUCOSE UA: NEGATIVE mg/dL
Ketones, POC UA: NEGATIVE mg/dL
Leukocytes, UA: NEGATIVE
Nitrite, UA: NEGATIVE
Protein Ur, POC: NEGATIVE mg/dL
RBC UA: NEGATIVE
SPECIFIC GRAVITY, URINE: 1.015
Urobilinogen, Ur: 3.5
pH, UA: 6.5 (ref 5.0–8.0)

## 2018-02-26 LAB — POCT GLYCOSYLATED HEMOGLOBIN (HGB A1C): Hemoglobin A1C: 6.1 % — AB (ref 4.0–5.6)

## 2018-02-26 MED ORDER — CARVEDILOL 12.5 MG PO TABS: ORAL_TABLET | ORAL | 3 refills | Status: DC

## 2018-02-26 MED ORDER — LOSARTAN POTASSIUM-HCTZ 100-12.5 MG PO TABS: 1.0000 | ORAL_TABLET | Freq: Every day | ORAL | 3 refills | Status: DC

## 2018-02-26 MED ORDER — AMLODIPINE BESYLATE 10 MG PO TABS: 10.0000 mg | ORAL_TABLET | Freq: Every day | ORAL | 3 refills | Status: DC

## 2018-02-26 MED ORDER — ROSUVASTATIN CALCIUM 20 MG PO TABS: 20.0000 mg | ORAL_TABLET | Freq: Every day | ORAL | 3 refills | Status: DC

## 2018-02-26 NOTE — Progress Notes (Signed)
Subjective:    Patient ID: Guy Taylor, male    DOB: 1970-06-08, 47 y.o.   MRN: [id number]  HPI He is here for complete examination.  He does have an underlying history of diabetes and continues to lose weight with making diet and exercise changes.  He is lost now close to 80 pounds.  States his blood sugars in the morning are running in the low 100s.  He has had an eye exam.  Presently he is not on a diabetes medication but is taking Crestor as well as amlodipine, Coreg and losartan / HCTZ.  He is very happy with the weight loss that he is obtained and does plan to continue on this.  Family and social history as well as health maintenance and immunizations was reviewed.   Review of Systems  All other systems reviewed and are negative.      Objective:   Physical Exam BP (!) 148/98 (BP Location: Left Arm, Patient Position: Sitting)   Pulse 69   Temp (!) 97.5 F (36.4 C)   Ht 6' 5"  (1.956 m)   Wt (!) 353 lb (160.1 kg)   SpO2 97%   BMI 41.86 kg/m   General Appearance:    Alert, cooperative, no distress, appears stated age  Head:    Normocephalic, without obvious abnormality, atraumatic  Eyes:    PERRL, conjunctiva/corneas clear, EOM's intact, fundi    benign  Ears:    Normal TM's and external ear canals  Nose:   Nares normal, mucosa normal, no drainage or sinus   tenderness  Throat:   Lips, mucosa, and tongue normal; teeth and gums normal  Neck:   Supple, no lymphadenopathy;  thyroid:  no   enlargement/tenderness/nodules; no carotid   bruit or JVD  Back:    Spine nontender, no curvature, ROM normal, no CVA     tenderness  Lungs:     Clear to auscultation bilaterally without wheezes, rales or     ronchi; respirations unlabored  Chest Wall:    No tenderness or deformity   Heart:    Regular rate and rhythm, S1 and S2 normal, no murmur, rub   or gallop     Abdomen:     Soft, non-tender, nondistended, normoactive bowel sounds,    no masses, no hepatosplenomegaly  Genitalia:    Deferred     Extremities:   No clubbing, cyanosis or edema  Pulses:   2+ and symmetric all extremities  Skin:   Skin color, texture, turgor normal, no rashes or lesions  Lymph nodes:   Cervical, supraclavicular, and axillary nodes normal  Neurologic:   CNII-XII intact, normal strength, sensation and gait; reflexes 2+ and symmetric throughout          Psych:   Normal mood, affect, hygiene and grooming.   Hemoglobin A1c is 6.1      Assessment & Plan:  Routine general medical examination at a health care facility - Plan: CBC with Differential/Platelet, Comprehensive metabolic panel, Lipid panel  Type 2 diabetes mellitus with complication, without long-term current use of insulin (HCC) - Plan: CBC with Differential/Platelet, Comprehensive metabolic panel, Lipid panel  Hyperlipidemia associated with type 2 diabetes mellitus ([place]) - Plan: rosuvastatin (CRESTOR) 20 MG tablet, Lipid panel  Hypertension associated with diabetes ([place]) - Plan: CBC with Differential/Platelet, Comprehensive metabolic panel, carvedilol (COREG) 12.5 MG tablet, amLODipine (NORVASC) 10 MG tablet, losartan-hydrochlorothiazide (HYZAAR) 100-12.5 MG tablet  Morbid obesity (HCC) - Plan: CBC with Differential/Platelet, Comprehensive metabolic panel, Lipid panel  Need for influenza vaccination - Plan: Flu Vaccine QUAD 6+ mos PF IM (Fluarix Quad PF)  Need for vaccination against Streptococcus pneumoniae - Plan: Pneumococcal conjugate vaccine 13-valent  I congratulated him on his weight loss and encouraged him to continue with his present diet and exercise regimen.  Updated.

## 2018-02-26 NOTE — Addendum Note (Signed)
Addended by: Elyse Jarvis on: 02/26/2018 12:19 PM   Modules accepted: Orders

## 2018-02-26 NOTE — Patient Instructions (Signed)
Occasionally check your blood sugar 2 hours after meal to give the immediate feedback on what is doing

## 2018-02-26 NOTE — Addendum Note (Signed)
Addended by: Elyse Jarvis on: 02/26/2018 12:32 PM   Modules accepted: Orders

## 2018-02-27 LAB — CBC WITH DIFFERENTIAL/PLATELET
BASOS ABS: 0 10*3/uL (ref 0.0–0.2)
Basos: 1 %
EOS (ABSOLUTE): 0.7 10*3/uL — AB (ref 0.0–0.4)
Eos: 14 %
HEMOGLOBIN: 14.4 g/dL (ref 13.0–17.7)
Hematocrit: 44.9 % (ref 37.5–51.0)
IMMATURE GRANS (ABS): 0 10*3/uL (ref 0.0–0.1)
Immature Granulocytes: 0 %
LYMPHS: 29 %
Lymphocytes Absolute: 1.4 10*3/uL (ref 0.7–3.1)
MCH: 26.5 pg — ABNORMAL LOW (ref 26.6–33.0)
MCHC: 32.1 g/dL (ref 31.5–35.7)
MCV: 83 fL (ref 79–97)
MONOCYTES: 8 %
Monocytes Absolute: 0.4 10*3/uL (ref 0.1–0.9)
Neutrophils Absolute: 2.2 10*3/uL (ref 1.4–7.0)
Neutrophils: 48 %
PLATELETS: 267 10*3/uL (ref 150–450)
RBC: 5.43 x10E6/uL (ref 4.14–5.80)
RDW: 13.7 % (ref 12.3–15.4)
WBC: 4.7 10*3/uL (ref 3.4–10.8)

## 2018-02-27 LAB — COMPREHENSIVE METABOLIC PANEL
ALBUMIN: 4.2 g/dL (ref 3.5–5.5)
ALK PHOS: 88 IU/L (ref 39–117)
ALT: 12 IU/L (ref 0–44)
AST: 18 IU/L (ref 0–40)
Albumin/Globulin Ratio: 1.6 (ref 1.2–2.2)
BILIRUBIN TOTAL: 0.3 mg/dL (ref 0.0–1.2)
BUN / CREAT RATIO: 18 (ref 9–20)
BUN: 17 mg/dL (ref 6–24)
CHLORIDE: 103 mmol/L (ref 96–106)
CO2: 23 mmol/L (ref 20–29)
CREATININE: 0.97 mg/dL (ref 0.76–1.27)
Calcium: 9.6 mg/dL (ref 8.7–10.2)
GFR calc Af Amer: 108 mL/min/{1.73_m2} (ref >59)
GFR calc non Af Amer: 93 mL/min/{1.73_m2} (ref >59)
GLUCOSE: 131 mg/dL — AB (ref 65–99)
Globulin, Total: 2.7 g/dL (ref 1.5–4.5)
Potassium: 4.4 mmol/L (ref 3.5–5.2)
Sodium: 138 mmol/L (ref 134–144)
TOTAL PROTEIN: 6.9 g/dL (ref 6.0–8.5)

## 2018-02-27 LAB — LIPID PANEL
CHOLESTEROL TOTAL: 181 mg/dL (ref 100–199)
Chol/HDL Ratio: 2.5 ratio (ref 0.0–5.0)
HDL: 73 mg/dL (ref >39)
LDL CALC: 97 mg/dL (ref 0–99)
Triglycerides: 56 mg/dL (ref 0–149)
VLDL Cholesterol Cal: 11 mg/dL (ref 5–40)

## 2018-03-04 ENCOUNTER — Ambulatory Visit (HOSPITAL_COMMUNITY)
Admission: EM | Admit: 2018-03-04 | Discharge: 2018-03-04 | Disposition: A | Discharge status: Home / Self Care | Payer: BLUE CROSS/BLUE SHIELD | Attending: Family Medicine | Admitting: Family Medicine

## 2018-03-04 ENCOUNTER — Encounter (HOSPITAL_COMMUNITY): Payer: Self-pay

## 2018-03-04 DIAGNOSIS — M545 Low back pain, unspecified: Secondary | ICD-10-CM

## 2018-03-04 DIAGNOSIS — M62838 Other muscle spasm: Secondary | ICD-10-CM

## 2018-03-04 MED ORDER — NAPROXEN 500 MG PO TABS: 500.0000 mg | ORAL_TABLET | Freq: Two times a day (BID) | ORAL | 0 refills | Status: DC

## 2018-03-04 MED ORDER — CYCLOBENZAPRINE HCL 10 MG PO TABS: 10.0000 mg | ORAL_TABLET | Freq: Every day | ORAL | 0 refills | Status: DC

## 2018-03-04 NOTE — ED Provider Notes (Signed)
[facility]   [id number] 03/04/18 Arrival Time: 4401  CC: Back pain  SUBJECTIVE: History from: patient. Guy Taylor is a 47 y.o. male complains of left low back pain that began 4 days ago.  Denies a precipitating event or specific injury.  Localizes the pain to the left low back.  Describes the pain as constant and dull in character.  Has tried OTC medications without relief.  Symptoms are made worse with bending forward.  Denies similar symptoms in the past.  Denies fever, chills, erythema, ecchymosis, effusion, saddle paresthesias, weakness, numbness and tingling, or bowel or bladder incontinence.    ROS: As per HPI.  Past Medical History:  Diagnosis Date  . Allergic rhinitis   . Asthma   . Diabetes mellitus   . Hypertension   . Hypogonadism male   . Meniscus tear    rt knee  . Obesity   . Renal disorder   . Stones in the urinary tract    Past Surgical History:  Procedure Laterality Date  . ESOPHAGOGASTRODUODENOSCOPY N/A 11/16/2014   Procedure: ESOPHAGOGASTRODUODENOSCOPY (EGD);  Surgeon: Laurence Spates, MD;  Location: [facility] ENDOSCOPY;  Service: Endoscopy;  Laterality: N/A;  . KNEE ARTHROSCOPY  02/13/2012   Procedure: ARTHROSCOPY KNEE;  Surgeon: Alta Corning, MD;  Location: [facility];  Service: Orthopedics;  Laterality: Right;   Allergies  Allergen Reactions  . Lisinopril Shortness Of Breath and Swelling   No current facility-administered medications on file prior to encounter.    Current Outpatient Medications on File Prior to Encounter  Medication Sig Dispense Refill  . Alcohol Swabs (ALCOHOL PREP) PADS 1 each by Does not apply route 2 (two) times daily.    Marland Kitchen amLODipine (NORVASC) 10 MG tablet Take 1 tablet (10 mg total) by mouth daily. 90 tablet 3  . carvedilol (COREG) 12.5 MG tablet TAKE 1 TABLET BY MOUTH TWICE DAILY WITH A MEAL 180 tablet 3  . glucose blood test strip Test twice a day. Pt uses a one touch ultra meter 100 each 6  . losartan-hydrochlorothiazide  (HYZAAR) 100-12.5 MG tablet Take 1 tablet by mouth daily. 90 tablet 3  . rosuvastatin (CRESTOR) 20 MG tablet Take 1 tablet (20 mg total) by mouth daily. 90 tablet 3  . TRUEPLUS LANCETS 30G MISC USE AS DIRECTED TO TEST TWICE DAILY 100 each 3  . VIAGRA 100 MG tablet Reported on 08/12/2015  11   Social History   Socioeconomic History  . Marital status: Married    Spouse name: Not on file  . Number of children: Not on file  . Years of education: Not on file  . Highest education level: Not on file  Occupational History  . Not on file  Social Needs  . Financial resource strain: Not on file  . Food insecurity:    Worry: Not on file    Inability: Not on file  . Transportation needs:    Medical: Not on file    Non-medical: Not on file  Tobacco Use  . Smoking status: Never Smoker  . Smokeless tobacco: Never Used  Substance and Sexual Activity  . Alcohol use: No  . Drug use: No  . Sexual activity: Yes  Lifestyle  . Physical activity:    Days per week: Not on file    Minutes per session: Not on file  . Stress: Not on file  Relationships  . Social connections:    Talks on phone: Not on file    Gets together: Not on file  Attends religious service: Not on file    Active member of club or organization: Not on file    Attends meetings of clubs or organizations: Not on file    Relationship status: Not on file  . Intimate partner violence:    Fear of current or ex partner: Not on file    Emotionally abused: Not on file    Physically abused: Not on file    Forced sexual activity: Not on file  Other Topics Concern  . Not on file  Social History Narrative   Works for [employer] and [employer], Scientist, product/process development.   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   Family History  Problem Relation Age of Onset  . Diabetes Mother     OBJECTIVE:  Vitals:   03/04/18 1026  BP: (!) 156/87  Pulse: 83  Resp: 20  Temp: 98 F (36.7 C)  TempSrc: Oral  SpO2: 96%    General appearance: AOx3; in  no acute distress; appears uncomfortable Head: NCAT Lungs: CTA bilaterally Heart: RRR.  Clear S1 and S2 without murmur, gallops, or rubs.  Radial pulses 2+ bilaterally. Musculoskeletal: Back Inspection: Skin warm, dry, clear and intact without obvious erythema, effusion, or ecchymosis.  Palpation: Tender to palpation over the left lower paravertebral muscles, no midline tenderness ROM: FROM active Strength: 5/5 shld abduction, 5/5 shld adduction, 5/5 elbow flexion, 5/5 elbow extension, 5/5 grip strength, 5/5 hip flexion, 5/5 knee abduction, 5/5 knee adduction, 5/5 knee flexion, 5/5 knee extension, 5/5 dorsiflexion Skin: warm and dry Neurologic: Ambulates without difficulty; Sensation intact about the upper/ lower extremities Psychological: alert and cooperative; normal mood and affect  ASSESSMENT & PLAN:  1. Acute left-sided low back pain without sciatica   2. Muscle spasm     Meds ordered this encounter  Medications  . naproxen (NAPROSYN) 500 MG tablet    Sig: Take 1 tablet (500 mg total) by mouth 2 (two) times daily.    Dispense:  20 tablet    Refill:  0    Order Specific Question:   Supervising Provider    Answer:   SENAI, KINGSLEY [phone]  . cyclobenzaprine (FLEXERIL) 10 MG tablet    Sig: Take 1 tablet (10 mg total) by mouth at bedtime.    Dispense:  15 tablet    Refill:  0    Order Specific Question:   Supervising Provider    Answer:   TYREN, DUGAR [[id number]]    Continue conservative management of rest, ice, heat, and gentle stretches Take naproxen as needed for pain relief (may cause abdominal discomfort, ulcers, and GI bleeds avoid taking with other NSAIDs) Take cyclobenzaprine at nighttime for symptomatic relief. Avoid driving or operating heavy machinery while using medication. Return, or follow up with PCP if symptoms persist Return or go to the ER if you have any new or worsening symptoms (fever, chills, chest pain, abdominal pain, changes in bowel or  bladder habits, pain radiating into lower legs, etc...)   Reviewed expectations re: course of current medical issues. Questions answered. Outlined signs and symptoms indicating need for more acute intervention. Patient verbalized understanding. After Visit Summary given.    Lestine Box, PA-C 03/04/18 1106

## 2018-03-04 NOTE — ED Triage Notes (Signed)
Pt presents with ongoing back pain

## 2018-03-04 NOTE — Discharge Instructions (Signed)
Continue conservative management of rest, ice, heat, and gentle stretches Take naproxen as needed for pain relief (may cause abdominal discomfort, ulcers, and GI bleeds avoid taking with other NSAIDs) Take cyclobenzaprine at nighttime for symptomatic relief. Avoid driving or operating heavy machinery while using medication. Return, or follow up with PCP if symptoms persist Return or go to the ER if you have any new or worsening symptoms (fever, chills, chest pain, abdominal pain, changes in bowel or bladder habits, pain radiating into lower legs, etc...)

## 2018-03-13 ENCOUNTER — Encounter: Payer: Self-pay | Admitting: Family Medicine

## 2018-03-13 ENCOUNTER — Ambulatory Visit
Admission: RE | Admit: 2018-03-13 | Discharge: 2018-03-13 | Disposition: A | Discharge status: Home / Self Care | Payer: BLUE CROSS/BLUE SHIELD | Source: Ambulatory Visit | Attending: Family Medicine | Admitting: Family Medicine

## 2018-03-13 ENCOUNTER — Ambulatory Visit (INDEPENDENT_AMBULATORY_CARE_PROVIDER_SITE_OTHER): Payer: BLUE CROSS/BLUE SHIELD | Admitting: Family Medicine

## 2018-03-13 VITALS — BP 130/82 | HR 64 | Temp 98.1°F | Wt 356.0 lb

## 2018-03-13 DIAGNOSIS — R109 Unspecified abdominal pain: Secondary | ICD-10-CM

## 2018-03-13 DIAGNOSIS — M25552 Pain in left hip: Secondary | ICD-10-CM | POA: Diagnosis not present

## 2018-03-13 DIAGNOSIS — M25551 Pain in right hip: Secondary | ICD-10-CM | POA: Diagnosis not present

## 2018-03-13 MED ORDER — TRAMADOL HCL 50 MG PO TABS: 50.0000 mg | ORAL_TABLET | Freq: Three times a day (TID) | ORAL | 0 refills | Status: DC | PRN

## 2018-03-13 NOTE — Progress Notes (Signed)
   Subjective:    Patient ID: Guy Taylor, male    DOB: 08-16-1970, 47 y.o.   MRN: [id number]  HPI He complains of a 10-day history of left sided low back and pelvic rim pain.  He was seen in the emergency room.  That ER record was reviewed.  He was given Naprosyn and a muscle relaxer.  No history of overuse.  He does state that the pain does radiate down to his knee.  He has difficulty if he sits for long period of time.  He states that standing makes it less bothersome.  Review of Systems     Objective:   Physical Exam Tender to palpation in the left iliac crest area.  No tenderness in the SI joint or over the lumbar or sacral spine.  Good hip motion.  Negative straight leg raising.  Too painful to do muscle testing.       Assessment & Plan:  Left flank pain - Plan: DG HIPS BILAT WITH PELVIS 3-4 VIEWS

## 2018-03-17 ENCOUNTER — Other Ambulatory Visit: Payer: Self-pay

## 2018-03-17 ENCOUNTER — Ambulatory Visit (HOSPITAL_COMMUNITY)
Admission: EM | Admit: 2018-03-17 | Discharge: 2018-03-17 | Disposition: A | Discharge status: Home / Self Care | Payer: BLUE CROSS/BLUE SHIELD | Attending: Family Medicine | Admitting: Family Medicine

## 2018-03-17 ENCOUNTER — Encounter (HOSPITAL_COMMUNITY): Payer: Self-pay | Admitting: Emergency Medicine

## 2018-03-17 DIAGNOSIS — M545 Low back pain, unspecified: Secondary | ICD-10-CM

## 2018-03-17 MED ORDER — PREDNISONE 10 MG (21) PO TBPK: ORAL_TABLET | Freq: Every day | ORAL | 0 refills | Status: DC

## 2018-03-17 MED ORDER — DIAZEPAM 10 MG PO TABS: 10.0000 mg | ORAL_TABLET | Freq: Every evening | ORAL | 0 refills | Status: DC | PRN

## 2018-03-17 NOTE — ED Triage Notes (Signed)
The patient presented to the [facility] with a complaint of back pain that radiates into his left hip x 3 weeks. The patient reported that it was aggravated by walking up steps.

## 2018-03-17 NOTE — ED Provider Notes (Signed)
[facility]   [id number] 03/17/18 Arrival Time: 9024  ASSESSMENT & PLAN:  1. Left-sided low back pain without sciatica, unspecified chronicity     Meds ordered this encounter  Medications  . predniSONE (STERAPRED UNI-PAK 21 TAB) 10 MG (21) TBPK tablet    Sig: Take by mouth daily. Take as directed.    Dispense:  21 tablet    Refill:  0  . diazepam (VALIUM) 10 MG tablet    Sig: Take 1 tablet (10 mg total) by mouth at bedtime as needed.    Dispense:  7 tablet    Refill:  0   Encouraged ROM/ambulation as he tolerates. Plans f/u with PCP or an orthopaedist if not improving with above treatment. Medication sedation precautions.  Reviewed expectations re: course of current medical issues. Questions answered. Outlined signs and symptoms indicating need for more acute intervention. Patient verbalized understanding. After Visit Summary given.   SUBJECTIVE: History from: patient.  Guy Taylor is a 47 y.o. male who presents with complaint of persistent left lower side/back pain. Seen here on 10/13; note reviewed. Given muscle relaxer. Reports no help. No specific injury reported. Onset gradual, about 3 weeks ago. History of back problems: rare low back discomfort. Discomfort described as aching without radiation. Certain movements and walking up steps tend to exacerbate the described discomfort. No specific alleviating factors. Extremity sensation changes or weakness: none. Ambulatory without difficulty. Normal bowel/bladder habits: yes. No hematuria noticed. No associated abdominal pain/n/v. No CP/SOB.  Reports no fevers, IV drug use, or recent back surgeries or procedures.  ROS: As per HPI. All other systems negative.    OBJECTIVE:  Vitals:   03/17/18 1458  BP: 119/72  Pulse: 83  Resp: 20  Temp: 98 F (36.7 C)  TempSrc: Oral  SpO2: 99%    General appearance: alert; no distress Neck: supple with FROM; without midline tenderness CV: RRR without murmer Lungs:  unlabored respirations; symmetrical air entry Abdomen: soft, non-tender; non-distended Back: moderate point tenderness just above posterior iliac crest; no specific bony tenderness; FROM at hips; bruising: none; without midline tenderness Extremities: no edema; symmetrical with no gross deformities; normal ROM of bilateral lower extremities; does report pain to area described when attempting SLR Skin: warm and dry; no rash/vesicles over area of side pain Neurologic: normal gait; normal reflexes of RLE and LLE; normal sensation of RLE and LLE; normal strength of RLE and LLE Psychological: alert and cooperative; normal mood and affect   Allergies  Allergen Reactions  . Lisinopril Shortness Of Breath and Swelling    Past Medical History:  Diagnosis Date  . Allergic rhinitis   . Asthma   . Diabetes mellitus   . Hypertension   . Hypogonadism male   . Meniscus tear    rt knee  . Obesity   . Renal disorder   . Stones in the urinary tract    Social History   Socioeconomic History  . Marital status: Married    Spouse name: Not on file  . Number of children: Not on file  . Years of education: Not on file  . Highest education level: Not on file  Occupational History  . Not on file  Social Needs  . Financial resource strain: Not on file  . Food insecurity:    Worry: Not on file    Inability: Not on file  . Transportation needs:    Medical: Not on file    Non-medical: Not on file  Tobacco Use  .  Smoking status: Never Smoker  . Smokeless tobacco: Never Used  Substance and Sexual Activity  . Alcohol use: No  . Drug use: No  . Sexual activity: Yes  Lifestyle  . Physical activity:    Days per week: Not on file    Minutes per session: Not on file  . Stress: Not on file  Relationships  . Social connections:    Talks on phone: Not on file    Gets together: Not on file    Attends religious service: Not on file    Active member of club or organization: Not on file    Attends  meetings of clubs or organizations: Not on file    Relationship status: Not on file  . Intimate partner violence:    Fear of current or ex partner: Not on file    Emotionally abused: Not on file    Physically abused: Not on file    Forced sexual activity: Not on file  Other Topics Concern  . Not on file  Social History Narrative   Works for [employer] and [employer], Scientist, product/process development.   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   Family History  Problem Relation Age of Onset  . Diabetes Mother    Past Surgical History:  Procedure Laterality Date  . ESOPHAGOGASTRODUODENOSCOPY N/A 11/16/2014   Procedure: ESOPHAGOGASTRODUODENOSCOPY (EGD);  Surgeon: Laurence Spates, MD;  Location: [facility] ENDOSCOPY;  Service: Endoscopy;  Laterality: N/A;  . KNEE ARTHROSCOPY  02/13/2012   Procedure: ARTHROSCOPY KNEE;  Surgeon: Alta Corning, MD;  Location: [place];  Service: Orthopedics;  Laterality: Right;     Vanessa Kick, MD 03/19/18 1235

## 2018-03-17 NOTE — Discharge Instructions (Addendum)
Be aware, Valium may cause drowsiness. Please do not drive, operate heavy machinery or make important decisions while on this medication, it can cloud your judgement. Do not take this along with the other muscle relaxer you were previously prescribed.  If your pain is not improving, you may benefit from seeing and orthopaedist.

## 2018-03-19 ENCOUNTER — Ambulatory Visit: Payer: Managed Care, Other (non HMO) | Admitting: Family Medicine

## 2018-03-19 DIAGNOSIS — M5442 Lumbago with sciatica, left side: Secondary | ICD-10-CM | POA: Diagnosis not present

## 2018-03-19 DIAGNOSIS — M545 Low back pain: Secondary | ICD-10-CM | POA: Diagnosis not present

## 2018-06-29 ENCOUNTER — Ambulatory Visit: Payer: BLUE CROSS/BLUE SHIELD | Admitting: Family Medicine

## 2018-07-27 ENCOUNTER — Encounter: Payer: Self-pay | Admitting: Family Medicine

## 2018-07-27 ENCOUNTER — Ambulatory Visit: Payer: BLUE CROSS/BLUE SHIELD | Admitting: Family Medicine

## 2018-07-27 VITALS — BP 138/88 | HR 71 | Temp 97.7°F | Wt 365.0 lb

## 2018-07-27 DIAGNOSIS — K219 Gastro-esophageal reflux disease without esophagitis: Secondary | ICD-10-CM

## 2018-07-27 DIAGNOSIS — E1169 Type 2 diabetes mellitus with other specified complication: Secondary | ICD-10-CM

## 2018-07-27 DIAGNOSIS — Z7189 Other specified counseling: Secondary | ICD-10-CM

## 2018-07-27 DIAGNOSIS — E785 Hyperlipidemia, unspecified: Secondary | ICD-10-CM

## 2018-07-27 DIAGNOSIS — E118 Type 2 diabetes mellitus with unspecified complications: Secondary | ICD-10-CM | POA: Diagnosis not present

## 2018-07-27 DIAGNOSIS — E1159 Type 2 diabetes mellitus with other circulatory complications: Secondary | ICD-10-CM

## 2018-07-27 DIAGNOSIS — I10 Essential (primary) hypertension: Secondary | ICD-10-CM

## 2018-07-27 DIAGNOSIS — I152 Hypertension secondary to endocrine disorders: Secondary | ICD-10-CM

## 2018-07-27 NOTE — Progress Notes (Signed)
Subjective:    Patient ID: Guy Taylor, male    DOB: 30-Mar-1971, 48 y.o.   MRN: [id number]  Guy Taylor is a 48 y.o. male who presents for follow-up of Type 2 diabetes mellitus.  Patient is checking home blood sugars.   Home blood sugar records: meter record How often is blood sugars being checked: QD fasting and 2 hours post meal 110-150 Current symptoms/problems include none at this time.  Daily foot checks: yes  Any foot concerns: no Last eye exam:2019 Exercise: working and walking Apparently his son died recently of unknown causes.  This was with his first marriage and he was not as close to him as he would have liked to have been.  He still not sure as to why he died.  He seems to be handling this well but does seem to be slightly despondent because of that.  He continues on Norvasc, Coreg and losartan/HCTZ.  He continues on Crestor without problem.  He has had occasional difficulty with what he thinks is indigestion but has not tried any reflux medications.  The symptoms last for relatively short period of time with no associated shortness of breath, diaphoresis or chest pressure. The following portions of the patient's history were reviewed and updated as appropriate: allergies, current medications, past medical history, past social history and problem list.  ROS as in subjective above.     Objective:    Physical Exam Alert and in no distress otherwise not examined. A1c is 6.3 Lab Review Diabetic Labs Latest Ref Rng & Units 02/26/2018 11/15/2017 05/18/2017 10/12/2016 04/28/2016  HbA1c 4.0 - 5.6 % 6.1(A) 6.0(A) 6.4 6.4 -  Microalbumin mg/L - - <5.0 - -  Micro/Creat Ratio - - - <5.0 - -  Chol 100 - 199 mg/dL 181 - 225(H) - 189  HDL >39 mg/dL 73 - 69 - 73  Calc LDL 0 - 99 mg/dL 97 - 138(H) - 106(H)  Triglycerides 0 - 149 mg/dL 56 - 79 - 49  Creatinine 0.76 - 1.27 mg/dL 0.97 - 1.18 - -   BP/Weight 03/17/2018 03/13/2018 03/04/2018 02/26/2018 09/23/5463  Systolic BP 681 275 170  017 494  Diastolic BP 72 82 87 98 80  Wt. (Lbs) - 356 - 353 365.2  BMI - 42.22 - 41.86 42.2   Foot/eye exam completion dates Latest Ref Rng & Units 01/05/2018 05/18/2017  Eye Exam No Retinopathy No Retinopathy -  Foot Form Completion - - Done    Guy Taylor  reports that he has never smoked. He has never used smokeless tobacco. He reports that he does not drink alcohol or use drugs.     Assessment & Plan:    Type 2 diabetes mellitus with complication, without long-term current use of insulin (HCC)  Hyperlipidemia associated with type 2 diabetes mellitus ([place])  Hypertension associated with diabetes ([place])  Morbid obesity ([place])  Gastroesophageal reflux disease, esophagitis presence not specified  Bereavement counseling   1. Rx changes: none 2. Education: Reviewed 'ABCs' of diabetes management (respective goals in parentheses):  A1C (<7), blood pressure (<130/80), and cholesterol (LDL <100). 3. Compliance at present is estimated to be fair. Efforts to improve compliance (if necessary) will be directed at increased exercise. 4. Follow up: 4 months I discussed the death of his son with him.  Encouraged him to get involved in bereavement counseling through hospice. Also encouraged him to get back on his regular diet and exercise regimen.  Explained that this will be easier after he goes through  bereavement counseling.  He was comfortable with that.  He will continue on his other medications. Recommend trying liquid Maalox or Mylanta the next time he has indigestion symptoms.  If he has pressure-like chest pain with weakness and shortness of breath he was instructed to go to the hospital.

## 2018-07-27 NOTE — Patient Instructions (Signed)
Hospice [phone]

## 2018-09-04 ENCOUNTER — Telehealth: Payer: Self-pay | Admitting: Family Medicine

## 2018-09-04 NOTE — Telephone Encounter (Signed)
Called in [facility]

## 2018-09-04 NOTE — Telephone Encounter (Signed)
Pharmacy is states that RX losartan/hctz is on back order, wants to know if this can be changed or split into 2 different pills pt uses [facility] [place] [facility] [place]

## 2018-09-04 NOTE — Telephone Encounter (Signed)
Split it up

## 2018-11-29 ENCOUNTER — Ambulatory Visit: Payer: BLUE CROSS/BLUE SHIELD | Admitting: Family Medicine

## 2018-11-29 ENCOUNTER — Encounter: Payer: Self-pay | Admitting: Family Medicine

## 2018-11-29 ENCOUNTER — Other Ambulatory Visit: Payer: Self-pay

## 2018-11-29 VITALS — BP 124/82 | HR 77 | Temp 98.1°F | Wt 378.0 lb

## 2018-11-29 DIAGNOSIS — I10 Essential (primary) hypertension: Secondary | ICD-10-CM

## 2018-11-29 DIAGNOSIS — E118 Type 2 diabetes mellitus with unspecified complications: Secondary | ICD-10-CM | POA: Diagnosis not present

## 2018-11-29 DIAGNOSIS — E785 Hyperlipidemia, unspecified: Secondary | ICD-10-CM

## 2018-11-29 DIAGNOSIS — I152 Hypertension secondary to endocrine disorders: Secondary | ICD-10-CM

## 2018-11-29 DIAGNOSIS — E1159 Type 2 diabetes mellitus with other circulatory complications: Secondary | ICD-10-CM

## 2018-11-29 DIAGNOSIS — E1169 Type 2 diabetes mellitus with other specified complication: Secondary | ICD-10-CM | POA: Diagnosis not present

## 2018-11-29 LAB — POCT GLYCOSYLATED HEMOGLOBIN (HGB A1C): Hemoglobin A1C: 6.2 % — AB (ref 4.0–5.6)

## 2018-11-29 NOTE — Progress Notes (Signed)
  Subjective:    Patient ID: DEVLON Taylor, male    DOB: 1971/04/11, 48 y.o.   MRN: [id number]  Guy Taylor is a 48 y.o. male who presents for follow-up of Type 2 diabetes mellitus.  Patient is checking home blood sugars.   Home blood sugar records: meter record How often is blood sugars being checked: BID fasting and 2 hours post meal 89-151 Current symptoms/problems include none at this time . Daily foot checks: yes   Any foot concerns: none Last eye exam: summer 2019 Exercise: walking over over 10,000 steps per day.  He continues on his amlodipine, Coreg, losartan/HCTZ.  He is also taking Crestor and having no difficulty with that.  The following portions of the patient's history were reviewed and updated as appropriate: allergies, current medications, past medical history, past social history and problem list.  ROS as in subjective above.     Objective:    Physical Exam Alert and in no distress otherwise not examined.  Blood pressure 124/82, pulse 77, temperature 98.1 F (36.7 C), weight (!) 378 lb (171.5 kg), SpO2 97 %.  Lab Review Diabetic Labs Latest Ref Rng & Units 02/26/2018 11/15/2017 05/18/2017 10/12/2016 04/28/2016  HbA1c 4.0 - 5.6 % 6.1(A) 6.0(A) 6.4 6.4 -  Microalbumin mg/L - - <5.0 - -  Micro/Creat Ratio - - - <5.0 - -  Chol 100 - 199 mg/dL 181 - 225(H) - 189  HDL >39 mg/dL 73 - 69 - 73  Calc LDL 0 - 99 mg/dL 97 - 138(H) - 106(H)  Triglycerides 0 - 149 mg/dL 56 - 79 - 49  Creatinine 0.76 - 1.27 mg/dL 0.97 - 1.18 - -   BP/Weight 11/29/2018 07/27/2018 03/17/2018 03/13/2018 89/21/1941  Systolic BP 740 814 481 856 314  Diastolic BP 82 88 72 82 87  Wt. (Lbs) 378 365 - 356 -  BMI 44.82 43.28 - 42.22 -   Foot/eye exam completion dates Latest Ref Rng & Units 01/05/2018 05/18/2017  Eye Exam No Retinopathy No Retinopathy -  Foot Form Completion - - Done  Hemoglobin A1c is 6.2  Jakhai  reports that he has never smoked. He has never used smokeless tobacco. He reports that  he does not drink alcohol or use drugs.     Assessment & Plan:    Type 2 diabetes mellitus with complication, without long-term current use of insulin (HCC) - Plan: Amb Ref to Medical Weight Management, POCT glycosylated hemoglobin (Hb A1C),   Morbid obesity ([place]) - Plan: Amb Ref to Medical Weight Management,   Hypertension associated with diabetes ([place]) - Plan: Continue on present medications.  Hyperlipidemia associated with type 2 diabetes mellitus ([place]) - Plan: Continue on present medication   1. Rx changes: none 2. Education: Reviewed 'ABCs' of diabetes management (respective goals in parentheses):  A1C (<7), blood pressure (<130/80), and cholesterol (LDL <100). 3. Compliance at present is estimated to be good. Efforts to improve compliance (if necessary) will be directed at I will send him off to medical weight loss. 4. Follow up: 4 months  So far he has done a fairly good job of keeping his diabetes under control and exercising but has not had much luck with weight loss.

## 2019-02-21 ENCOUNTER — Encounter: Payer: Managed Care, Other (non HMO) | Attending: Family Medicine | Admitting: *Deleted

## 2019-02-21 ENCOUNTER — Other Ambulatory Visit: Payer: Self-pay

## 2019-02-21 DIAGNOSIS — E118 Type 2 diabetes mellitus with unspecified complications: Secondary | ICD-10-CM | POA: Diagnosis not present

## 2019-02-21 NOTE — Patient Instructions (Signed)
Plan:  Aim for 1-2 Carb Choices per meal (15-30 grams)  Aim for 0-1 Carbs per snack if hungry  Include lean protein in moderation with your meals and snacks Consider reading food labels for Total Carbohydrate of foods Continue with your activity level by walking for 60 minutes daily as tolerated Continue checking BG at alternate times per day probably 2-3 days a week is fine as long as your blood sugars remain in target ranges.

## 2019-02-21 NOTE — Progress Notes (Signed)
Diabetes Self-Management Education  Visit Type: First/Initial  Appt. Start Time: 1400 Appt. End Time: 1500  02/21/2019  Mr. Guy Taylor, identified by name and date of birth, is a 48 y.o. male with a diagnosis of Diabetes: Type 2. Patient here for continued weight loss assistance and diabetes education. He states he has lost about 80 pounds since last year but with Covid, he had stabilized if not gained a few pounds. He is now back on track and has lost another 8 pounds since his last MD appointment 2 months ago. He is eating Keto most of the time, limiting carbohydrate to about 20 grams per meal. He states he and his wife walk about an hour 6 days a week consistently. He is testing twice daily with FBG below 100 and post meal below 120 mg/dl.   ASSESSMENT  There were no vitals taken for this visit. There is no height or weight on file to calculate BMI.  Diabetes Self-Management Education - 02/21/19 1421      Visit Information   Visit Type  First/Initial      Initial Visit   Diabetes Type  Type 2    Are you currently following a meal plan?  No    Are you taking your medications as prescribed?  Not on Medications    Date Diagnosed  2008      Health Coping   How would you rate your overall health?  Fair      Psychosocial Assessment   Patient Belief/Attitude about Diabetes  Motivated to manage diabetes    Self-care barriers  None    Other persons present  Patient    Patient Concerns  Nutrition/Meal planning;Weight Control    Special Needs  None    Learning Readiness  Change in progress    How often do you need to have someone help you when you read instructions, pamphlets, or other written materials from your doctor or pharmacy?  1 - Never    What is the last grade level you completed in school?  some college      Pre-Education Assessment   Patient understands the diabetes disease and treatment process.  Demonstrates understanding / competency    Patient understands  incorporating nutritional management into lifestyle.  Needs Review    Patient undertands incorporating physical activity into lifestyle.  Demonstrates understanding / competency    Patient understands using medications safely.  Demonstrates understanding / competency    Patient understands monitoring blood glucose, interpreting and using results  Demonstrates understanding / competency    Patient understands prevention, detection, and treatment of acute complications.  Demonstrates understanding / competency    Patient understands prevention, detection, and treatment of chronic complications.  Demonstrates understanding / competency    Patient understands how to develop strategies to address psychosocial issues.  Demonstrates understanding / competency    Patient understands how to develop strategies to promote health/change behavior.  Demonstrates understanding / competency      Complications   Last HgB A1C per patient/outside source  6.2 %    How often do you check your blood sugar?  1-2 times/day    Fasting Blood glucose range (mg/dL)  70-129    Postprandial Blood glucose range (mg/dL)  70-129    Number of hypoglycemic episodes per month  0    Have you had a dilated eye exam in the past 12 months?  No    Have you had a dental exam in the past 12 months?  Yes  Are you checking your feet?  Yes    How many days per week are you checking your feet?  2      Dietary Intake   Breakfast  5 AM: 2 eggs, 4 strips bacon    Snack (morning)  occasionally extra bacon    Lunch  left overs (chicken parmesan or marsala OR burger without bun) and lots of vegetables    Snack (afternoon)  not usually  unless pork skins    Dinner  meat, lots of vegetables, very occasionally some potatoes (maybe on Friday)    Snack (evening)  no    Beverage(s)  water, coffee occasionally with sugar free syrup and heavy cream, ICE (flavored water      Exercise   Exercise Type  Light (walking / raking leaves)    How many  days per week to you exercise?  6    How many minutes per day do you exercise?  60    Total minutes per week of exercise  360      Patient Education   Previous Diabetes Education  Yes (please comment)    Disease state   Explored patient's options for treatment of their diabetes    Nutrition management   Food label reading, portion sizes and measuring food.;Carbohydrate counting;Role of diet in the treatment of diabetes and the relationship between the three main macronutrients and blood glucose level    Physical activity and exercise   Helped patient identify appropriate exercises in relation to his/her diabetes, diabetes complications and other health issue.;Role of exercise on diabetes management, blood pressure control and cardiac health.    Monitoring  Identified appropriate SMBG and/or A1C goals.      Individualized Goals (developed by patient)   Nutrition  General guidelines for healthy choices and portions discussed    Physical Activity  Exercise 3-5 times per week    Medications  Not Applicable    Monitoring   test my blood glucose as discussed      Post-Education Assessment   Patient understands the diabetes disease and treatment process.  Demonstrates understanding / competency    Patient understands incorporating nutritional management into lifestyle.  Demonstrates understanding / competency    Patient undertands incorporating physical activity into lifestyle.  Demonstrates understanding / competency    Patient understands using medications safely.  Demonstrates understanding / competency    Patient understands monitoring blood glucose, interpreting and using results  Demonstrates understanding / competency    Patient understands prevention, detection, and treatment of acute complications.  Demonstrates understanding / competency    Patient understands prevention, detection, and treatment of chronic complications.  Demonstrates understanding / competency    Patient understands how  to develop strategies to address psychosocial issues.  Demonstrates understanding / competency      Outcomes   Expected Outcomes  Demonstrated interest in learning. Expect positive outcomes    Future DMSE  PRN    Program Status  Not Completed       Individualized Plan for Diabetes Self-Management Training:   Learning Objective:  Patient will have a greater understanding of diabetes self-management. Patient education plan is to attend individual and/or group sessions per assessed needs and concerns.   Plan:   Patient Instructions  Plan:  Aim for 1-2 Carb Choices per meal (15-30 grams)  Aim for 0-1 Carbs per snack if hungry  Include lean protein in moderation with your meals and snacks Consider reading food labels for Total Carbohydrate of foods Continue with your  activity level by walking for 60 minutes daily as tolerated Continue checking BG at alternate times per day probably 2-3 days a week is fine as long as your blood sugars remain in target ranges. Excellent job!!  Expected Outcomes:  Demonstrated interest in learning. Expect positive outcomes  Education material provided: Food label handouts, A1C conversion sheet, Meal plan card and Carbohydrate counting sheet  If problems or questions, patient to contact team via:  Phone  Future DSME appointment: PRN

## 2019-03-08 ENCOUNTER — Other Ambulatory Visit: Payer: Self-pay

## 2019-03-08 ENCOUNTER — Encounter: Payer: Self-pay | Admitting: Family Medicine

## 2019-03-08 ENCOUNTER — Ambulatory Visit (INDEPENDENT_AMBULATORY_CARE_PROVIDER_SITE_OTHER): Payer: Managed Care, Other (non HMO) | Admitting: Family Medicine

## 2019-03-08 VITALS — BP 126/84 | HR 76 | Temp 97.8°F | Ht 78.0 in | Wt 369.2 lb

## 2019-03-08 DIAGNOSIS — E1169 Type 2 diabetes mellitus with other specified complication: Secondary | ICD-10-CM

## 2019-03-08 DIAGNOSIS — E118 Type 2 diabetes mellitus with unspecified complications: Secondary | ICD-10-CM

## 2019-03-08 DIAGNOSIS — Z0000 Encounter for general adult medical examination without abnormal findings: Secondary | ICD-10-CM

## 2019-03-08 DIAGNOSIS — E1159 Type 2 diabetes mellitus with other circulatory complications: Secondary | ICD-10-CM | POA: Diagnosis not present

## 2019-03-08 DIAGNOSIS — J301 Allergic rhinitis due to pollen: Secondary | ICD-10-CM

## 2019-03-08 DIAGNOSIS — Z23 Encounter for immunization: Secondary | ICD-10-CM | POA: Diagnosis not present

## 2019-03-08 DIAGNOSIS — I152 Hypertension secondary to endocrine disorders: Secondary | ICD-10-CM

## 2019-03-08 DIAGNOSIS — E291 Testicular hypofunction: Secondary | ICD-10-CM

## 2019-03-08 DIAGNOSIS — I10 Essential (primary) hypertension: Secondary | ICD-10-CM

## 2019-03-08 DIAGNOSIS — E785 Hyperlipidemia, unspecified: Secondary | ICD-10-CM

## 2019-03-08 DIAGNOSIS — N529 Male erectile dysfunction, unspecified: Secondary | ICD-10-CM

## 2019-03-08 LAB — POCT URINALYSIS DIP (PROADVANTAGE DEVICE)
Bilirubin, UA: NEGATIVE
Blood, UA: NEGATIVE
Glucose, UA: NEGATIVE mg/dL
Ketones, POC UA: NEGATIVE mg/dL
Leukocytes, UA: NEGATIVE
Nitrite, UA: NEGATIVE
Protein Ur, POC: NEGATIVE mg/dL
Specific Gravity, Urine: 1.02
Urobilinogen, Ur: 0.2
pH, UA: 6 (ref 5.0–8.0)

## 2019-03-08 LAB — POCT GLYCOSYLATED HEMOGLOBIN (HGB A1C): Hemoglobin A1C: 6.6 % — AB (ref 4.0–5.6)

## 2019-03-08 LAB — POCT UA - MICROALBUMIN
Albumin/Creatinine Ratio, Urine, POC: 23.6
Creatinine, POC: 125.4 mg/dL
Microalbumin Ur, POC: 29.6 mg/L

## 2019-03-08 MED ORDER — ROSUVASTATIN CALCIUM 20 MG PO TABS: 20.0000 mg | ORAL_TABLET | Freq: Every day | ORAL | 3 refills | Status: DC

## 2019-03-08 MED ORDER — LOSARTAN POTASSIUM-HCTZ 100-12.5 MG PO TABS: 1.0000 | ORAL_TABLET | Freq: Every day | ORAL | 3 refills | Status: DC

## 2019-03-08 MED ORDER — TADALAFIL 20 MG PO TABS: 20.0000 mg | ORAL_TABLET | Freq: Every day | ORAL | 5 refills | Status: DC | PRN

## 2019-03-08 MED ORDER — AMLODIPINE BESYLATE 10 MG PO TABS: 10.0000 mg | ORAL_TABLET | Freq: Every day | ORAL | 3 refills | Status: DC

## 2019-03-08 MED ORDER — CARVEDILOL 12.5 MG PO TABS: ORAL_TABLET | ORAL | 3 refills | Status: DC

## 2019-03-08 NOTE — Progress Notes (Signed)
Subjective:    Patient ID: Guy Taylor, male    DOB: 1971-05-03, 49 y.o.   MRN: [id number]  HPI He is here for complete examination.  He does have underlying diabetes.  He has lost a significant amount of weight over the last several years but seems to be fairly stabilized over the last year.  Continues to be fairly physically active.  He does have a history of hypogonadism and is taking testosterone for that.  His allergies seem to be under good control.  He does have underlying erectile dysfunction.  He continues on Crestor without any muscle aches or pains.  Also taking losartan, Coreg and Norvasc and having no difficulties with that.  Presently he is on no hypoglycemic agents.  He has no other concerns or complaints.  Family and social history as well as health maintenance immunizations was reviewed.   Review of Systems  All other systems reviewed and are negative.      Objective:   Physical Exam BP 126/84 (BP Location: Left Arm, Patient Position: Sitting)   Pulse 76   Temp 97.8 F (36.6 C)   Ht 6' 6"  (1.981 m)   Wt (!) 369 lb 3.2 oz (167.5 kg)   SpO2 97%   BMI 42.67 kg/m   General Appearance:    Alert, cooperative, no distress, appears stated age  Head:    Normocephalic, without obvious abnormality, atraumatic  Eyes:    PERRL, conjunctiva/corneas clear, EOM's intact,     Ears:    Normal TM's and external ear canals  Nose:   Nares normal, mucosa normal, no drainage or sinus   tenderness  Throat:   Lips, mucosa, and tongue normal; teeth and gums normal  Neck:   Supple, no lymphadenopathy;  thyroid:  no   enlargement/tenderness/nodules; no carotid   bruit or JVD  Back:    Spine nontender, no curvature, ROM normal, no CVA     tenderness  Lungs:     Clear to auscultation bilaterally without wheezes, rales or     ronchi; respirations unlabored      Heart:    Regular rate and rhythm, S1 and S2 normal, no murmur, rub   or gallop  Breast Exam:    No chest wall tenderness,  masses or gynecomastia  Abdomen:     Soft, non-tender, nondistended, normoactive bowel sounds,    no masses, no hepatosplenomegaly  Genitalia:   Deferred  Rectal:    Deferred  Extremities:   No clubbing, cyanosis or edema foot exam normal  Pulses:   2+ and symmetric all extremities  Skin:   Skin color, texture, turgor normal, no rashes or lesions  Lymph nodes:   Cervical, supraclavicular, and axillary nodes normal  Neurologic:   CNII-XII intact, normal strength, sensation and gait; reflexes 2+ and symmetric throughout          Psych:   Normal mood, affect, hygiene and grooming.   Hemoglobin A1c is 6.6      Assessment & Plan:  Routine general medical examination at health care facility - Plan: POCT Urinalysis DIP (Proadvantage Device), CBC with Differential/Platelet, Comprehensive metabolic panel, Lipid panel  Need for influenza vaccination - Plan: Flu Vaccine QUAD 36+ mos IM  Type 2 diabetes mellitus with complication, without long-term current use of insulin (HCC) - Plan: POCT glycosylated hemoglobin (Hb A1C), POCT UA - Microalbumin, Amb Ref to Medical Weight Management, CBC with Differential/Platelet, Comprehensive metabolic panel, Lipid panel, POCT UA - Microalbumin  Morbid obesity ([place]) -  Plan: Amb Ref to Medical Weight Management  Hypertension associated with diabetes ([place]) - Plan: amLODipine (NORVASC) 10 MG tablet, carvedilol (COREG) 12.5 MG tablet, losartan-hydrochlorothiazide (HYZAAR) 100-12.5 MG tablet  Hyperlipidemia associated with type 2 diabetes mellitus ([place]) - Plan: rosuvastatin (CRESTOR) 20 MG tablet  Erectile dysfunction, unspecified erectile dysfunction type - Plan: tadalafil (CIALIS) 20 MG tablet  Need for vaccination against Streptococcus pneumoniae - Plan: Pneumococcal polysaccharide vaccine 23-valent greater than or equal to 2yo subcutaneous/IM  Hypogonadism male - Plan: Testosterone  Seasonal allergic rhinitis due to pollen  He will continue on his present  medication regimen.  He is interested in being referred to the medical weight loss and wellness center which I totally agree.  He seems to be kind of stuck at the present time.  Otherwise I will see him back here in approximately 6 months.

## 2019-03-09 ENCOUNTER — Other Ambulatory Visit: Payer: Self-pay | Admitting: Family Medicine

## 2019-03-09 DIAGNOSIS — E1159 Type 2 diabetes mellitus with other circulatory complications: Secondary | ICD-10-CM

## 2019-03-09 DIAGNOSIS — I10 Essential (primary) hypertension: Secondary | ICD-10-CM

## 2019-03-09 DIAGNOSIS — I152 Hypertension secondary to endocrine disorders: Secondary | ICD-10-CM

## 2019-03-09 LAB — CBC WITH DIFFERENTIAL/PLATELET
Basophils Absolute: 0.1 10*3/uL (ref 0.0–0.2)
Basos: 1 %
EOS (ABSOLUTE): 0.5 10*3/uL — ABNORMAL HIGH (ref 0.0–0.4)
Eos: 11 %
Hematocrit: 46.3 % (ref 37.5–51.0)
Hemoglobin: 14.9 g/dL (ref 13.0–17.7)
Immature Grans (Abs): 0 10*3/uL (ref 0.0–0.1)
Immature Granulocytes: 0 %
Lymphocytes Absolute: 1.4 10*3/uL (ref 0.7–3.1)
Lymphs: 32 %
MCH: 26.2 pg — ABNORMAL LOW (ref 26.6–33.0)
MCHC: 32.2 g/dL (ref 31.5–35.7)
MCV: 82 fL (ref 79–97)
Monocytes Absolute: 0.3 10*3/uL (ref 0.1–0.9)
Monocytes: 7 %
Neutrophils Absolute: 2.1 10*3/uL (ref 1.4–7.0)
Neutrophils: 49 %
Platelets: 270 10*3/uL (ref 150–450)
RBC: 5.68 x10E6/uL (ref 4.14–5.80)
RDW: 14.1 % (ref 11.6–15.4)
WBC: 4.3 10*3/uL (ref 3.4–10.8)

## 2019-03-09 LAB — COMPREHENSIVE METABOLIC PANEL
ALT: 11 IU/L (ref 0–44)
AST: 17 IU/L (ref 0–40)
Albumin/Globulin Ratio: 1.8 (ref 1.2–2.2)
Albumin: 4.4 g/dL (ref 4.0–5.0)
Alkaline Phosphatase: 102 IU/L (ref 39–117)
BUN/Creatinine Ratio: 20 (ref 9–20)
BUN: 23 mg/dL (ref 6–24)
Bilirubin Total: 0.7 mg/dL (ref 0.0–1.2)
CO2: 24 mmol/L (ref 20–29)
Calcium: 9.5 mg/dL (ref 8.7–10.2)
Chloride: 101 mmol/L (ref 96–106)
Creatinine, Ser: 1.17 mg/dL (ref 0.76–1.27)
GFR calc Af Amer: 85 mL/min/{1.73_m2} (ref >59)
GFR calc non Af Amer: 74 mL/min/{1.73_m2} (ref >59)
Globulin, Total: 2.5 g/dL (ref 1.5–4.5)
Glucose: 157 mg/dL — ABNORMAL HIGH (ref 65–99)
Potassium: 4.5 mmol/L (ref 3.5–5.2)
Sodium: 138 mmol/L (ref 134–144)
Total Protein: 6.9 g/dL (ref 6.0–8.5)

## 2019-03-09 LAB — LIPID PANEL
Chol/HDL Ratio: 3 ratio (ref 0.0–5.0)
Cholesterol, Total: 183 mg/dL (ref 100–199)
HDL: 61 mg/dL (ref >39)
LDL Chol Calc (NIH): 112 mg/dL — ABNORMAL HIGH (ref 0–99)
Triglycerides: 53 mg/dL (ref 0–149)
VLDL Cholesterol Cal: 10 mg/dL (ref 5–40)

## 2019-03-09 LAB — TESTOSTERONE: Testosterone: 204 ng/dL — ABNORMAL LOW (ref 264–916)

## 2019-04-02 ENCOUNTER — Ambulatory Visit: Payer: BC Managed Care – PPO | Admitting: Family Medicine

## 2019-04-04 ENCOUNTER — Other Ambulatory Visit (INDEPENDENT_AMBULATORY_CARE_PROVIDER_SITE_OTHER): Payer: Managed Care, Other (non HMO)

## 2019-04-04 ENCOUNTER — Other Ambulatory Visit: Payer: Self-pay

## 2019-04-04 DIAGNOSIS — Z1211 Encounter for screening for malignant neoplasm of colon: Secondary | ICD-10-CM | POA: Diagnosis not present

## 2019-04-04 LAB — HEMOCCULT GUIAC POC 1CARD (OFFICE)
Card #2 Fecal Occult Blod, POC: NEGATIVE
Card #3 Fecal Occult Blood, POC: NEGATIVE
Fecal Occult Blood, POC: NEGATIVE

## 2019-04-15 ENCOUNTER — Ambulatory Visit (INDEPENDENT_AMBULATORY_CARE_PROVIDER_SITE_OTHER): Payer: Managed Care, Other (non HMO) | Admitting: Family Medicine

## 2019-04-15 ENCOUNTER — Other Ambulatory Visit: Payer: Self-pay

## 2019-04-15 NOTE — Progress Notes (Signed)
  Subjective:    Patient ID: Guy Taylor, male    DOB: [birth year], 48 y.o.   MRN: [id number]  Not seen

## 2019-05-20 ENCOUNTER — Other Ambulatory Visit: Payer: Self-pay | Admitting: Internal Medicine

## 2019-05-20 DIAGNOSIS — N529 Male erectile dysfunction, unspecified: Secondary | ICD-10-CM

## 2019-05-20 MED ORDER — TADALAFIL 20 MG PO TABS: 20.0000 mg | ORAL_TABLET | Freq: Every day | ORAL | 0 refills | Status: DC | PRN

## 2019-07-25 ENCOUNTER — Other Ambulatory Visit: Payer: Self-pay

## 2019-07-25 ENCOUNTER — Telehealth: Payer: Self-pay | Admitting: Family Medicine

## 2019-07-25 DIAGNOSIS — I152 Hypertension secondary to endocrine disorders: Secondary | ICD-10-CM

## 2019-07-25 DIAGNOSIS — E1159 Type 2 diabetes mellitus with other circulatory complications: Secondary | ICD-10-CM

## 2019-07-25 MED ORDER — CARVEDILOL 12.5 MG PO TABS: ORAL_TABLET | ORAL | 3 refills | Status: DC

## 2019-07-25 NOTE — Telephone Encounter (Signed)
[facility] req refill for Carvedilol 12.5 mg tab #180

## 2019-08-14 ENCOUNTER — Ambulatory Visit: Payer: Managed Care, Other (non HMO) | Admitting: Family Medicine

## 2019-08-27 ENCOUNTER — Encounter: Payer: Self-pay | Admitting: Family Medicine

## 2019-08-27 ENCOUNTER — Other Ambulatory Visit: Payer: Self-pay

## 2019-08-27 ENCOUNTER — Ambulatory Visit (INDEPENDENT_AMBULATORY_CARE_PROVIDER_SITE_OTHER): Payer: Managed Care, Other (non HMO) | Admitting: Family Medicine

## 2019-08-27 VITALS — BP 188/110 | HR 93 | Temp 98.4°F | Wt 388.6 lb

## 2019-08-27 DIAGNOSIS — I152 Hypertension secondary to endocrine disorders: Secondary | ICD-10-CM

## 2019-08-27 DIAGNOSIS — M25562 Pain in left knee: Secondary | ICD-10-CM

## 2019-08-27 DIAGNOSIS — E118 Type 2 diabetes mellitus with unspecified complications: Secondary | ICD-10-CM

## 2019-08-27 DIAGNOSIS — I10 Essential (primary) hypertension: Secondary | ICD-10-CM

## 2019-08-27 DIAGNOSIS — S29011A Strain of muscle and tendon of front wall of thorax, initial encounter: Secondary | ICD-10-CM

## 2019-08-27 DIAGNOSIS — E1169 Type 2 diabetes mellitus with other specified complication: Secondary | ICD-10-CM | POA: Diagnosis not present

## 2019-08-27 DIAGNOSIS — E1159 Type 2 diabetes mellitus with other circulatory complications: Secondary | ICD-10-CM | POA: Diagnosis not present

## 2019-08-27 DIAGNOSIS — E785 Hyperlipidemia, unspecified: Secondary | ICD-10-CM

## 2019-08-27 LAB — POCT GLYCOSYLATED HEMOGLOBIN (HGB A1C): Hemoglobin A1C: 6.6 % — AB (ref 4.0–5.6)

## 2019-08-27 NOTE — Addendum Note (Signed)
Addended by: Elyse Jarvis on: 08/27/2019 04:49 PM   Modules accepted: Orders

## 2019-08-27 NOTE — Patient Instructions (Signed)
Take 4 ibuprofen 3 times per day for the next couple of weeks and if that does not calm down then get an appointment to see me. Check out NOOM

## 2019-08-27 NOTE — Progress Notes (Signed)
Subjective:    Patient ID: Guy Taylor, male    DOB: Mar 06, 1971, 49 y.o.   MRN: [id number]  Guy Taylor is a 49 y.o. male who presents for follow-up of Type 2 diabetes mellitus. Home blood sugar records: meter record 150- 179 post meal Current symptoms/problems include none at this time. Daily foot checks:yes   Any foot concerns: none Exercise: QD  Diet: regular He is under a lot of stress and that he just sold his house and is now trying to find another place to live and having difficulty with that.  He injured his right pectoral area on Sunday when he was lifting an object.  Is now only minimally interfering with his ability to use his arms.  He does complain of a 3-week history of left knee pain but no popping, locking or grinding.  He has had previous arthroscopic surgery on that knee.  He has been using ibuprofen 2 pills twice per day without much luck.  He continues on Coreg/Norvasc/losartan for his blood pressure.  Continues on Crestor.  Presently is on no antidiabetes medicine. The following portions of the patient's history were reviewed and updated as appropriate: allergies, current medications, past medical history, past social history and problem list.  ROS as in subjective above.     Objective:    Physical Exam Alert and in no distress slight tenderness palpation in the right lateral pectoral area.  Exam of the left knee shows slight tenderness over the joint line with McMurray's testing causing some difficulty.  Negative anterior drawer.  Ligaments intact. Hemoglobin A1c is 6.6   Lab Review Diabetic Labs Latest Ref Rng & Units 03/08/2019 11/29/2018 02/26/2018 11/15/2017 05/18/2017  HbA1c 4.0 - 5.6 % 6.6(A) 6.2(A) 6.1(A) 6.0(A) 6.4  Microalbumin mg/L 29.6 - - - <5.0  Micro/Creat Ratio - 23.6 - - - <5.0  Chol 100 - 199 mg/dL 183 - 181 - 225(H)  HDL >39 mg/dL 61 - 73 - 69  Calc LDL 0 - 99 mg/dL 112(H) - 97 - 138(H)  Triglycerides 0 - 149 mg/dL 53 - 56 - 79  Creatinine  0.76 - 1.27 mg/dL 1.17 - 0.97 - 1.18   BP/Weight 03/08/2019 11/29/2018 07/27/2018 03/17/2018 87/56/4332  Systolic BP 951 884 166 063 016  Diastolic BP 84 82 88 72 82  Wt. (Lbs) 369.2 378 365 - 356  BMI 42.67 44.82 43.28 - 42.22   Foot/eye exam completion dates Latest Ref Rng & Units 03/08/2019 01/05/2018  Eye Exam No Retinopathy - No Retinopathy  Foot Form Completion - Done -    Guy Taylor  reports that he has never smoked. He has never used smokeless tobacco. He reports that he does not drink alcohol or use drugs.     Assessment & Plan:    Type 2 diabetes mellitus with complication, without long-term current use of insulin (HCC)  Morbid obesity ([place])  Hypertension associated with diabetes ([place])  Hyperlipidemia associated with type 2 diabetes mellitus ([place])  Pectoralis muscle strain, initial encounter  Left knee pain, unspecified chronicity Take 4 ibuprofen 3 times per day for the next couple of weeks and if that does not calm down then get an appointment to see me. Check out NOOM to help with your weight. No particular therapy needed for the pectoral muscle as it is getting slowly better on its own.  1. Rx changes: none 2. Education: Reviewed 'ABCs' of diabetes management (respective goals in parentheses):  A1C (<7), blood pressure (<130/80), and cholesterol (LDL <100).  3. Compliance at present is estimated to be fair. Efforts to improve compliance (if necessary) will be directed at increased exercise. 4. Follow up: 4 months    for diabetes care but come back in 1 month for blood pressure check. I agree and stressed the fact that even though his A1c looks good, further diet and exercise changes to help him with weight reduction can also help control his blood pressure as well as overall health.

## 2019-09-25 ENCOUNTER — Ambulatory Visit (INDEPENDENT_AMBULATORY_CARE_PROVIDER_SITE_OTHER): Payer: Managed Care, Other (non HMO) | Admitting: Family Medicine

## 2019-09-25 ENCOUNTER — Other Ambulatory Visit: Payer: Self-pay

## 2019-09-25 ENCOUNTER — Encounter: Payer: Self-pay | Admitting: Family Medicine

## 2019-09-25 VITALS — BP 170/100 | HR 68 | Temp 97.6°F | Wt 389.4 lb

## 2019-09-25 DIAGNOSIS — I10 Essential (primary) hypertension: Secondary | ICD-10-CM

## 2019-09-25 DIAGNOSIS — E1159 Type 2 diabetes mellitus with other circulatory complications: Secondary | ICD-10-CM

## 2019-09-25 DIAGNOSIS — I152 Hypertension secondary to endocrine disorders: Secondary | ICD-10-CM

## 2019-09-25 MED ORDER — CARVEDILOL 25 MG PO TABS: 25.0000 mg | ORAL_TABLET | Freq: Two times a day (BID) | ORAL | 3 refills | Status: DC

## 2019-09-25 MED ORDER — AMLODIPINE BESYLATE 10 MG PO TABS: 10.0000 mg | ORAL_TABLET | Freq: Every day | ORAL | 3 refills | Status: DC

## 2019-09-25 NOTE — Progress Notes (Signed)
   Subjective:    Patient ID: Guy Taylor, male    DOB: 1970/10/29, 49 y.o.   MRN: [id number]  HPI He is here for recheck on his blood pressure.  He has been taking his Coreg, Norvasc and losartan/HCTZ.  He is also considering getting involved in medical weight loss and wellness and did talk to them.  In the past he has used a keto diet which was semisuccessful but he did not stay with it.   Review of Systems     Objective:   Physical Exam Alert and in no distress.  Blood pressure is recorded       Assessment & Plan:  Hypertension associated with diabetes ([place]) - Plan: carvedilol (COREG) 25 MG tablet, amLODipine (NORVASC) 10 MG tablet  Morbid obesity ([place]) He will continue on his present medications plus increasing the Coreg.  I again discussed diet and exercise with him in regard to her blood pressure, weight, diabetes.  He will reconsider going in the medical weight loss.

## 2019-10-09 ENCOUNTER — Telehealth: Payer: Self-pay | Admitting: Family Medicine

## 2019-10-09 DIAGNOSIS — I152 Hypertension secondary to endocrine disorders: Secondary | ICD-10-CM

## 2019-10-09 DIAGNOSIS — E1159 Type 2 diabetes mellitus with other circulatory complications: Secondary | ICD-10-CM

## 2019-10-09 MED ORDER — AMLODIPINE BESYLATE 10 MG PO TABS: 10.0000 mg | ORAL_TABLET | Freq: Every day | ORAL | 3 refills | Status: DC

## 2019-10-09 MED ORDER — LOSARTAN POTASSIUM-HCTZ 100-12.5 MG PO TABS: 1.0000 | ORAL_TABLET | Freq: Every day | ORAL | 3 refills | Status: DC

## 2019-10-09 MED ORDER — CARVEDILOL 25 MG PO TABS: 25.0000 mg | ORAL_TABLET | Freq: Two times a day (BID) | ORAL | 3 refills | Status: DC

## 2019-10-09 NOTE — Telephone Encounter (Signed)
Pt needs refill on Amlodipine,Carvedilol, and Losartan-hydrochlorothiazide. [facility]

## 2019-11-12 ENCOUNTER — Ambulatory Visit: Payer: Managed Care, Other (non HMO) | Admitting: Family Medicine

## 2020-01-07 ENCOUNTER — Other Ambulatory Visit: Payer: Self-pay

## 2020-01-07 ENCOUNTER — Encounter: Payer: Self-pay | Admitting: Family Medicine

## 2020-01-07 ENCOUNTER — Ambulatory Visit: Payer: Managed Care, Other (non HMO) | Admitting: Family Medicine

## 2020-01-07 VITALS — BP 116/70 | HR 75 | Temp 97.3°F | Wt 382.2 lb

## 2020-01-07 DIAGNOSIS — E1159 Type 2 diabetes mellitus with other circulatory complications: Secondary | ICD-10-CM | POA: Diagnosis not present

## 2020-01-07 DIAGNOSIS — E1169 Type 2 diabetes mellitus with other specified complication: Secondary | ICD-10-CM | POA: Diagnosis not present

## 2020-01-07 DIAGNOSIS — I152 Hypertension secondary to endocrine disorders: Secondary | ICD-10-CM

## 2020-01-07 DIAGNOSIS — I10 Essential (primary) hypertension: Secondary | ICD-10-CM

## 2020-01-07 DIAGNOSIS — E785 Hyperlipidemia, unspecified: Secondary | ICD-10-CM

## 2020-01-07 DIAGNOSIS — E118 Type 2 diabetes mellitus with unspecified complications: Secondary | ICD-10-CM

## 2020-01-07 LAB — POCT GLYCOSYLATED HEMOGLOBIN (HGB A1C): Hemoglobin A1C: 6.6 % — AB (ref 4.0–5.6)

## 2020-01-07 NOTE — Progress Notes (Signed)
  Subjective:    Patient ID: Guy Taylor, male    DOB: 1970/07/12, 49 y.o.   MRN: [id number]  Guy Taylor is a 49 y.o. male who presents for follow-up of Type 2 diabetes mellitus.  Home blood sugar records: Patient does not check them regularly Current symptoms/problems include none and have been unchanged. Daily foot checks:   Any foot concerns: None Exercise: The patient does not participate in regular exercise at present. Diet: The following portions of the patient's history were reviewed and updated as appropriate: allergies, current medications, past medical history, past social history and problem list.  ROS as in subjective above.     Objective:    Physical Exam Alert and in no distress otherwise not examined.  Blood pressure 116/70, pulse 75, temperature (!) 97.3 F (36.3 C), weight (!) 382 lb 3.2 oz (173.4 kg), SpO2 97 %.  Lab Review Diabetic Labs Latest Ref Rng & Units 01/07/2020 08/27/2019 03/08/2019 11/29/2018 02/26/2018  HbA1c 4.0 - 5.6 % 6.6(A) 6.6(A) 6.6(A) 6.2(A) 6.1(A)  Microalbumin mg/L - - 29.6 - -  Micro/Creat Ratio - - - 23.6 - -  Chol 100 - 199 mg/dL - - 183 - 181  HDL >39 mg/dL - - 61 - 73  Calc LDL 0 - 99 mg/dL - - 112(H) - 97  Triglycerides 0 - 149 mg/dL - - 53 - 56  Creatinine 0.76 - 1.27 mg/dL - - 1.17 - 0.97   BP/Weight 01/07/2020 09/25/2019 08/27/2019 03/55/9741 10/24/8451  Systolic BP 646 803 212 248 250  Diastolic BP 70 037 048 84 82  Wt. (Lbs) 382.2 389.4 388.6 369.2 378  BMI 44.17 45 44.91 42.67 44.82   Foot/eye exam completion dates Latest Ref Rng & Units 03/08/2019 01/05/2018  Eye Exam No Retinopathy - No Retinopathy  Foot Form Completion - Done -  Hemoglobin A1c is 6.6  [name]  reports that he has never smoked. He has never used smokeless tobacco. He reports that he does not drink alcohol and does not use drugs.     Assessment & Plan:    Type 2 diabetes mellitus with complication, without long-term current use of insulin (HCC) - Plan: POCT  glycosylated hemoglobin (Hb A1C)  Hypertension associated with diabetes ([place])  Morbid obesity ([place])  Hyperlipidemia associated with type 2 diabetes mellitus ([place])  1. Rx changes: none 2. Education: Reviewed 'ABCs' of diabetes management (respective goals in parentheses):  A1C (<7), blood pressure (<130/80), and cholesterol (LDL <100). 3. Compliance at present is estimated to be good. Efforts to improve compliance (if necessary) will be directed at dietary modifications: Cut back on carbohydrates. 4. Follow up: 4 months I again discussed the need for him to make diet and exercise changes.  He does keep himself physically active.  He states that he plans to lose well over 100 pounds.

## 2020-01-29 ENCOUNTER — Other Ambulatory Visit: Payer: Self-pay | Admitting: Medical

## 2020-01-29 DIAGNOSIS — N529 Male erectile dysfunction, unspecified: Secondary | ICD-10-CM

## 2020-03-09 ENCOUNTER — Encounter: Payer: Self-pay | Admitting: Family Medicine

## 2020-03-09 ENCOUNTER — Ambulatory Visit (INDEPENDENT_AMBULATORY_CARE_PROVIDER_SITE_OTHER): Payer: Managed Care, Other (non HMO) | Admitting: Family Medicine

## 2020-03-09 ENCOUNTER — Other Ambulatory Visit: Payer: Self-pay

## 2020-03-09 VITALS — BP 130/88 | HR 68 | Temp 97.1°F | Ht 77.0 in | Wt 386.2 lb

## 2020-03-09 DIAGNOSIS — Z1211 Encounter for screening for malignant neoplasm of colon: Secondary | ICD-10-CM

## 2020-03-09 DIAGNOSIS — Z5320 Procedure and treatment not carried out because of patient's decision for unspecified reasons: Secondary | ICD-10-CM

## 2020-03-09 DIAGNOSIS — J3489 Other specified disorders of nose and nasal sinuses: Secondary | ICD-10-CM

## 2020-03-09 DIAGNOSIS — Z1152 Encounter for screening for COVID-19: Secondary | ICD-10-CM

## 2020-03-09 DIAGNOSIS — Z0000 Encounter for general adult medical examination without abnormal findings: Secondary | ICD-10-CM

## 2020-03-09 DIAGNOSIS — E1159 Type 2 diabetes mellitus with other circulatory complications: Secondary | ICD-10-CM

## 2020-03-09 DIAGNOSIS — N529 Male erectile dysfunction, unspecified: Secondary | ICD-10-CM

## 2020-03-09 DIAGNOSIS — E118 Type 2 diabetes mellitus with unspecified complications: Secondary | ICD-10-CM

## 2020-03-09 DIAGNOSIS — I152 Hypertension secondary to endocrine disorders: Secondary | ICD-10-CM

## 2020-03-09 DIAGNOSIS — E1169 Type 2 diabetes mellitus with other specified complication: Secondary | ICD-10-CM

## 2020-03-09 DIAGNOSIS — E785 Hyperlipidemia, unspecified: Secondary | ICD-10-CM

## 2020-03-09 DIAGNOSIS — Z23 Encounter for immunization: Secondary | ICD-10-CM

## 2020-03-09 LAB — POC COVID19 BINAXNOW: SARS Coronavirus 2 Ag: NEGATIVE

## 2020-03-09 NOTE — Progress Notes (Signed)
   Subjective:    Patient ID: Guy Taylor, male    DOB: 1971/01/29, 49 y.o.   MRN: [id number]  HPI He is here for complete examination.  He has had difficulty recently with rhinorrhea.  No fever, chills, smell or taste difference.  He does have underlying diabetes.  His last hemoglobin A1c was 6.6.  He continues on Crestor for his lipids as well as losartan/HCTZ, Coreg and Norvasc for his blood pressure.  He does have underlying ADD and Cialis is helping with that.  He apparently is walking regularly and has an appointment to see the ophthalmologist.  He does check his feet regularly.  His work and home life are going well.  Family and social history as well as health maintenance and immunizations was reviewed.   Review of Systems  All other systems reviewed and are negative.      Objective:   Physical Exam Alert and in no distress. Tympanic membranes and canals are normal. Pharyngeal area is normal. Neck is supple without adenopathy or thyromegaly. Cardiac exam shows a regular sinus rhythm without murmurs or gallops. Lungs are clear to auscultation.       Assessment & Plan:  Routine general medical examination at health care facility  Type 2 diabetes mellitus with complication, without long-term current use of insulin ([place]) - Plan: CBC with Differential/Platelet, Comprehensive metabolic panel, Lipid panel, Amb Ref to Medical Weight Management  Hypertension associated with diabetes ([place])  Morbid obesity ([place]) - Plan: Amb Ref to Medical Weight Management  Hyperlipidemia associated with type 2 diabetes mellitus ([place])  Erectile dysfunction, unspecified erectile dysfunction type  Encounter for screening for COVID-19 - Plan: POC COVID-19 BinaxNow  Rhinorrhea  Screening for colon cancer - Plan: Cologuard  Screening for hepatitis C declined - Plan: Hepatitis C antibody  Needs flu shot - Plan: Flu Vaccine QUAD 36+ mos IM He was screened for Covid due to the rhinorrhea.  He is to  continue on his present medications.  I will go ahead and refer him to medical weight loss and wellness.  He is amenable to this.  He will follow up with me in roughly 4 months.  Explained that if he decides to use medical weight loss I will end up seeing him roughly twice per year.

## 2020-03-10 LAB — COMPREHENSIVE METABOLIC PANEL
ALT: 11 IU/L (ref 0–44)
AST: 17 IU/L (ref 0–40)
Albumin/Globulin Ratio: 1.7 (ref 1.2–2.2)
Albumin: 4.5 g/dL (ref 4.0–5.0)
Alkaline Phosphatase: 76 IU/L (ref 44–121)
BUN/Creatinine Ratio: 16 (ref 9–20)
BUN: 16 mg/dL (ref 6–24)
Bilirubin Total: 0.5 mg/dL (ref 0.0–1.2)
CO2: 25 mmol/L (ref 20–29)
Calcium: 9.8 mg/dL (ref 8.7–10.2)
Chloride: 102 mmol/L (ref 96–106)
Creatinine, Ser: 1.03 mg/dL (ref 0.76–1.27)
GFR calc Af Amer: 99 mL/min/{1.73_m2} (ref >59)
GFR calc non Af Amer: 85 mL/min/{1.73_m2} (ref >59)
Globulin, Total: 2.7 g/dL (ref 1.5–4.5)
Glucose: 144 mg/dL — ABNORMAL HIGH (ref 65–99)
Potassium: 4.7 mmol/L (ref 3.5–5.2)
Sodium: 138 mmol/L (ref 134–144)
Total Protein: 7.2 g/dL (ref 6.0–8.5)

## 2020-03-10 LAB — LIPID PANEL
Chol/HDL Ratio: 3 ratio (ref 0.0–5.0)
Cholesterol, Total: 200 mg/dL — ABNORMAL HIGH (ref 100–199)
HDL: 66 mg/dL (ref >39)
LDL Chol Calc (NIH): 124 mg/dL — ABNORMAL HIGH (ref 0–99)
Triglycerides: 57 mg/dL (ref 0–149)
VLDL Cholesterol Cal: 10 mg/dL (ref 5–40)

## 2020-03-10 LAB — CBC WITH DIFFERENTIAL/PLATELET
Basophils Absolute: 0 10*3/uL (ref 0.0–0.2)
Basos: 1 %
EOS (ABSOLUTE): 0.4 10*3/uL (ref 0.0–0.4)
Eos: 9 %
Hematocrit: 42.4 % (ref 37.5–51.0)
Hemoglobin: 14.2 g/dL (ref 13.0–17.7)
Immature Grans (Abs): 0 10*3/uL (ref 0.0–0.1)
Immature Granulocytes: 0 %
Lymphocytes Absolute: 1.4 10*3/uL (ref 0.7–3.1)
Lymphs: 30 %
MCH: 28.5 pg (ref 26.6–33.0)
MCHC: 33.5 g/dL (ref 31.5–35.7)
MCV: 85 fL (ref 79–97)
Monocytes Absolute: 0.4 10*3/uL (ref 0.1–0.9)
Monocytes: 8 %
Neutrophils Absolute: 2.4 10*3/uL (ref 1.4–7.0)
Neutrophils: 52 %
Platelets: 238 10*3/uL (ref 150–450)
RBC: 4.99 x10E6/uL (ref 4.14–5.80)
RDW: 13.1 % (ref 11.6–15.4)
WBC: 4.7 10*3/uL (ref 3.4–10.8)

## 2020-03-10 LAB — HEPATITIS C ANTIBODY: Hep C Virus Ab: <0.1 s/co ratio (ref 0.0–0.9)

## 2020-03-10 MED ORDER — ROSUVASTATIN CALCIUM 40 MG PO TABS: 40.0000 mg | ORAL_TABLET | Freq: Every day | ORAL | 3 refills | Status: DC

## 2020-03-10 NOTE — Addendum Note (Signed)
Addended by: Denita Lung on: 03/10/2020 08:49 AM   Modules accepted: Orders

## 2020-03-14 LAB — COLOGUARD: Cologuard: NEGATIVE

## 2020-03-15 LAB — COLOGUARD

## 2020-03-24 LAB — HM DIABETES EYE EXAM

## 2020-03-27 LAB — EXTERNAL GENERIC LAB PROCEDURE: COLOGUARD: NEGATIVE

## 2020-03-27 LAB — COLOGUARD: COLOGUARD: NEGATIVE

## 2020-03-30 NOTE — Progress Notes (Signed)
Pt was advised of negative cologuard results. [place]

## 2020-03-31 ENCOUNTER — Encounter: Payer: Self-pay | Admitting: Family Medicine

## 2020-04-03 ENCOUNTER — Other Ambulatory Visit: Payer: Self-pay

## 2020-04-03 ENCOUNTER — Encounter: Payer: Self-pay | Admitting: Medical

## 2020-04-03 ENCOUNTER — Ambulatory Visit: Payer: Managed Care, Other (non HMO) | Admitting: Medical

## 2020-04-03 VITALS — BP 146/86 | HR 90 | Ht 77.0 in | Wt 388.6 lb

## 2020-04-03 DIAGNOSIS — M791 Myalgia, unspecified site: Secondary | ICD-10-CM | POA: Diagnosis not present

## 2020-04-03 DIAGNOSIS — R079 Chest pain, unspecified: Secondary | ICD-10-CM

## 2020-04-03 DIAGNOSIS — E785 Hyperlipidemia, unspecified: Secondary | ICD-10-CM

## 2020-04-03 DIAGNOSIS — E1169 Type 2 diabetes mellitus with other specified complication: Secondary | ICD-10-CM

## 2020-04-03 DIAGNOSIS — E1159 Type 2 diabetes mellitus with other circulatory complications: Secondary | ICD-10-CM

## 2020-04-03 DIAGNOSIS — E118 Type 2 diabetes mellitus with unspecified complications: Secondary | ICD-10-CM

## 2020-04-03 DIAGNOSIS — I152 Hypertension secondary to endocrine disorders: Secondary | ICD-10-CM

## 2020-04-03 MED ORDER — OMEPRAZOLE 40 MG PO CPDR: 40.0000 mg | DELAYED_RELEASE_CAPSULE | Freq: Every day | ORAL | 1 refills | Status: DC

## 2020-04-03 NOTE — Progress Notes (Signed)
Subjective:  Guy Taylor is a 49 y.o. male who presents for Chief Complaint  Patient presents with  . Chest Pain    x3 days      He notes 3 day having chest pains.   From sitting to standing gets a pain.  Taking a deep breath can hurt.  Yesterday was rough.  Pains can hit him out of the blue.  He will pause and rest, and within seconds the pain resolves.  With the pain gets SOB.  No nausea, no sweats.  No pain radiating to jaw or arm.    Walks all day at work.  That doesn't bother him, but getting on and off the table or chart can get a pain.    Has some tingling in hands at times.   No recent illness, no cough, no cold symptoms.  Not sure about GERD.  Thought it could be.  At times the pain feels like gas.    He does eat a lot of spicy foods.   Home BPs general 128/70-80s.    Home blood sugars recently 120-150 few hours after lunch.  No family hx/o heart issues.   Nonsmoker, no alcohol use, no drugs.   No other aggravating or relieving factors.    No other c/o.  Past Medical History:  Diagnosis Date  . Allergic rhinitis   . Asthma   . Diabetes mellitus   . Hypertension   . Hypogonadism male   . Meniscus tear    rt knee  . Obesity   . Renal disorder   . Stones in the urinary tract    Current Outpatient Medications on File Prior to Visit  Medication Sig Dispense Refill  . Alcohol Swabs (ALCOHOL PREP) PADS 1 each by Does not apply route 2 (two) times daily.    Marland Kitchen amLODipine (NORVASC) 10 MG tablet Take 1 tablet (10 mg total) by mouth daily. 90 tablet 3  . carvedilol (COREG) 25 MG tablet Take 1 tablet (25 mg total) by mouth 2 (two) times daily with a meal. 180 tablet 3  . glucose blood test strip Test twice a day. Pt uses a one touch ultra meter 100 each 6  . losartan-hydrochlorothiazide (HYZAAR) 100-12.5 MG tablet Take 1 tablet by mouth daily. 90 tablet 3  . rosuvastatin (CRESTOR) 40 MG tablet Take 1 tablet (40 mg total) by mouth daily. 90 tablet 3  . tadalafil (CIALIS)  20 MG tablet TAKE 1 TABLET DAILY AS NEEDED FOR ERECTILE DYSFUNCTION 90 tablet 3  . TRUEPLUS LANCETS 30G MISC USE AS DIRECTED TO TEST TWICE DAILY 100 each 3  . cyclobenzaprine (FLEXERIL) 10 MG tablet Take 1 tablet (10 mg total) by mouth at bedtime. (Patient not taking: Reported on 07/27/2018) 15 tablet 0  . traMADol (ULTRAM) 50 MG tablet Take 1 tablet (50 mg total) by mouth every 8 (eight) hours as needed. (Patient not taking: Reported on 07/27/2018) 30 tablet 0   No current facility-administered medications on file prior to visit.   Family History  Problem Relation Age of Onset  . Diabetes Mother      The following portions of the patient's history were reviewed and updated as appropriate: allergies, current medications, past family history, past medical history, past social history, past surgical history and problem list.  ROS Otherwise as in subjective above  Objective: BP (!) 146/86   Pulse 90   Ht 6' 5"  (1.956 m)   Wt (!) 388 lb 9.6 oz (176.3 kg)   SpO2  95%   BMI 46.08 kg/m    Wt Readings from Last 3 Encounters:  04/03/20 (!) 388 lb 9.6 oz (176.3 kg)  03/09/20 (!) 386 lb 3.2 oz (175.2 kg)  01/07/20 (!) 382 lb 3.2 oz (173.4 kg)   General appearance: alert, no distress, well developed, well nourished Neck: supple, no lymphadenopathy, no thyromegaly, no masses, no JVD Heart: RRR, normal S1, S2, no murmurs Lungs: CTA bilaterally, no wheezes, rhonchi, or rales Chest tender to touch along sternum Abdomen: +bs, soft, non tender, non distended, no masses, no hepatomegaly, no splenomegaly Pulses: 2+ radial pulses, 2+ pedal pulses, normal cap refill Ext: no edema   EKG Rate 71 bpm, PR 196 ms, QRS 94 ms, QTC 406 ms, axis 36 degrees, normal sinus rhythm, no acute changes   Assessment: Encounter Diagnoses  Name Primary?  . Chest pain, unspecified type Yes  . Muscle tenderness   . Hypertension associated with diabetes ([place])   . Hyperlipidemia associated with type 2 diabetes  mellitus ([place])   . Type II diabetes mellitus with complication ([place])   . Morbid obesity ([place])      Plan: We discussed his symptoms and concerns.  He has no chest pain or symptoms with activity.  He does have tenderness with palpation of the chest muscles.  He did start a new workout routine in the last several weeks at home with weights.  He does eat a lot of spicy foods.  I gave him a sample of antacids and sent omeprazole to the pharmacy for him to begin right away.  Advised Tylenol for pain, can do heat and stretching over the weekend  Discussed signs and symptoms of acute coronary syndrome.  Advised of any change in symptoms such as chest pain with activity, chest pain or worsening over the weekend, feeling of elephant on his chest, or shortness of breath, sweats, nausea, dizziness associated with chest pain, numbness or tingling particularly worsening paresthesias, pain down the left arm or jaw, then he would need to call [phone]  We did discuss his risk factors obesity diabetes hypertension but it sounds like those are well controlled  Guy Taylor was seen today for chest pain.  Diagnoses and all orders for this visit:  Chest pain, unspecified type  Muscle tenderness  Hypertension associated with diabetes ([place])  Hyperlipidemia associated with type 2 diabetes mellitus ([place])  Type II diabetes mellitus with complication ([place])  Morbid obesity ([place])  Other orders -     omeprazole (PRILOSEC) 40 MG capsule; Take 1 capsule (40 mg total) by mouth daily.    Follow up: Follow-up as needed

## 2020-04-07 ENCOUNTER — Encounter: Payer: Self-pay | Admitting: Family Medicine

## 2020-04-07 NOTE — Addendum Note (Signed)
Addended by: Carlena Hurl on: 04/07/2020 03:26 PM   Modules accepted: Orders

## 2020-04-14 ENCOUNTER — Encounter: Payer: Self-pay | Admitting: Family Medicine

## 2020-05-20 ENCOUNTER — Other Ambulatory Visit: Payer: Self-pay

## 2020-05-20 ENCOUNTER — Ambulatory Visit: Payer: Managed Care, Other (non HMO) | Admitting: Family Medicine

## 2020-05-20 VITALS — BP 110/70 | HR 79 | Temp 97.9°F | Wt 397.0 lb

## 2020-05-20 DIAGNOSIS — M25531 Pain in right wrist: Secondary | ICD-10-CM

## 2020-05-20 DIAGNOSIS — M545 Low back pain, unspecified: Secondary | ICD-10-CM

## 2020-05-20 NOTE — Progress Notes (Signed)
   Subjective:    Patient ID: IANN RODIER, male    DOB: 10-17-1970, 49 y.o.   MRN: [id number]  HPI He is here for consult concerning a 67-monthhistory of low back pain.  No history of injury, overuse or radiation of the pain.  He is able to do his activities of daily living without difficulty but notes trouble mainly after he lies down or sits for a period of time and then gets up.  He has not been taking any medications for this.  He also complains of right wrist pain with no history of injury or overuse.  He says the pain radiates down to the third finger.   Review of Systems     Objective:   Physical Exam Alert and in no distress.  Exam of the right wrist does show normal range of motion with some slight weakness with dorsiflexion.  Slight tenderness palpation over the midcarpal area.  No effusion is noted.  Fingers are normal. Back exam shows good motion of the back with no palpable tenderness.  Normal hip motion.  Straight leg raising is negative.  DTRs difficult to assess but appear normal.       Assessment & Plan:  Right wrist pain  Acute midline low back pain without sciatica I recommended conservative care for both of these. Take 2 Aleve twice per day regularly for the next 10 days.  You can also take 2 Tylenol 4 times per day regularly.  Back off on the things that hurt the wrist if you can the same thing with the back.  With the back in particular do some stretching exercises If he has continued difficulty, he should return here for further evaluation

## 2020-05-20 NOTE — Patient Instructions (Signed)
Take 2 Aleve twice per day regularly for the next 10 days.  You can also take 2 Tylenol 4 times per day regularly.  Back off on the things that hurt the wrist if you can the same thing with the back.  With the back in particular do some stretching exercises

## 2020-06-09 ENCOUNTER — Ambulatory Visit (INDEPENDENT_AMBULATORY_CARE_PROVIDER_SITE_OTHER): Payer: Managed Care, Other (non HMO) | Admitting: Family Medicine

## 2020-06-19 ENCOUNTER — Telehealth: Payer: Self-pay

## 2020-06-19 ENCOUNTER — Other Ambulatory Visit: Payer: Self-pay

## 2020-06-19 MED ORDER — OMEPRAZOLE 40 MG PO CPDR: 40.0000 mg | DELAYED_RELEASE_CAPSULE | Freq: Every day | ORAL | 1 refills | Status: DC

## 2020-06-19 NOTE — Telephone Encounter (Signed)
Pt. [facility]ing [facility]/20/20 and next apt is 07/14/20.

## 2020-06-19 NOTE — Telephone Encounter (Signed)
Done and pt is advised [facility]

## 2020-06-23 ENCOUNTER — Ambulatory Visit (INDEPENDENT_AMBULATORY_CARE_PROVIDER_SITE_OTHER): Payer: Managed Care, Other (non HMO) | Admitting: Family Medicine

## 2020-07-08 ENCOUNTER — Ambulatory Visit: Payer: Managed Care, Other (non HMO) | Admitting: Family Medicine

## 2020-07-14 ENCOUNTER — Encounter: Payer: Self-pay | Admitting: Family Medicine

## 2020-07-14 ENCOUNTER — Other Ambulatory Visit: Payer: Self-pay

## 2020-07-14 ENCOUNTER — Ambulatory Visit (INDEPENDENT_AMBULATORY_CARE_PROVIDER_SITE_OTHER): Payer: Managed Care, Other (non HMO) | Admitting: Family Medicine

## 2020-07-14 VITALS — BP 118/76 | HR 69 | Temp 97.2°F | Wt 383.2 lb

## 2020-07-14 DIAGNOSIS — E1169 Type 2 diabetes mellitus with other specified complication: Secondary | ICD-10-CM

## 2020-07-14 DIAGNOSIS — E1159 Type 2 diabetes mellitus with other circulatory complications: Secondary | ICD-10-CM

## 2020-07-14 DIAGNOSIS — I152 Hypertension secondary to endocrine disorders: Secondary | ICD-10-CM

## 2020-07-14 DIAGNOSIS — J301 Allergic rhinitis due to pollen: Secondary | ICD-10-CM

## 2020-07-14 DIAGNOSIS — E118 Type 2 diabetes mellitus with unspecified complications: Secondary | ICD-10-CM

## 2020-07-14 DIAGNOSIS — E785 Hyperlipidemia, unspecified: Secondary | ICD-10-CM

## 2020-07-14 LAB — POCT GLYCOSYLATED HEMOGLOBIN (HGB A1C): Hemoglobin A1C: 6.9 % — AB (ref 4.0–5.6)

## 2020-07-14 NOTE — Progress Notes (Signed)
°  Subjective:    Patient ID: Guy Taylor, male    DOB: April 19, 1971, 50 y.o.   MRN: [id number]  Guy Taylor is a 50 y.o. male who presents for follow-up of Type 2 diabetes mellitus.  Home blood sugar records: meter records, post meal, 128-162 Current symptoms/problems include none at this time. Daily foot checks:yes   Any foot concerns: none Exercise: staying active Diet: fair.  He is now on a keto diet and has lost several pounds.  He has been on it in the past and did well on it but stopped and does not remember why he stopped.  He is using Prilosec on an as-needed basis.  He continues on Crestor without difficulty.  Cialis is helping for his ED.  Continues on amlodipine, Coreg and losartan/HCTZ.  His goal is to get off of his blood pressure medications.  His allergies are causing no difficulty. The following portions of the patient's history were reviewed and updated as appropriate: allergies, current medications, past medical history, past social history and problem list.  ROS as in subjective above.     Objective:    Physical Exam Alert and in no distress otherwise not examined.  Hemoglobin A1c is 6.9 Lab Review Diabetic Labs Latest Ref Rng & Units 03/09/2020 01/07/2020 08/27/2019 03/08/2019 11/29/2018  HbA1c 4.0 - 5.6 % - 6.6(A) 6.6(A) 6.6(A) 6.2(A)  Microalbumin mg/L - - - 29.6 -  Micro/Creat Ratio - - - - 23.6 -  Chol 100 - 199 mg/dL 200(H) - - 183 -  HDL >39 mg/dL 66 - - 61 -  Calc LDL 0 - 99 mg/dL 124(H) - - 112(H) -  Triglycerides 0 - 149 mg/dL 57 - - 53 -  Creatinine 0.76 - 1.27 mg/dL 1.03 - - 1.17 -   BP/Weight 05/20/2020 04/03/2020 03/09/2020 08/29/8117 05/27/7827  Systolic BP 562 130 865 784 696  Diastolic BP 70 86 88 70 295  Wt. (Lbs) 397 388.6 386.2 382.2 389.4  BMI 47.08 46.08 45.8 44.17 45   Foot/eye exam completion dates Latest Ref Rng & Units 03/24/2020 03/09/2020  Eye Exam No Retinopathy No Retinopathy -  Foot Form Completion - - Done    [place]  reports that  he has never smoked. He has never used smokeless tobacco. He reports that he does not drink alcohol and does not use drugs.     Assessment & Plan:    Type 2 diabetes mellitus with complication, without long-term current use of insulin (HCC) - Plan: POCT glycosylated hemoglobin (Hb A1C)  Type II diabetes mellitus with complication (HCC)  Hypertension associated with diabetes ([place])  Morbid obesity ([place])  Hyperlipidemia associated with type 2 diabetes mellitus (HCC)  Seasonal allergic rhinitis due to pollen   1. Rx changes: none 2. Education: Reviewed ABCs of diabetes management (respective goals in parentheses):  A1C (<7), blood pressure (<130/80), and cholesterol (LDL <100). 3. Compliance at present is estimated to be good. Efforts to improve compliance (if necessary) will be directed at Continue keto diet but do not stop. 4. Follow up: 4 months  I complemented him on getting involved with the keto diet but strongly encouraged him to continue it forever.

## 2020-10-02 ENCOUNTER — Encounter (HOSPITAL_COMMUNITY): Payer: Self-pay | Admitting: Emergency Medicine

## 2020-10-02 ENCOUNTER — Other Ambulatory Visit: Payer: Self-pay

## 2020-10-02 ENCOUNTER — Other Ambulatory Visit: Payer: Self-pay | Admitting: Family Medicine

## 2020-10-02 ENCOUNTER — Ambulatory Visit (HOSPITAL_COMMUNITY)
Admission: EM | Admit: 2020-10-02 | Discharge: 2020-10-02 | Disposition: A | Discharge status: Home / Self Care | Payer: Managed Care, Other (non HMO) | Attending: Family Medicine | Admitting: Family Medicine

## 2020-10-02 DIAGNOSIS — K2900 Acute gastritis without bleeding: Secondary | ICD-10-CM | POA: Insufficient documentation

## 2020-10-02 DIAGNOSIS — R1013 Epigastric pain: Secondary | ICD-10-CM

## 2020-10-02 DIAGNOSIS — I152 Hypertension secondary to endocrine disorders: Secondary | ICD-10-CM

## 2020-10-02 DIAGNOSIS — E1159 Type 2 diabetes mellitus with other circulatory complications: Secondary | ICD-10-CM

## 2020-10-02 LAB — CBC WITH DIFFERENTIAL/PLATELET
Abs Immature Granulocytes: 0.02 10*3/uL (ref 0.00–0.07)
Basophils Absolute: 0 10*3/uL (ref 0.0–0.1)
Basophils Relative: 1 %
Eosinophils Absolute: 0.1 10*3/uL (ref 0.0–0.5)
Eosinophils Relative: 1 %
HCT: 43.7 % (ref 39.0–52.0)
Hemoglobin: 13.9 g/dL (ref 13.0–17.0)
Immature Granulocytes: 0 %
Lymphocytes Relative: 21 %
Lymphs Abs: 1.2 10*3/uL (ref 0.7–4.0)
MCH: 27.6 pg (ref 26.0–34.0)
MCHC: 31.8 g/dL (ref 30.0–36.0)
MCV: 86.9 fL (ref 80.0–100.0)
Monocytes Absolute: 0.5 10*3/uL (ref 0.1–1.0)
Monocytes Relative: 8 %
Neutro Abs: 4.1 10*3/uL (ref 1.7–7.7)
Neutrophils Relative %: 69 %
Platelets: 259 10*3/uL (ref 150–400)
RBC: 5.03 MIL/uL (ref 4.22–5.81)
RDW: 13.5 % (ref 11.5–15.5)
WBC: 5.9 10*3/uL (ref 4.0–10.5)
nRBC: 0 % (ref 0.0–0.2)

## 2020-10-02 LAB — COMPREHENSIVE METABOLIC PANEL
ALT: 24 U/L (ref 0–44)
AST: 23 U/L (ref 15–41)
Albumin: 4.3 g/dL (ref 3.5–5.0)
Alkaline Phosphatase: 60 U/L (ref 38–126)
Anion gap: 7 (ref 5–15)
BUN: 12 mg/dL (ref 6–20)
CO2: 27 mmol/L (ref 22–32)
Calcium: 9.5 mg/dL (ref 8.9–10.3)
Chloride: 105 mmol/L (ref 98–111)
Creatinine, Ser: 1.11 mg/dL (ref 0.61–1.24)
GFR, Estimated: >60 mL/min (ref >60)
Glucose, Bld: 161 mg/dL — ABNORMAL HIGH (ref 70–99)
Potassium: 4.2 mmol/L (ref 3.5–5.1)
Sodium: 139 mmol/L (ref 135–145)
Total Bilirubin: 1 mg/dL (ref 0.3–1.2)
Total Protein: 7.3 g/dL (ref 6.5–8.1)

## 2020-10-02 LAB — LIPASE, BLOOD: Lipase: 31 U/L (ref 11–51)

## 2020-10-02 MED ORDER — LIDOCAINE VISCOUS HCL 2 % MT SOLN
15.0000 mL | Freq: Once | OROMUCOSAL | Status: AC
  Administered 2020-10-02: 15 mL via ORAL

## 2020-10-02 MED ORDER — ALUM & MAG HYDROXIDE-SIMETH 200-200-20 MG/5ML PO SUSP
30.0000 mL | Freq: Once | ORAL | Status: AC
  Administered 2020-10-02: 30 mL via ORAL

## 2020-10-02 MED ORDER — PANTOPRAZOLE SODIUM 40 MG PO TBEC: 40.0000 mg | DELAYED_RELEASE_TABLET | Freq: Two times a day (BID) | ORAL | 1 refills | Status: DC

## 2020-10-02 MED ORDER — ALUM & MAG HYDROXIDE-SIMETH 200-200-20 MG/5ML PO SUSP
ORAL | Status: AC
  Filled 2020-10-02: qty 30

## 2020-10-02 MED ORDER — SUCRALFATE 1 G PO TABS: 1.0000 g | ORAL_TABLET | Freq: Three times a day (TID) | ORAL | 0 refills | Status: DC

## 2020-10-02 MED ORDER — LIDOCAINE VISCOUS HCL 2 % MT SOLN
OROMUCOSAL | Status: AC
  Filled 2020-10-02: qty 15

## 2020-10-02 NOTE — ED Triage Notes (Addendum)
Pt presents with upper abdominal xs 2 days. States when he gets the hiccups the pain gets severe and causes him to vomit.   Pt aware that we do not have means of abdominal imaging at [facility].

## 2020-10-06 NOTE — ED Provider Notes (Signed)
[place]    CSN: [id number] Arrival date & time: 10/02/20  1536      History   Chief Complaint Chief Complaint  Patient presents with  . Abdominal Pain  . Nausea  . Emesis    HPI Guy Taylor is a 50 y.o. male.   Presenting today with 2-day history of significant upper abdominal pain, hiccups, burning up into her throat and sometimes vomiting some clear fluid.  Eating does not seem to make it better or worse.  Laying flat does seem to make it worse.  He denies chest pain, shortness of breath, palpitations, fever, diarrhea or constipation, new foods or medications, recent sick contacts, history of similar episodes.  Does have a history of heartburn from time to time, does not take anything anymore for this but used to be on Prilosec.  Past medical history significant for hypertension, diabetes, hyperlipidemia, morbid obesity.     Past Medical History:  Diagnosis Date  . Allergic rhinitis   . Asthma   . Diabetes mellitus   . Hypertension   . Hypogonadism male   . Meniscus tear    rt knee  . Obesity   . Renal disorder   . Stones in the urinary tract     Patient Active Problem List   Diagnosis Date Noted  . Bilateral testicular atrophy 04/28/2016  . Renal stone 03/03/2016  . Other male erectile dysfunction 12/11/2014  . Type 2 diabetes mellitus with complication, without long-term current use of insulin ([place]) 07/26/2011  . Hypertension associated with diabetes ([place]) 07/26/2011  . Morbid obesity ([place]) 07/26/2011  . Hyperlipidemia associated with type 2 diabetes mellitus ([place]) 07/26/2011  . Hypogonadism male 07/26/2011  . Allergic rhinitis, seasonal 07/26/2011    Past Surgical History:  Procedure Laterality Date  . ESOPHAGOGASTRODUODENOSCOPY N/A 11/16/2014   Procedure: ESOPHAGOGASTRODUODENOSCOPY (EGD);  Surgeon: Laurence Spates, MD;  Location[facility]COPY;  Service: Endoscopy;  Laterality: N/A;  . KNEE ARTHROSCOPY  02/13/2012   Procedure: ARTHROSCOPY  KNEE;  Surgeon: Alta Corning, MD;  Location: [facility];  Service: Orthopedics;  Laterality: Right;       Home Medications    Prior to Admission medications   Medication Sig Start Date End Date Taking? Authorizing Provider  pantoprazole (PROTONIX) 40 MG tablet Take 1 tablet (40 mg total) by mouth 2 (two) times daily before a meal. 10/02/20  Yes Volney American, PA-C  sucralfate (CARAFATE) 1 g tablet Take 1 tablet (1 g total) by mouth 4 (four) times daily -  with meals and at bedtime. 10/02/20  Yes Volney American, PA-C  Alcohol Swabs (ALCOHOL PREP) PADS 1 each by Does not apply route 2 (two) times daily.    [provider]  amLODipine (NORVASC) 10 MG tablet TAKE 1 TABLET DAILY 10/02/20   Denita Lung, MD  carvedilol (COREG) 25 MG tablet TAKE 1 TABLET TWICE A DAY WITH MEALS 10/02/20   Denita Lung, MD  glucose blood test strip Test twice a day. Pt uses a one touch ultra meter 08/14/15   Denita Lung, MD  losartan-hydrochlorothiazide [facility]) 100-12.5 MG tablet TAKE 1 TABLET DAILY 10/02/20   Denita Lung, MD  omeprazole (PRILOSEC) 40 MG capsule Take 1 capsule (40 mg total) by mouth daily. 06/19/20   Denita Lung, MD  rosuvastatin (CRESTOR) 40 MG tablet Take 1 tablet (40 mg total) by mouth daily. 03/10/20   Denita Lung, MD  tadalafil (CIALIS) 20 MG tablet TAKE 1 TABLET  DAILY AS NEEDED FOR ERECTILE DYSFUNCTION 01/31/20   Denita Lung, MD  TRUEPLUS LANCETS 30G MISC USE AS DIRECTED TO TEST TWICE DAILY 02/25/16   Denita Lung, MD    Family History Family History  Problem Relation Age of Onset  . Diabetes Mother     Social History Social History   Tobacco Use  . Smoking status: Never Smoker  . Smokeless tobacco: Never Used  Substance Use Topics  . Alcohol use: No  . Drug use: No     Allergies   Lisinopril   Review of Systems Review of Systems Per HPI Physical Exam Triage Vital Signs ED Triage Vitals  Enc Vitals Group     BP 10/02/20 1629  (!) 176/103     Pulse Rate 10/02/20 1629 74     Resp 10/02/20 1629 19     Temp 10/02/20 1629 99.3 F (37.4 C)     Temp Source 10/02/20 1629 Oral     SpO2 10/02/20 1629 97 %     Weight --      Height --      Head Circumference --      Peak Flow --      Pain Score 10/02/20 1626 6     Pain Loc --      Pain Edu? --      Excl. in [place]? --    No data found.  Updated Vital Signs BP (!) 176/103 (BP Location: Left Arm)   Pulse 74   Temp 99.3 F (37.4 C) (Oral)   Resp 19   SpO2 97%   Visual Acuity Right Eye Distance:   Left Eye Distance:   Bilateral Distance:    Right Eye Near:   Left Eye Near:    Bilateral Near:     Physical Exam Vitals and nursing note reviewed.  Constitutional:      Appearance: Normal appearance.  HENT:     Head: Atraumatic.     Mouth/Throat:     Mouth: Mucous membranes are moist.     Pharynx: Oropharynx is clear. Posterior oropharyngeal erythema present. No oropharyngeal exudate.  Eyes:     Extraocular Movements: Extraocular movements intact.     Conjunctiva/sclera: Conjunctivae normal.  Cardiovascular:     Rate and Rhythm: Normal rate and regular rhythm.  Pulmonary:     Effort: Pulmonary effort is normal.     Breath sounds: Normal breath sounds.  Abdominal:     General: Bowel sounds are normal. There is no distension.     Palpations: Abdomen is soft. There is no mass.     Tenderness: There is abdominal tenderness. There is guarding. There is no rebound.     Comments: Moderate to significant epigastric abdominal pain with mild guarding on palpation  Musculoskeletal:        General: Normal range of motion.     Cervical back: Normal range of motion and neck supple.  Skin:    General: Skin is warm and dry.  Neurological:     General: No focal deficit present.     Mental Status: He is oriented to person, place, and time.  Psychiatric:        Mood and Affect: Mood normal.        Thought Content: Thought content normal.        Judgment: Judgment  normal.      UC Treatments / Results  Labs (all labs ordered are listed, but only abnormal results are displayed) Labs Reviewed  COMPREHENSIVE  METABOLIC PANEL - Abnormal; Notable for the following components:      Result Value   Glucose, Bld 161 (*)    All other components within normal limits  CBC WITH DIFFERENTIAL/PLATELET  LIPASE, BLOOD    EKG   Radiology No results found.  Procedures Procedures (including critical care time)  Medications Ordered in UC Medications  alum & mag hydroxide-simeth (MAALOX/MYLANTA) 200-200-20 MG/5ML suspension 30 mL (30 mLs Oral Given 10/02/20 1730)    And  lidocaine (XYLOCAINE) 2 % viscous mouth solution 15 mL (15 mLs Oral Given 10/02/20 1730)    Initial Impression / Assessment and Plan / UC Course  I have reviewed the triage vital signs and the nursing notes.  Pertinent labs & imaging results that were available during my care of the patient were reviewed by me and considered in my medical decision making (see chart for details).     Suspect gastritis, GI cocktail given in clinic and patient with marked improvement after 20-30 minutes.  Will obtain basic labs for further rule out and start twice daily Protonix, Carafate as needed, bland diet and follow-up closely with primary care for recheck.  Strict ED precautions given if acutely worsening symptoms.  Final Clinical Impressions(s) / UC Diagnoses   Final diagnoses:  Abdominal pain, epigastric  Acute gastritis, presence of bleeding unspecified, unspecified gastritis type   Discharge Instructions   None    ED Prescriptions    Medication Sig Dispense Auth. Provider   pantoprazole (PROTONIX) 40 MG tablet Take 1 tablet (40 mg total) by mouth 2 (two) times daily before a meal. 60 tablet Volney American, PA-C   sucralfate (CARAFATE) 1 g tablet Take 1 tablet (1 g total) by mouth 4 (four) times daily -  with meals and at bedtime. 120 tablet Volney American, [place]     PDMP  not reviewed this encounter.   Volney American, [place] 10/06/20 0725

## 2020-11-10 ENCOUNTER — Ambulatory Visit: Payer: Managed Care, Other (non HMO) | Admitting: Family Medicine

## 2020-12-31 ENCOUNTER — Other Ambulatory Visit: Payer: Self-pay | Admitting: Family Medicine

## 2020-12-31 DIAGNOSIS — E1159 Type 2 diabetes mellitus with other circulatory complications: Secondary | ICD-10-CM

## 2020-12-31 DIAGNOSIS — I152 Hypertension secondary to endocrine disorders: Secondary | ICD-10-CM

## 2021-01-06 ENCOUNTER — Ambulatory Visit: Payer: Managed Care, Other (non HMO) | Admitting: Family Medicine

## 2021-01-06 ENCOUNTER — Other Ambulatory Visit: Payer: Self-pay

## 2021-01-06 VITALS — BP 120/76 | HR 76 | Temp 96.0°F | Wt 391.8 lb

## 2021-01-06 DIAGNOSIS — E785 Hyperlipidemia, unspecified: Secondary | ICD-10-CM

## 2021-01-06 DIAGNOSIS — E1159 Type 2 diabetes mellitus with other circulatory complications: Secondary | ICD-10-CM | POA: Diagnosis not present

## 2021-01-06 DIAGNOSIS — E1169 Type 2 diabetes mellitus with other specified complication: Secondary | ICD-10-CM | POA: Diagnosis not present

## 2021-01-06 DIAGNOSIS — I152 Hypertension secondary to endocrine disorders: Secondary | ICD-10-CM

## 2021-01-06 DIAGNOSIS — E118 Type 2 diabetes mellitus with unspecified complications: Secondary | ICD-10-CM | POA: Diagnosis not present

## 2021-01-06 MED ORDER — METFORMIN HCL 500 MG PO TABS: 500.0000 mg | ORAL_TABLET | Freq: Two times a day (BID) | ORAL | 3 refills | Status: DC

## 2021-01-06 NOTE — Progress Notes (Signed)
  Subjective:    Patient ID: [name], male    DOB: [birth year], 50 y.o.   MRN: [id number]  Guy Taylor is a 50 y.o. male who presents for follow-up of Type 2 diabetes mellitus.  Home blood sugar records: fasting range: 120 lowest and 180 highest Current symptoms/problems include none and have been stable. Daily foot checks:yes   Any foot concerns: spot on left foot Exercise:  staying active .  He recently joined [employer] Diet: good He continues on amlodipine, Coreg, losartan/HCTZ.  He is also taking Crestor.  His weight has gone up slightly. The following portions of the patient's history were reviewed and updated as appropriate: allergies, current medications, past medical history, past social history and problem list.  ROS as in subjective above.     Objective:    Physical Exam Alert and in no distress otherwise not examined.  Blood pressure 120/76, pulse 76, temperature (!) 96 F (35.6 C), weight (!) 391 lb 12.8 oz (177.7 kg), SpO2 97 %.  Lab Review Diabetic Labs Latest Ref Rng & Units 10/02/2020 07/14/2020 03/09/2020 01/07/2020 08/27/2019  HbA1c 4.0 - 5.6 % - 6.9(A) - 6.6(A) 6.6(A)  Microalbumin mg/L - - - - -  Micro/Creat Ratio - - - - - -  Chol 100 - 199 mg/dL - - 200(H) - -  HDL >39 mg/dL - - 66 - -  Calc LDL 0 - 99 mg/dL - - 124(H) - -  Triglycerides 0 - 149 mg/dL - - 57 - -  Creatinine 0.61 - 1.24 mg/dL 1.11 - 1.03 - -   BP/Weight 01/06/2021 10/02/2020 07/14/2020 05/20/2020 78/29/5621  Systolic BP 308 657 846 962 952  Diastolic BP 76 841 76 70 86  Wt. (Lbs) 391.8 - 383.2 397 388.6  BMI 46.46 - 45.44 47.08 46.08   Foot/eye exam completion dates Latest Ref Rng & Units 03/24/2020 03/09/2020  Eye Exam No Retinopathy No Retinopathy -  Foot Form Completion - - Done   Hemoglobin A1c is 7.5 Guy Taylor  reports that he has never smoked. He has never used smokeless tobacco. He reports that he does not drink alcohol and does not use drugs.     Assessment & Plan:    Type 2  diabetes mellitus with complication, without long-term current use of insulin (HCC) - Plan: POCT glycosylated hemoglobin (Hb A1C), metFORMIN (GLUCOPHAGE) 500 MG tablet  Hypertension associated with diabetes ([place])  Morbid obesity ([place])  Hyperlipidemia associated with type 2 diabetes mellitus ([place])  Rx changes: I will place him back on metformin Education: Reviewed 'ABCs' of diabetes management (respective goals in parentheses):  A1C (<7), blood pressure (<130/80), and cholesterol (LDL <100). Compliance at present is estimated to be fair. Efforts to improve compliance (if necessary) will be directed at increased exercise. Follow up: 4 months Discussed diet and exercise with him again.  Encouraged him to continue to work at [employer].  Discussed the use of another diabetes medication specifically GLP-1 in the future to help with diabetes and with weight loss.

## 2021-01-07 LAB — POCT GLYCOSYLATED HEMOGLOBIN (HGB A1C): Hemoglobin A1C: 7.5 % — AB (ref 4.0–5.6)

## 2021-03-18 ENCOUNTER — Other Ambulatory Visit: Payer: Self-pay

## 2021-03-18 ENCOUNTER — Ambulatory Visit
Admission: RE | Admit: 2021-03-18 | Discharge: 2021-03-18 | Disposition: A | Discharge status: Home / Self Care | Payer: Managed Care, Other (non HMO) | Source: Ambulatory Visit | Attending: Family Medicine | Admitting: Family Medicine

## 2021-03-18 ENCOUNTER — Ambulatory Visit: Payer: Managed Care, Other (non HMO) | Admitting: Family Medicine

## 2021-03-18 VITALS — BP 134/80 | HR 88 | Temp 97.9°F | Wt >= 6400 oz

## 2021-03-18 DIAGNOSIS — E1159 Type 2 diabetes mellitus with other circulatory complications: Secondary | ICD-10-CM

## 2021-03-18 DIAGNOSIS — I152 Hypertension secondary to endocrine disorders: Secondary | ICD-10-CM

## 2021-03-18 DIAGNOSIS — E118 Type 2 diabetes mellitus with unspecified complications: Secondary | ICD-10-CM

## 2021-03-18 DIAGNOSIS — Z23 Encounter for immunization: Secondary | ICD-10-CM

## 2021-03-18 DIAGNOSIS — N529 Male erectile dysfunction, unspecified: Secondary | ICD-10-CM

## 2021-03-18 DIAGNOSIS — M25512 Pain in left shoulder: Secondary | ICD-10-CM

## 2021-03-18 LAB — POCT GLYCOSYLATED HEMOGLOBIN (HGB A1C): Hemoglobin A1C: 6.7 % — AB (ref 4.0–5.6)

## 2021-03-18 MED ORDER — TADALAFIL 20 MG PO TABS: 20.0000 mg | ORAL_TABLET | ORAL | 5 refills | Status: DC | PRN

## 2021-03-18 NOTE — Progress Notes (Signed)
   Subjective:    Patient ID: Guy Taylor, male    DOB: [birth year], 50 y.o.   MRN: [id number]  HPI He is here for an interval evaluation.  He complains of a 10-monthhistory of left shoulder pain.  He neglected to mention this in the past but now it is getting worse and actually interfering with his sleep.  Any motion of the shoulder causes pain.  No other joints or bones are causing difficulty.  He has made some minimal diet and exercise changes however his weight is actually gone up.  He is taking metformin for the diabetes and having no trouble with that.  He is also taking amlodipine, Coreg and losartan/HCTZ for his blood pressure.  Continues on Crestor for and having no difficulty with that.  He would like a refill on his Cialis.   Review of Systems     Objective:   Physical Exam Exam of the left shoulder does show a fullness near the acromion process laterally that is tender to palpation.  It is not warm.  Good motion of the shoulder passively and active motion does cause some discomfort.  No laxity noted. Hemoglobin A1c is 6.7.      Assessment & Plan:  Type 2 diabetes mellitus with complication, without long-term current use of insulin (HCC) - Plan: POCT glycosylated hemoglobin (Hb A1C)  Hypertension associated with diabetes ([place]  Morbid obesity (HCC)  Erectile dysfunction, unspecified erectile dysfunction type - Plan: tadalafil (CIALIS) 20 MG tablet  Need for influenza vaccination - Plan: Flu Vaccine QUAD 6+ mos PF IM (Fluarix Quad PF)  Immunization, viral disease - Plan: Moderna Covid-19 Vaccine Bivalent Booster  Left shoulder pain, unspecified chronicity - Plan: DG Shoulder Left Cialis was renewed.  Reinforced the need for him to continue to make diet and exercise changes and congratulated him on lowering his hemoglobin A1c.  His immunizations were updated. Left shoulder x-ray will be done.  I do have concern over exactly with this fullness in the left shoulder is.  Did  recommend Tylenol 2 tablets 4 times daily as well as 2 Aleve twice per day to help with the pain until we get the x-ray back.

## 2021-03-23 ENCOUNTER — Other Ambulatory Visit: Payer: Self-pay

## 2021-03-23 DIAGNOSIS — M25512 Pain in left shoulder: Secondary | ICD-10-CM

## 2021-03-25 LAB — HM DIABETES EYE EXAM

## 2021-03-31 ENCOUNTER — Other Ambulatory Visit: Payer: Self-pay | Admitting: Family Medicine

## 2021-03-31 DIAGNOSIS — I152 Hypertension secondary to endocrine disorders: Secondary | ICD-10-CM

## 2021-04-14 ENCOUNTER — Ambulatory Visit (INDEPENDENT_AMBULATORY_CARE_PROVIDER_SITE_OTHER): Payer: Managed Care, Other (non HMO) | Admitting: Orthopedic Surgery

## 2021-04-14 ENCOUNTER — Other Ambulatory Visit: Payer: Self-pay

## 2021-04-14 VITALS — Ht 77.0 in | Wt >= 6400 oz

## 2021-04-14 DIAGNOSIS — M25512 Pain in left shoulder: Secondary | ICD-10-CM

## 2021-04-15 ENCOUNTER — Encounter: Payer: Self-pay | Admitting: Orthopedic Surgery

## 2021-04-15 NOTE — Progress Notes (Addendum)
Office Visit Note   Patient: Guy Taylor           Date of Birth: 04/08/1971           MRN: [id number] Visit Date: 04/14/2021 Requested by: Denita Lung, MD [address] [place],   [address] PCP: Denita Lung, MD  Subjective: Chief Complaint  Patient presents with   Left Shoulder - Pain    HPI: Guy Taylor is a 50 year old patient with left shoulder pain of several months duration.  Has pain every day.  He states that his left shoulder has a "knot on top of it".  He has pain most of the time but his symptoms are worse at night.  Wakes him from sleep at night.  Has some occasional radiation down the arm but not below the elbow.  Denies any numbness and tingling or neck pain.  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  Denies any weakness.  Radiographs from October demonstrate no fracture or dislocation with no superior migration of the humeral head or other focal bone abnormality.  Soft tissues unremarkable.  Mild degenerative changes noted in the [facility] joint.              ROS: All systems reviewed are negative as they relate to the chief complaint within the history of present illness.  Patient denies  fevers or chills.   Assessment & Plan: Visit Diagnoses:  1. Left shoulder pain, unspecified chronicity     Plan: Impression is likely AC joint mediated pain.  Examined the [place] today with ultrasound but it was hard to penetrate to the depth necessary for a clearer look but it does not appear that he has a large cystic structure present but there is some swelling around the [facility] joint.  The rest of his rotator cuff and labral exam is unremarkable.  Based on duration of symptoms and presence of asymmetry at the superior aspect of the [facility] joint I think MRI scan is indicated to evaluate that [facility] joint arthritis and possible occult cyst.  Follow-up after that study.  Radiographs unremarkable in terms of  finding definite bony  pathology.  Follow-Up Instructions: No follow-ups on file.   Orders:  Orders Placed This Encounter  Procedures   MR Shoulder Left w/o contrast   No orders of the defined types were placed in this encounter.     Procedures: No procedures performed   Clinical Data: No additional findings.  Objective: Vital Signs: Ht 6' 5"  (1.956 m)   Wt (!) 407 lb (184.6 kg)   BMI 48.26 kg/m   Physical Exam:   Constitutional: Patient appears well-developed HEENT:  Head: Normocephalic Eyes:EOM are normal Neck: Normal range of motion Cardiovascular: Normal rate Pulmonary/chest: Effort normal Neurologic: Patient is alert Skin: Skin is warm Psychiatric: Patient has normal mood and affect   Ortho Exam: Ortho exam demonstrates full active and passive range of motion of the left shoulder.  Has definitely discrete tenderness over the [facility] joint on the left compared to the right.  Nonfocal swelling is present around the [facility] joint but no fluctuance or erythema or warmth.  Rotator cuff strength intact infraspinatus supraspinatus subscap testing on the left-hand side.  There is some coarseness with passive range of motion but it localizes to the [facility] joint.  Motor or sensory function to the hand is intact on the left.  No other masses lymphadenopathy or skin changes noted in that shoulder  girdle region.  Patient has symmetric external rotation at 15 degrees of abduction to about 60 degrees.  Specialty Comments:  No specialty comments available.  Imaging: No results found.   PMFS History: Patient Active Problem List   Diagnosis Date Noted   Bilateral testicular atrophy 04/28/2016   Renal stone 03/03/2016   Other male erectile dysfunction 12/11/2014   Type 2 diabetes mellitus with complication, without long-term current use of insulin ([place]) 07/26/2011   Hypertension associated with diabetes ([place]) 07/26/2011   Morbid obesity ([place]) 07/26/2011   Hyperlipidemia associated with type 2 diabetes  mellitus ([place]) 07/26/2011   Hypogonadism male 07/26/2011   Allergic rhinitis, seasonal 07/26/2011   Past Medical History:  Diagnosis Date   Allergic rhinitis    Asthma    Diabetes mellitus    Hypertension    Hypogonadism male    Meniscus tear    rt knee   Obesity    Renal disorder    Stones in the urinary tract     Family History  Problem Relation Age of Onset   Diabetes Mother     Past Surgical History:  Procedure Laterality Date   ESOPHAGOGASTRODUODENOSCOPY N/A 11/16/2014   Procedure: ESOPHAGOGASTRODUODENOSCOPY (EGD);  Surgeon: Laurence Spates, MD;  Location: [facility] ENDOSCOPY;  Service: Endoscopy;  Laterality: N/A;   KNEE ARTHROSCOPY  02/13/2012   Procedure: ARTHROSCOPY KNEE;  Surgeon: Alta Corning, MD;  Location: [place];  Service: Orthopedics;  Laterality: Right;   Social History   Occupational History   Not on file  Tobacco Use   Smoking status: Never   Smokeless tobacco: Never  Substance and Sexual Activity   Alcohol use: No   Drug use: No   Sexual activity: Yes

## 2021-04-30 ENCOUNTER — Other Ambulatory Visit: Payer: Self-pay

## 2021-04-30 DIAGNOSIS — M25512 Pain in left shoulder: Secondary | ICD-10-CM

## 2021-05-26 ENCOUNTER — Other Ambulatory Visit: Payer: Self-pay

## 2021-05-26 ENCOUNTER — Encounter: Payer: Self-pay | Admitting: Physical Therapy

## 2021-05-26 ENCOUNTER — Ambulatory Visit (INDEPENDENT_AMBULATORY_CARE_PROVIDER_SITE_OTHER): Payer: Managed Care, Other (non HMO) | Admitting: Physical Therapy

## 2021-05-26 DIAGNOSIS — R293 Abnormal posture: Secondary | ICD-10-CM

## 2021-05-26 DIAGNOSIS — M25512 Pain in left shoulder: Secondary | ICD-10-CM

## 2021-05-26 DIAGNOSIS — M6281 Muscle weakness (generalized): Secondary | ICD-10-CM | POA: Diagnosis not present

## 2021-05-26 NOTE — Patient Instructions (Signed)
Access Code: [email or web address] URL: [email or web address] Date: 05/26/2021 Prepared by: Faustino Congress  Exercises Supine Shoulder Flexion with Free Weight - 2 x daily - 7 x weekly - 3 sets - 10 reps Sidelying Shoulder Abduction Palm Forward - 2 x daily - 7 x weekly - 3 sets - 10 reps Sidelying Shoulder External Rotation - 2 x daily - 7 x weekly - 3 sets - 10 reps Standing Shoulder Row with Anchored Resistance - 2 x daily - 7 x weekly - 3 sets - 10 reps Prone Shoulder Row - 1 x daily - 7 x weekly - 3 sets - 10 reps Prone Shoulder Horizontal Abduction with Thumbs Up - 1 x daily - 7 x weekly - 3 sets - 10 reps Prone Shoulder Extension - Single Arm - 1 x daily - 7 x weekly - 3 sets - 10 reps

## 2021-05-26 NOTE — Therapy (Signed)
[facility] Physical Therapy [address] [place], [place], [address] Phone: [phone]   Fax:  [phone]  Physical Therapy Evaluation  Patient Details  Name: Guy Taylor MRN: [id number] Date of Birth: 09-23-1970 Referring Provider (PT): Marlou Sa Tonna Corner, MD   Encounter Date: 05/26/2021   PT End of Session - 05/26/21 0920     Visit Number 1    Date for PT Re-Evaluation 07/07/21    PT Start Time 0845    PT Stop Time 0915    PT Time Calculation (min) 30 min    Activity Tolerance Patient tolerated treatment well    Behavior During Therapy [facility] for tasks assessed/performed             Past Medical History:  Diagnosis Date   Allergic rhinitis    Asthma    Diabetes mellitus    Hypertension    Hypogonadism male    Meniscus tear    rt knee   Obesity    Renal disorder    Stones in the urinary tract     Past Surgical History:  Procedure Laterality Date   ESOPHAGOGASTRODUODENOSCOPY N/A 11/16/2014   Procedure: ESOPHAGOGASTRODUODENOSCOPY (EGD);  Surgeon: Laurence Spates, MD;  Location: [facility] ENDOSCOPY;  Service: Endoscopy;  Laterality: N/A;   KNEE ARTHROSCOPY  02/13/2012   Procedure: ARTHROSCOPY KNEE;  Surgeon: Alta Corning, MD;  Location: [place];  Service: Orthopedics;  Laterality: Right;    There were no vitals filed for this visit.    Subjective Assessment - 05/26/21 0847     Subjective Pt is a 51 y/o male who presents to OPPT for Lt shoulder pain which he c/o severe and also has a "knot" on top of the shoulder.  MD recommended MRI which was denied due to not having 6 weeks of guided exercise.  He does report pain is less severe but still painful.  Pain initially began about 4-5 months ago without known injury.    Pertinent History asthma, DM, obesity, HTN    Limitations Lifting    Patient Stated Goals improve mobility and pain    Currently in Pain? Yes    Pain Score 0-No pain   up to 4/10   Pain Location Shoulder    Pain Orientation Left     Pain Descriptors / Indicators Sharp;Constant    Pain Type Chronic pain;Acute pain    Pain Onset More than a month ago    Pain Frequency Intermittent    Aggravating Factors  any overhead lifting, bench press    Pain Relieving Factors avoiding provoking exercises                [facility] PT Assessment - 05/26/21 0842       Assessment   Medical Diagnosis M25.512 (ICD-10-CM) - Left shoulder pain, unspecified chronicity    Referring Provider (PT) Marlou Sa Tonna Corner, MD    Onset Date/Surgical Date --   4-5 months ago   Hand Dominance Right    Next MD Visit not yet scheduled    Prior Therapy none      Precautions   Precautions None      Restrictions   Weight Bearing Restrictions No      Balance Screen   Has the patient fallen in the past 6 months No    Has the patient had a decrease in activity level because of a fear of falling?  No    Is the patient reluctant to leave their home because of a fear of falling?  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)    Leisure weightlifting      Cognition   Overall Cognitive Status Within Functional Limits for tasks assessed      Posture/Postural Control   Posture/Postural Control Postural limitations    Postural Limitations Rounded Shoulders;Forward head      ROM / Strength   AROM / PROM / Strength AROM;Strength      AROM   Overall AROM Comments Lt shoulder WNL except pain with flexion, abduction and external rotation    AROM Assessment Site Shoulder      Strength   Overall Strength Comments pain with bil testing - all motions except IR    Strength Assessment Site Shoulder    Right/Left Shoulder Right;Left    Right Shoulder Flexion  4+/5    Right Shoulder ABduction 4+/5    Right Shoulder Internal Rotation 5/5    Right Shoulder External Rotation 4+/5    Left Shoulder Flexion 3+/5    Left Shoulder ABduction 4/5    Left Shoulder Internal Rotation 5/5    Left Shoulder External Rotation 4/5      Special Tests    Special Tests Rotator Cuff Impingement    Rotator Cuff Impingment tests Empty Can test;Full Can test;Hawkins- Kennedy test      Hawkins-Kennedy test   Findings Negative      Empty Can test   Findings Negative      Full Can test   Findings Negative                        Objective measurements completed on examination: See above findings.       [facility] Adult PT Treatment/Exercise - 05/26/21 0842       Exercises   Exercises Other Exercises    Other Exercises  see pt instructions-instructed with trial reps to ensure understanding                     PT Education - 05/26/21 0919     Education Details HEP, avoiding overhead lifting/activity for now as pain elevated with this activitiy.  gradual incorporation of overhead activity as pain dictates/allows    Person(s) Educated Patient    Methods Explanation;Demonstration;Handout    Comprehension Verbalized understanding;Returned demonstration                 PT Long Term Goals - 05/26/21 0920       PT LONG TERM GOAL #1   Title to be determined if pt returns                    Plan - 05/26/21 0920     Clinical Impression Statement Pt is a 51 y/o male who presents to OPPT for subacute Lt shoulder pain x 4-5 months wihtout known injury.  He demonstrates pain with motion and decreased strength (likely due to pain), as well as postural abnormalities affecting functional mobility.  Pt would like to work on HEP at home and will call if he needs to be seen.    Personal Factors and Comorbidities Comorbidity 3+    Comorbidities asthma, DM, obesity, HTN  Examination-Activity Limitations Reach  Overhead;Lift;Carry    Examination-Participation Restrictions Cleaning;Meal Prep;Occupation    Stability/Clinical Decision Making Evolving/Moderate complexity    Clinical Decision Making Moderate    Rehab Potential Good    PT Frequency 1x / week   will see up to 1x/wk if needed   PT Duration 6 weeks    PT Treatment/Interventions ADLs/Self Care Home Management;Cryotherapy;Electrical Stimulation;Moist Heat;Therapeutic activities;Therapeutic exercise;Neuromuscular re-education;Patient/family education;Manual techniques;Passive range of motion;Taping    PT Next Visit Plan if pt returns review/update HEP, manual/modalities PRN    PT Home Exercise Plan Access Code: [email or web address]    Consulted and Agree with Plan of Care Patient             Patient will benefit from skilled therapeutic intervention in order to improve the following deficits and impairments:  Pain, Postural dysfunction, Decreased strength, Impaired UE functional use  Visit Diagnosis: Left shoulder pain, unspecified chronicity - Plan: PT plan of care cert/re-cert  Abnormal posture - Plan: PT plan of care cert/re-cert  Muscle weakness (generalized) - Plan: PT plan of care cert/re-cert     Problem List Patient Active Problem List   Diagnosis Date Noted   Bilateral testicular atrophy 04/28/2016   Renal stone 03/03/2016   Other male erectile dysfunction 12/11/2014   Type 2 diabetes mellitus with complication, without long-term current use of insulin ([place]) 07/26/2011   Hypertension associated with diabetes ([place]) 07/26/2011   Morbid obesity ([place]) 07/26/2011   Hyperlipidemia associated with type 2 diabetes mellitus ([place]) 07/26/2011   Hypogonadism male 07/26/2011   Allergic rhinitis, seasonal 07/26/2011    Faustino Congress, PT, DPT 05/26/2021, 9:25 AM  [facility] Physical Therapy [address] [place], [place], [address] Phone: [phone]   Fax:  [phone]  Name: Guy Taylor MRN:  [id number] Date of Birth: 15-Jan-1971

## 2021-06-03 ENCOUNTER — Other Ambulatory Visit: Payer: Self-pay | Admitting: Family Medicine

## 2021-06-03 DIAGNOSIS — N529 Male erectile dysfunction, unspecified: Secondary | ICD-10-CM

## 2021-06-03 NOTE — Telephone Encounter (Signed)
[facility] is requesting to fill pt tadalafil. Please advise [facility]

## 2021-07-07 ENCOUNTER — Encounter: Payer: Self-pay | Admitting: Orthopedic Surgery

## 2021-07-07 ENCOUNTER — Ambulatory Visit (INDEPENDENT_AMBULATORY_CARE_PROVIDER_SITE_OTHER): Payer: Managed Care, Other (non HMO) | Admitting: Surgical

## 2021-07-07 ENCOUNTER — Other Ambulatory Visit: Payer: Self-pay

## 2021-07-07 DIAGNOSIS — M25512 Pain in left shoulder: Secondary | ICD-10-CM

## 2021-07-07 NOTE — Progress Notes (Signed)
Office Visit Note   Patient: Guy Taylor           Date of Birth: 04/05/1971           MRN: [id number] Visit Date: 07/07/2021 Requested by: Denita Lung, MD [address] [place],  [place] [address] PCP: Denita Lung, MD  Subjective: Chief Complaint  Patient presents with   Left Shoulder - Follow-up    HPI: Guy Taylor is a 51 y.o. male who presents to the office complaining of left shoulder pain.  Patient is here for follow-up visit following last appointment seen Dr. Marlou Sa for left shoulder pain in 2022.  He states that he is overall improved compared with the last visit.  He no longer has pain at rest or at night though he occasionally wakes with pain but this is very rare for him.  He has continued to notice pain mostly with lifting and he has tried to return to the gym but it was giving him a lot of pain so he just switched to resistance bands which she received from physical therapy.  He feels about 60% better than at his last appointment though he does notice some occasional pain in his right shoulder now that he did not have.  He is no longer taking any medications for pain anymore.  Denies any numbness or tingling or radicular pain in the arms.  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                ROS: All systems reviewed are negative as they relate to the chief complaint within the history of present illness.  Patient denies fevers or chills.  Assessment & Plan: Visit Diagnoses:  1. Left shoulder pain, unspecified chronicity     Plan: Patient is a 51 year old male who presents for repeat evaluation of left shoulder pain.  He continues to complain of acromioclavicular joint pain that is worse with lifting and with crossarm adduction on exam today.  He has moderate tenderness over the left [facility] joint that is not present on the right.   With continued symptoms though they are improved, plan to obtain the MRI of the left shoulder that was planned at the last visit and never obtained.  Based on this scan, we can discuss injection in the [facility] joint.  Currently it is not bothering him enough for surgical intervention but with continued symptoms over the last several months that have not resolved, think that it would be good to get the scan to find out what the cause of his shoulder pain is.  Follow-up after MRI to review results.  Follow-Up Instructions: Return for Review CT/MRI scan.   Orders:  Orders Placed This Encounter  Procedures   MR Shoulder Left w/o contrast   No orders of the defined types were placed in this encounter.     Procedures: No procedures performed   Clinical Data: No additional findings.  Objective: Vital Signs: There were no vitals taken for this visit.  Physical Exam:  Constitutional: Patient appears well-developed HEENT:  Head: Normocephalic Eyes:EOM are normal Neck: Normal range of motion Cardiovascular: Normal rate Pulmonary/chest: Effort normal Neurologic: Patient is alert Skin: Skin is warm Psychiatric: Patient has normal mood and affect  Ortho Exam: Ortho exam demonstrates left shoulder with 35 degrees external [place], 95  degrees abduction, 180 degrees forward flexion.  This compared with the right shoulder with 40 degrees external rotation, 95 degrees abduction, 180 degrees forward flexion.  Left AC joint is moderately tender to palpation with no tenderness to palpation of the right AC joint.  Moderate tenderness to palpation of the bicipital groove on the left shoulder with no tenderness over the right bicipital groove.  Excellent rotator cuff strength of supra, infra, subscap of both shoulders with no weakness noted.  No coarse grinding or crepitus noted.  Axillary nerve is intact with deltoid firing.  5/5 motor strength of bilateral grip strength, finger abduction, pronation/supination,  bicep, tricep, deltoid.  Specialty Comments:  No specialty comments available.  Imaging: No results found.   PMFS History: Patient Active Problem List   Diagnosis Date Noted   Bilateral testicular atrophy 04/28/2016   Renal stone 03/03/2016   Other male erectile dysfunction 12/11/2014   Type 2 diabetes mellitus with complication, without long-term current use of insulin ([place]) 07/26/2011   Hypertension associated with diabetes ([place]) 07/26/2011   Morbid obesity ([place]) 07/26/2011   Hyperlipidemia associated with type 2 diabetes mellitus ([place]) 07/26/2011   Hypogonadism male 07/26/2011   Allergic rhinitis, seasonal 07/26/2011   Past Medical History:  Diagnosis Date   Allergic rhinitis    Asthma    Diabetes mellitus    Hypertension    Hypogonadism male    Meniscus tear    rt knee   Obesity    Renal disorder    Stones in the urinary tract     Family History  Problem Relation Age of Onset   Diabetes Mother     Past Surgical History:  Procedure Laterality Date   ESOPHAGOGASTRODUODENOSCOPY N/A 11/16/2014   Procedure: ESOPHAGOGASTRODUODENOSCOPY (EGD);  Surgeon: Laurence Spates, MD;  Location: [facility] ENDOSCOPY;  Service: Endoscopy;  Laterality: N/A;   KNEE ARTHROSCOPY  02/13/2012   Procedure: ARTHROSCOPY KNEE;  Surgeon: Alta Corning, MD;  Location: [place];  Service: Orthopedics;  Laterality: Right;   Social History   Occupational History   Not on file  Tobacco Use   Smoking status: Never   Smokeless tobacco: Never  Substance and Sexual Activity   Alcohol use: No   Drug use: No   Sexual activity: Yes

## 2021-08-05 ENCOUNTER — Other Ambulatory Visit: Payer: Managed Care, Other (non HMO)

## 2021-09-27 ENCOUNTER — Other Ambulatory Visit: Payer: Self-pay | Admitting: Family Medicine

## 2021-09-27 DIAGNOSIS — E1159 Type 2 diabetes mellitus with other circulatory complications: Secondary | ICD-10-CM

## 2021-12-27 ENCOUNTER — Other Ambulatory Visit: Payer: Self-pay | Admitting: Family Medicine

## 2021-12-27 DIAGNOSIS — I152 Hypertension secondary to endocrine disorders: Secondary | ICD-10-CM

## 2021-12-28 ENCOUNTER — Telehealth: Payer: Self-pay | Admitting: Family Medicine

## 2021-12-28 NOTE — Telephone Encounter (Signed)
Pt wife called and is wanting to know if pt needs a referral for a colonoscopy since pt turned 50 this year she thinks he needs to have on, In the pts note it states that he is not due until 2026, according to her he has never had one, I also looked and can not find a colonoscopy report in the system.  Pt wife can be reached at [phone]

## 2021-12-29 ENCOUNTER — Encounter (INDEPENDENT_AMBULATORY_CARE_PROVIDER_SITE_OTHER): Payer: Self-pay

## 2022-01-20 ENCOUNTER — Emergency Department (HOSPITAL_BASED_OUTPATIENT_CLINIC_OR_DEPARTMENT_OTHER): Payer: Managed Care, Other (non HMO)

## 2022-01-20 ENCOUNTER — Emergency Department (HOSPITAL_BASED_OUTPATIENT_CLINIC_OR_DEPARTMENT_OTHER): Payer: Managed Care, Other (non HMO) | Admitting: Radiology

## 2022-01-20 ENCOUNTER — Emergency Department (HOSPITAL_BASED_OUTPATIENT_CLINIC_OR_DEPARTMENT_OTHER)
Admission: EM | Admit: 2022-01-20 | Discharge: 2022-01-20 | Disposition: A | Discharge status: Home / Self Care | Payer: Managed Care, Other (non HMO) | Attending: Emergency Medicine | Admitting: Emergency Medicine

## 2022-01-20 ENCOUNTER — Other Ambulatory Visit: Payer: Self-pay

## 2022-01-20 ENCOUNTER — Telehealth: Payer: Self-pay

## 2022-01-20 ENCOUNTER — Encounter (HOSPITAL_BASED_OUTPATIENT_CLINIC_OR_DEPARTMENT_OTHER): Payer: Self-pay | Admitting: Emergency Medicine

## 2022-01-20 DIAGNOSIS — R079 Chest pain, unspecified: Secondary | ICD-10-CM | POA: Insufficient documentation

## 2022-01-20 LAB — BASIC METABOLIC PANEL
Anion gap: 9 (ref 5–15)
BUN: 17 mg/dL (ref 6–20)
CO2: 25 mmol/L (ref 22–32)
Calcium: 9.6 mg/dL (ref 8.9–10.3)
Chloride: 104 mmol/L (ref 98–111)
Creatinine, Ser: 1.06 mg/dL (ref 0.61–1.24)
GFR, Estimated: >60 mL/min (ref >60)
Glucose, Bld: 152 mg/dL — ABNORMAL HIGH (ref 70–99)
Potassium: 3.9 mmol/L (ref 3.5–5.1)
Sodium: 138 mmol/L (ref 135–145)

## 2022-01-20 LAB — CBC
HCT: 44.2 % (ref 39.0–52.0)
Hemoglobin: 14.6 g/dL (ref 13.0–17.0)
MCH: 27.4 pg (ref 26.0–34.0)
MCHC: 33 g/dL (ref 30.0–36.0)
MCV: 82.9 fL (ref 80.0–100.0)
Platelets: 234 10*3/uL (ref 150–400)
RBC: 5.33 MIL/uL (ref 4.22–5.81)
RDW: 13.2 % (ref 11.5–15.5)
WBC: 5.3 10*3/uL (ref 4.0–10.5)
nRBC: 0 % (ref 0.0–0.2)

## 2022-01-20 LAB — TROPONIN I (HIGH SENSITIVITY)
Troponin I (High Sensitivity): 10 ng/L (ref <18)
Troponin I (High Sensitivity): 12 ng/L (ref <18)

## 2022-01-20 MED ORDER — ALUM & MAG HYDROXIDE-SIMETH 200-200-20 MG/5ML PO SUSP
30.0000 mL | Freq: Once | ORAL | Status: AC
  Administered 2022-01-20: 30 mL via ORAL
  Filled 2022-01-20: qty 30

## 2022-01-20 MED ORDER — IOHEXOL 350 MG/ML SOLN
100.0000 mL | Freq: Once | INTRAVENOUS | Status: AC | PRN
  Administered 2022-01-20: 100 mL via INTRAVENOUS

## 2022-01-20 NOTE — ED Notes (Signed)
Patient transported to CT 

## 2022-01-20 NOTE — Telephone Encounter (Signed)
Pt. Wife called stating he was on his way home now because he started having chest pains and they are becoming more frequent. She wanted to take him to the ER since you were not here today and its late in the afternoon. I told her [facility] has a shorter wait time and could also try the new [facility] is less crowded.

## 2022-01-20 NOTE — ED Triage Notes (Signed)
Pt arrives to ED with c/o left sided chest pain that radiates to his back that started today.

## 2022-01-20 NOTE — ED Provider Notes (Signed)
[facility] EMERGENCY DEPT Provider Note   CSN: [id number] Arrival date & time: 01/20/22  1604     History  Chief Complaint  Patient presents with   Chest Pain    Guy Taylor is a 51 y.o. male.  HPI     Sitting at desk and suddenly had pain 230PM Sharp pain, every time it happened took breath away Then back started hurting Worse with deep breaths With leaning back it seemed like it got worse and had to lean forward Not otherwise feeling short of breath but pain taking breath No nausea, vomiting, numbness/weakness No abdominal pain, sweating  (chronic sweating) Now it comes and goes, not as intense as it was  Not worse with exertion No numbness/weakness No fam hx of early heart disease, no leg pain/swelling, no recent surgery/immobilization  Home Medications Prior to Admission medications   Medication Sig Start Date End Date Taking? Authorizing Provider  tadalafil (CIALIS) 20 MG tablet TAKE 1 TABLET DAILY AS NEEDED FOR ERECTILE DYSFUNCTION 06/03/21   Ronnald Nian, MD  Alcohol Swabs (ALCOHOL PREP) PADS 1 each by Does not apply route 2 (two) times daily.    [provider]  amLODipine (NORVASC) 10 MG tablet TAKE 1 TABLET DAILY 12/27/21   Ronnald Nian, MD  carvedilol (COREG) 25 MG tablet TAKE 1 TABLET TWICE A DAY WITH MEALS 12/27/21   Ronnald Nian, MD  glucose blood test strip Test twice a day. Pt uses a one touch ultra meter 08/14/15   Ronnald Nian, MD  losartan-hydrochlorothiazide [facility]) 100-12.5 MG tablet TAKE 1 TABLET DAILY 12/27/21   Ronnald Nian, MD  metFORMIN (GLUCOPHAGE) 500 MG tablet Take 1 tablet (500 mg total) by mouth 2 (two) times daily with a meal. 01/06/21   Ronnald Nian, MD  omeprazole (PRILOSEC) 40 MG capsule Take 1 capsule (40 mg total) by mouth daily. Patient not taking: Reported on 01/06/2021 06/19/20   Ronnald Nian, MD  pantoprazole (PROTONIX) 40 MG tablet Take 1 tablet (40 mg total) by mouth 2 (two) times daily  before a meal. Patient not taking: Reported on 01/06/2021 10/02/20   Particia Nearing, PA-C  rosuvastatin (CRESTOR) 40 MG tablet Take 1 tablet (40 mg total) by mouth daily. 03/10/20   Ronnald Nian, MD  sucralfate (CARAFATE) 1 g tablet Take 1 tablet (1 g total) by mouth 4 (four) times daily -  with meals and at bedtime. Patient not taking: Reported on 03/18/2021 10/02/20   Particia Nearing, PA-C  TRUEPLUS LANCETS 30G MISC USE AS DIRECTED TO TEST TWICE DAILY 02/25/16   Ronnald Nian, MD      Allergies    Lisinopril    Review of Systems   Review of Systems  Physical Exam Updated Vital Signs BP (!) 158/88   Pulse 69   Temp 97.8 F (36.6 C) (Oral)   Resp 20   Ht 6\' 6"  (1.981 m)   Wt (!) 193.7 kg   SpO2 97%   BMI 49.34 kg/m  Physical Exam Vitals and nursing note reviewed.  Constitutional:      General: He is not in acute distress.    Appearance: He is well-developed. He is not diaphoretic.  HENT:     Head: Normocephalic and atraumatic.  Eyes:     Conjunctiva/sclera: Conjunctivae normal.  Cardiovascular:     Rate and Rhythm: Normal rate and regular rhythm.     Heart sounds: Normal heart sounds. No murmur heard.  No friction rub. No gallop.  Pulmonary:     Effort: Pulmonary effort is normal. No respiratory distress.     Breath sounds: Normal breath sounds. No wheezing or rales.  Abdominal:     General: There is no distension.     Palpations: Abdomen is soft.     Tenderness: There is no abdominal tenderness. There is no guarding.  Musculoskeletal:     Cervical back: Normal range of motion.  Skin:    General: Skin is warm and dry.  Neurological:     Mental Status: He is alert and oriented to person, place, and time.     ED Results / Procedures / Treatments   Labs (all labs ordered are listed, but only abnormal results are displayed) Labs Reviewed  BASIC METABOLIC PANEL - Abnormal; Notable for the following components:      Result Value   Glucose, Bld  152 (*)    All other components within normal limits  CBC  TROPONIN I (HIGH SENSITIVITY)  TROPONIN I (HIGH SENSITIVITY)    EKG EKG Interpretation  Date/Time:  Thursday January 20 2022 16:13:01 EDT Ventricular Rate:  86 PR Interval:  198 QRS Duration: 100 QT Interval:  370 QTC Calculation: 442 R Axis:   7 Text Interpretation: Normal sinus rhythm Normal ECG When compared with ECG of 15-Nov-2014 21:18, T wave inversion no longer evident in Inferior leads Confirmed by Alvira Monday ([id number]) on 01/20/2022 4:48:20 PM  Radiology CT Angio Chest/Abd/Pel for Dissection W and/or Wo Contrast  Result Date: 01/20/2022 CLINICAL DATA:  Chest and back pain. EXAM: CT ANGIOGRAPHY CHEST, ABDOMEN AND PELVIS TECHNIQUE: Non-contrast CT of the chest was initially obtained. Multidetector CT imaging through the chest, abdomen and pelvis was performed using the standard protocol during bolus administration of intravenous contrast. Multiplanar reconstructed images and MIPs were obtained and reviewed to evaluate the vascular anatomy. RADIATION DOSE REDUCTION: This exam was performed according to the departmental dose-optimization program which includes automated exposure control, adjustment of the mA and/or kV according to patient size and/or use of iterative reconstruction technique. CONTRAST:  OMNIPAQUE IOHEXOL 350 MG/ML SOLN COMPARISON:  CT abdomen and pelvis 09/25/2015. FINDINGS: CTA CHEST FINDINGS Cardiovascular: Preferential opacification of the thoracic aorta. No evidence of thoracic aortic aneurysm or dissection. Heart is borderline enlarged. No pericardial effusion. Mediastinum/Nodes: No enlarged mediastinal, hilar, or axillary lymph nodes. Thyroid gland, trachea, and esophagus demonstrate no significant findings. Lungs/Pleura: Lungs are clear. No pleural effusion or pneumothorax. Musculoskeletal: No chest wall abnormality. No acute or significant osseous findings. Review of the MIP images confirms the above  findings. CTA ABDOMEN AND PELVIS FINDINGS VASCULAR Aorta: Normal caliber aorta without aneurysm, dissection, vasculitis or significant stenosis. Celiac: Patent without evidence of aneurysm, dissection, vasculitis or significant stenosis. SMA: Patent without evidence of aneurysm, dissection, vasculitis or significant stenosis. Renals: Both renal arteries are patent without evidence of aneurysm, dissection, vasculitis, fibromuscular dysplasia or significant stenosis. IMA: Patent without evidence of aneurysm, dissection, vasculitis or significant stenosis. Inflow: Patent without evidence of aneurysm, dissection, vasculitis or significant stenosis. Veins: No obvious venous abnormality within the limitations of this arterial phase study. Review of the MIP images confirms the above findings. NON-VASCULAR Hepatobiliary: No focal liver abnormality is seen. No gallstones, gallbladder wall thickening, or biliary dilatation. Pancreas: Unremarkable. No pancreatic ductal dilatation or surrounding inflammatory changes. Spleen: Normal in size without focal abnormality. Adrenals/Urinary Tract: Adrenal glands are unremarkable. Kidneys are normal, without renal calculi, focal lesion, or hydronephrosis. Bladder is unremarkable. Stomach/Bowel: Stomach is within  normal limits. Appendix appears normal. No evidence of bowel wall thickening, distention, or inflammatory changes. There is colonic diverticulosis. Lymphatic: No enlarged lymph nodes are seen. There are nonenlarged upper abdominal lymph nodes. Reproductive: Prostate is unremarkable. Other: Moderate-sized fat containing umbilical hernia.  No ascites. Musculoskeletal: No acute or significant osseous findings. Review of the MIP images confirms the above findings. IMPRESSION: 1. No evidence for aortic dissection or aneurysm. 2. No acute process in the chest, abdomen or pelvis. 3. Borderline cardiomegaly. 4. Colonic diverticulosis. 5. Moderate-sized fat containing umbilical hernia.  Electronically Signed   By: Darliss Cheney M.D.   On: 01/20/2022 18:17   DG Chest 2 View  Result Date: 01/20/2022 CLINICAL DATA:  Provided history: Chest pain. EXAM: CHEST - 2 VIEW COMPARISON:  Prior chest radiographs 11/15/2014 and earlier. FINDINGS: Heart size at the upper limits of normal. Central pulmonary vascular congestion without overt pulmonary edema. No evidence of airspace consolidation. No evidence of pleural effusion or pneumothorax. Degenerative changes of the spine. IMPRESSION: Central pulmonary vascular congestion without overt pulmonary edema. No appreciable airspace consolidation Electronically Signed   By: Jackey Loge D.O.   On: 01/20/2022 16:57    Procedures Procedures    Medications Ordered in ED Medications  iohexol (OMNIPAQUE) 350 MG/ML injection 100 mL (100 mLs Intravenous Contrast Given 01/20/22 1737)  alum & mag hydroxide-simeth (MAALOX/MYLANTA) 200-200-20 MG/5ML suspension 30 mL (30 mLs Oral Given 01/20/22 1947)    ED Course/ Medical Decision Making/ A&P                            50yo male with history of DM, htn, who presents with concern for chest pain.  Differential diagnosis for chest pain includes pulmonary embolus, dissection, pneumothorax, pneumonia, ACS, myocarditis, pericarditis.  EKG was done and evaluate by me and showed no acute ST changes and no signs of pericarditis. Chest x-ray was done and evaluated by me and radiology and showed no sign of pneumonia or pneumothorax, central pulmonary vascular congestion.  CTA dissection study without dissection, no pericardial effusion.  No hypoxia, asymmetric leg swelling, low suspicion for PE.  HEART score 3 and had delta troponins which were both negative.   Symptoms improved after GI cocktail.  Given referral to Cardiology given risk factors.  Discussed return precautions. Patient discharged in stable condition with understanding of reasons to return.           Final Clinical Impression(s) / ED  Diagnoses Final diagnoses:  Chest pain, unspecified type    Rx / DC Orders ED Discharge Orders          Ordered    Ambulatory referral to Cardiology        01/20/22 2010              Alvira Monday, MD 01/21/22 2221

## 2022-01-25 ENCOUNTER — Ambulatory Visit (INDEPENDENT_AMBULATORY_CARE_PROVIDER_SITE_OTHER): Payer: Managed Care, Other (non HMO) | Admitting: Family Medicine

## 2022-01-25 ENCOUNTER — Encounter: Payer: Self-pay | Admitting: Family Medicine

## 2022-01-25 ENCOUNTER — Telehealth: Payer: Self-pay | Admitting: Family Medicine

## 2022-01-25 VITALS — BP 142/92 | HR 74 | Temp 96.8°F | Wt >= 6400 oz

## 2022-01-25 DIAGNOSIS — I152 Hypertension secondary to endocrine disorders: Secondary | ICD-10-CM

## 2022-01-25 DIAGNOSIS — Z23 Encounter for immunization: Secondary | ICD-10-CM | POA: Diagnosis not present

## 2022-01-25 DIAGNOSIS — E1159 Type 2 diabetes mellitus with other circulatory complications: Secondary | ICD-10-CM | POA: Diagnosis not present

## 2022-01-25 DIAGNOSIS — E1169 Type 2 diabetes mellitus with other specified complication: Secondary | ICD-10-CM

## 2022-01-25 DIAGNOSIS — E785 Hyperlipidemia, unspecified: Secondary | ICD-10-CM

## 2022-01-25 DIAGNOSIS — E118 Type 2 diabetes mellitus with unspecified complications: Secondary | ICD-10-CM | POA: Diagnosis not present

## 2022-01-25 LAB — POCT GLYCOSYLATED HEMOGLOBIN (HGB A1C): Hemoglobin A1C: 10.1 % — AB (ref 4.0–5.6)

## 2022-01-25 MED ORDER — TRULICITY 0.75 MG/0.5ML ~~LOC~~ SOAJ: 0.7500 mg | SUBCUTANEOUS | 0 refills | Status: DC

## 2022-01-25 NOTE — Telephone Encounter (Signed)
Error

## 2022-01-25 NOTE — Progress Notes (Signed)
Subjective:    Patient ID: Guy Taylor, male    DOB: 1970-11-12, 51 y.o.   MRN: [id number]  Guy Taylor is a 51 y.o. male who presents for follow-up of Type 2 diabetes mellitus.  Patient is checking home blood sugars.   Home blood sugar records: BGs have been labile ranging between 234 and 341 How often is blood sugars being checked: fasting in the am Current symptoms/problems include hyperglycemia 341 and polyuria and have been unchanged. Daily foot checks: yes   Any foot concerns: none  Last eye exam: Dr. Christeen Douglas Exercise: The patient has a physically strenuous job, but has no regular exercise apart from work.  He admits to not being compliant with his diet and exercise.  He states he has been doing a terrible job.  He recently was seen in the emergency room for chest pain.  That record was reviewed including notes and lab data.  His troponin levels were negative.  He did respond to GI medication but they did think it was needed for him to see cardiology.  It did show evidence of cardiomegaly.  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  He continues on metformin.  He continues on amlodipine, Coreg and losartan/HCTZ.  He is also taking Prilosec.  The following portions of the patient's history were reviewed and updated as appropriate: allergies, current medications, past medical history, past social history and problem list.  ROS as in subjective above.     Objective:    Physical Exam Alert and in no distress otherwise not examined. The emergency room record was reviewed including lab work and recommendations. hemoglobin A1c is 10.1   Lab Review    Latest Ref Rng & Units 01/20/2022    4:18 PM 03/18/2021    5:06 PM 01/06/2021   12:00 AM 10/02/2020    6:04 PM 07/14/2020    3:57 PM  Diabetic Labs  HbA1c 4.0 - 5.6 %  6.7  7.5   6.9   Creatinine 0.61 - 1.24 mg/dL 7.82    9.56        07/06/863    8:15 PM 01/20/2022    7:20 PM 01/20/2022    4:30 PM 01/20/2022    4:18  PM 01/20/2022    4:10 PM  BP/Weight  Systolic BP 158 162 172 195   Diastolic BP 88 93 87 112   Wt. (Lbs)     427  BMI     49.34 kg/m2      Latest Ref Rng & Units 03/25/2021   12:00 AM 03/24/2020   12:00 AM  Foot/eye exam completion dates  Eye Exam No Retinopathy No Retinopathy     No Retinopathy         This result is from an external source.    [place]  reports that he has never smoked. He has never used smokeless tobacco. He reports that he does not drink alcohol and does not use drugs.     Assessment & Plan:    Type 2 diabetes mellitus with complication, without long-term current use of insulin (HCC) - Plan: POCT glycosylated hemoglobin (Hb A1C), Dulaglutide (TRULICITY) 0.75 MG/0.5ML SOPN, Ambulatory referral to Cardiology  Hypertension associated with diabetes (HCC) - Plan: Ambulatory referral to Cardiology  Morbid obesity (HCC)  Need for influenza vaccination - Plan: Flu Vaccine QUAD 37mo+IM (Fluarix, Fluzone & Alfiuria Quad PF)  Hyperlipidemia associated with type 2 diabetes mellitus (HCC) I had a long discussion with  him concerning diet exercise, medications including medications.  I will add Trulicity  Discussed possible side effects of this medication.  He is to check back with me in 1 month to let me know how he is doing and I will see him in my office in roughly 4 months or sooner if needed.  Also refer to cardiology for evaluation of risk factors     Sharlot Gowda, MD

## 2022-02-02 ENCOUNTER — Encounter: Payer: Self-pay | Admitting: Family Medicine

## 2022-02-05 ENCOUNTER — Telehealth: Payer: Self-pay | Admitting: Family Medicine

## 2022-02-05 NOTE — Telephone Encounter (Signed)
P.A. TRULICITY 

## 2022-02-10 ENCOUNTER — Encounter: Payer: Self-pay | Admitting: Cardiovascular Disease

## 2022-02-10 NOTE — Progress Notes (Signed)
Cardiology Office Note:    Date:  02/11/2022   ID:  [name], DOB October 22, 1970, MRN [id number]  PCP:  Ronnald Nian, MD   [facility] Providers Cardiologist:  Delia Sitar    Referring MD: Alvira Monday, MD   Chief Complaint  Patient presents with   Chest Pain   Hypertension         History of Present Illness:    Guy Taylor is a 51 y.o. male with a hx of HTN,  DM , HLD ,chest pain , DM, CKD  Seen with Elmarie Shiley, wife  4 weeks ago. Developed CP while sitting at his desk while twisting / turning his torso.  Turned into a spasm like pain  Pain lasted for several hours Radiated through to his back   Went to the emergency room.  CT angiogram of the chest was negative for aneurysm, dissection.   Troponins were negative .  Had GI cocktail - symptoms resolved.   Has not had any chest pain orsince  Has been active at work Has been increaseing his cardio exercise   .Works for [employer] with  Family hx  - for CAD   He is on rosuvastatin for hyperlipidemia.  His last labs were drawn in October, 2021.  Total cholesterol was 200 HDL was 66 LDL is 124 Triglyceride level is 57 Hemoglobin A1c is 10.1.  Has gained 100 lbs over the past several years .    No smoker    Grew up [place] is 423 lbs. Body mass index is 48.95 kg/m.   Past Medical History:  Diagnosis Date   Allergic rhinitis    Asthma    Diabetes mellitus    Hypertension    Hypogonadism male    Meniscus tear    rt knee   Obesity    Renal disorder    Stones in the urinary tract     Past Surgical History:  Procedure Laterality Date   ESOPHAGOGASTRODUODENOSCOPY N/A 11/16/2014   Procedure: ESOPHAGOGASTRODUODENOSCOPY (EGD);  Surgeon: Carman Ching, MD;  [facility] END[facility];  Service: Endoscopy;  Laterality: N/A;   KNEE ARTHROSCOPY  02/13/2012   Procedure: ARTHROSCOPY KNEE;  Surgeon: Harvie Junior, MD;  Location: [facility] OR;  Service: Orthopedics;   Laterality: Right;    Current Medications: Current Meds  Medication Sig   Alcohol Swabs (ALCOHOL PREP) PADS 1 each by Does not apply route 2 (two) times daily.   amLODipine (NORVASC) 10 MG tablet TAKE 1 TABLET DAILY   carvedilol (COREG) 25 MG tablet TAKE 1 TABLET TWICE A DAY WITH MEALS   Dulaglutide (TRULICITY) 0.75 MG/0.5ML SOPN Inject 0.75 mg into the skin once a week.   glucose blood test strip Test twice a day. Pt uses a one touch ultra meter   losartan-hydrochlorothiazide (HYZAAR) 100-12.5 MG tablet TAKE 1 TABLET DAILY   omeprazole (PRILOSEC) 40 MG capsule Take 1 capsule (40 mg total) by mouth daily.   rosuvastatin (CRESTOR) 40 MG tablet Take 1 tablet (40 mg total) by mouth daily.   tadalafil (CIALIS) 20 MG tablet TAKE 1 TABLET DAILY AS NEEDED FOR ERECTILE DYSFUNCTION   TRUEPLUS LANCETS 30G MISC USE AS DIRECTED TO TEST TWICE DAILY     Allergies:   Lisinopril   Social History   Socioeconomic History   Marital status: Married    Spouse name: Not on file   Number of children: Not on file   Years of education:  Not on file   Highest education level: Not on file  Occupational History   Not on file  Tobacco Use   Smoking status: Never   Smokeless tobacco: Never  Substance and Sexual Activity   Alcohol use: No   Drug use: No   Sexual activity: Yes  Other Topics Concern   Not on file  Social History Narrative   Works for [employer], Games developer.   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   Social Determinants of Health   Financial Resource Strain: Not on file  Food Insecurity: Not on file  Transportation Needs: Not on file  Physical Activity: Not on file  Stress: Not on file  Social Connections: Not on file     Family History: The patient's family history includes Diabetes in his mother.  ROS:   Please see the history of present illness.     All other systems reviewed and are negative.  EKGs/Labs/Other Studies Reviewed:    The following studies  were reviewed today:   EKG:  8/31/ 23  NSR at 86  Recent Labs: 01/20/2022: BUN 17; Creatinine, Ser 1.06; Hemoglobin 14.6; Platelets 234; Potassium 3.9; Sodium 138  Recent Lipid Panel    Component Value Date/Time   CHOL 200 (H) 03/09/2020 0950   TRIG 57 03/09/2020 0950   HDL 66 03/09/2020 0950   CHOLHDL 3.0 03/09/2020 0950   CHOLHDL 3.3 05/18/2017 1603   VLDL 10 04/28/2016 1454   LDLCALC 124 (H) 03/09/2020 0950   LDLCALC 138 (H) 05/18/2017 1603     Risk Assessment/Calculations:                Physical Exam:    VS:  BP 135/85   Pulse 78   Ht 6\' 6"  (1.981 m)   Wt (!) 423 lb 9.6 oz (192.1 kg)   SpO2 95%   BMI 48.95 kg/m     Wt Readings from Last 3 Encounters:  02/11/22 (!) 423 lb 9.6 oz (192.1 kg)  01/25/22 (!) 427 lb 3.2 oz (193.8 kg)  01/20/22 (!) 427 lb (193.7 kg)     GEN:  Well nourished, well developed in no acute distress HEENT: Normal NECK: No JVD; No carotid bruits LYMPHATICS: No lymphadenopathy CARDIAC: RRR, no murmurs, rubs, gallops RESPIRATORY:  Clear to auscultation without rales, wheezing or rhonchi  ABDOMEN: Soft, non-tender, non-distended MUSCULOSKELETAL:  No edema; No deformity  SKIN: Warm and dry NEUROLOGIC:  Alert and oriented x 3 PSYCHIATRIC:  Normal affect   ASSESSMENT:    1. Morbid obesity (HCC)   2. Non-cardiac chest pain    PLAN:      Chest pain:-None cardiac chest pain.  EKG and troponins were negative.  The episode occurred while he was twisting and turning.  He had a CT angiogram of the aorta and there is no evidence of dissection or aneurysm.   Since that time he has been exercising and has not had any recurrent episodes of chest discomfort.  There is no family history of premature coronary artery disease.  2.  Obesity: We discussed the absolute necessity that he work on weight loss.  He is gained 100 pounds this past year.  We will enroll him in the [facility] weight loss program.  I will have him see 01/22/22, NP  for follow-up of his chest pain and his obesity.             Medication Adjustments/Labs and Tests Ordered: Current medicines are reviewed at length with  the patient today.  Concerns regarding medicines are outlined above.  Orders Placed This Encounter  Procedures   Amb Ref to Medical Weight Management   No orders of the defined types were placed in this encounter.   Patient Instructions  Medication Instructions:  Your physician recommends that you continue on your current medications as directed. Please refer to the Current Medication list given to you today.  *If you need a refill on your cardiac medications before your next appointment, please call your pharmacy*   Lab Work: NONE If you have labs (blood work) drawn today and your tests are completely normal, you will receive your results only by: [place] (if you have MyChart) OR A paper copy in the mail If you have any lab test that is abnormal or we need to change your treatment, we will call you to review the results.   Testing/Procedures: Referral to [facility]   Follow-Up: At [facility], you and your health needs are our priority.  As part of our continuing mission to provide you with exceptional heart care, we have created designated Provider Care Teams.  These Care Teams include your primary Cardiologist (physician) and Advanced Practice Providers (APPs -  Physician Assistants and Nurse Practitioners) who all work together to provide you with the care you need, when you need it.  Your next appointment:   3 month(s)  The format for your next appointment:   In Person  Provider:   Christen Bame, NP      Important Information About Sugar         Signed, Mertie Moores, MD  02/11/2022 7:41 PM    [place]

## 2022-02-11 ENCOUNTER — Ambulatory Visit: Payer: Managed Care, Other (non HMO) | Attending: Cardiovascular Disease | Admitting: Cardiovascular Disease

## 2022-02-11 ENCOUNTER — Encounter: Payer: Self-pay | Admitting: Cardiovascular Disease

## 2022-02-11 ENCOUNTER — Other Ambulatory Visit: Payer: Self-pay | Admitting: Family Medicine

## 2022-02-11 DIAGNOSIS — R0789 Other chest pain: Secondary | ICD-10-CM | POA: Diagnosis not present

## 2022-02-11 DIAGNOSIS — E118 Type 2 diabetes mellitus with unspecified complications: Secondary | ICD-10-CM

## 2022-02-11 NOTE — Patient Instructions (Signed)
Medication Instructions:  Your physician recommends that you continue on your current medications as directed. Please refer to the Current Medication list given to you today.  *If you need a refill on your cardiac medications before your next appointment, please call your pharmacy*   Lab Work: NONE If you have labs (blood work) drawn today and your tests are completely normal, you will receive your results only by: [place] (if you have MyChart) OR A paper copy in the mail If you have any lab test that is abnormal or we need to change your treatment, we will call you to review the results.   Testing/Procedures: Referral to [facility]   Follow-Up: At [facility], you and your health needs are our priority.  As part of our continuing mission to provide you with exceptional heart care, we have created designated Provider Care Teams.  These Care Teams include your primary Cardiologist (physician) and Advanced Practice Providers (APPs -  Physician Assistants and Nurse Practitioners) who all work together to provide you with the care you need, when you need it.  Your next appointment:   3 month(s)  The format for your next appointment:   In Person  Provider:   Christen Bame, NP      Important Information About Sugar

## 2022-02-19 NOTE — Telephone Encounter (Signed)
P.A. approved til 01/28/23, sent mychart message

## 2022-03-10 ENCOUNTER — Ambulatory Visit: Payer: Managed Care, Other (non HMO) | Admitting: Bariatrics

## 2022-03-14 ENCOUNTER — Encounter: Payer: Self-pay | Admitting: Internal Medicine

## 2022-04-16 ENCOUNTER — Other Ambulatory Visit: Payer: Self-pay | Admitting: Family Medicine

## 2022-04-16 DIAGNOSIS — E118 Type 2 diabetes mellitus with unspecified complications: Secondary | ICD-10-CM

## 2022-04-18 ENCOUNTER — Telehealth: Payer: Self-pay | Admitting: Family Medicine

## 2022-04-18 ENCOUNTER — Encounter: Payer: Self-pay | Admitting: Family Medicine

## 2022-04-18 ENCOUNTER — Ambulatory Visit: Payer: Managed Care, Other (non HMO) | Admitting: Family Medicine

## 2022-04-18 VITALS — BP 128/82 | HR 68 | Temp 98.6°F | Wt 396.2 lb

## 2022-04-18 DIAGNOSIS — R1012 Left upper quadrant pain: Secondary | ICD-10-CM | POA: Diagnosis not present

## 2022-04-18 MED ORDER — OMEPRAZOLE 40 MG PO CPDR: 40.0000 mg | DELAYED_RELEASE_CAPSULE | Freq: Every day | ORAL | 1 refills | Status: DC

## 2022-04-18 NOTE — Progress Notes (Signed)
   Subjective:    Patient ID: Guy Taylor, male    DOB: 1971-04-10, 51 y.o.   MRN: [id number]  HPI He complains of a 3-week history of left upper quadrant pain is made worse with solids or liquids.  He has been using Prilosec 20 mg and getting some minimal relief of the symptoms but not using this on a regular basis.  He cannot relate this to any particular foods he has had no nausea, vomiting, diarrhea or constipation.   Review of Systems     Objective:   Physical Exam Alert and complaining of abdominal pain.  Abdominal exam shows decreased bowel sounds without masses or tenderness.       Assessment & Plan:  Left upper quadrant abdominal pain I will place him back on Prilosec 40 mg.  He is to take this regularly.  Pay attention to any particular foods that make this better or worse.  Discussed follow-up with this concerning gallbladder versus GI referral.

## 2022-04-18 NOTE — Telephone Encounter (Signed)
Please call re Trulicity PA

## 2022-05-10 ENCOUNTER — Ambulatory Visit (INDEPENDENT_AMBULATORY_CARE_PROVIDER_SITE_OTHER): Payer: Managed Care, Other (non HMO) | Admitting: Family Medicine

## 2022-05-10 ENCOUNTER — Encounter: Payer: Self-pay | Admitting: Family Medicine

## 2022-05-10 VITALS — BP 150/98 | HR 78 | Ht 76.75 in | Wt 398.4 lb

## 2022-05-10 DIAGNOSIS — E785 Hyperlipidemia, unspecified: Secondary | ICD-10-CM

## 2022-05-10 DIAGNOSIS — I152 Hypertension secondary to endocrine disorders: Secondary | ICD-10-CM

## 2022-05-10 DIAGNOSIS — E118 Type 2 diabetes mellitus with unspecified complications: Secondary | ICD-10-CM

## 2022-05-10 DIAGNOSIS — J301 Allergic rhinitis due to pollen: Secondary | ICD-10-CM | POA: Diagnosis not present

## 2022-05-10 DIAGNOSIS — Z0000 Encounter for general adult medical examination without abnormal findings: Secondary | ICD-10-CM

## 2022-05-10 DIAGNOSIS — Z23 Encounter for immunization: Secondary | ICD-10-CM | POA: Diagnosis not present

## 2022-05-10 DIAGNOSIS — E1169 Type 2 diabetes mellitus with other specified complication: Secondary | ICD-10-CM

## 2022-05-10 DIAGNOSIS — N529 Male erectile dysfunction, unspecified: Secondary | ICD-10-CM

## 2022-05-10 DIAGNOSIS — E291 Testicular hypofunction: Secondary | ICD-10-CM

## 2022-05-10 DIAGNOSIS — Z8042 Family history of malignant neoplasm of prostate: Secondary | ICD-10-CM

## 2022-05-10 DIAGNOSIS — E1159 Type 2 diabetes mellitus with other circulatory complications: Secondary | ICD-10-CM

## 2022-05-10 DIAGNOSIS — N200 Calculus of kidney: Secondary | ICD-10-CM

## 2022-05-10 DIAGNOSIS — K219 Gastro-esophageal reflux disease without esophagitis: Secondary | ICD-10-CM

## 2022-05-10 LAB — POCT UA - MICROALBUMIN
Albumin/Creatinine Ratio, Urine, POC: 15
Creatinine, POC: 135.4 mg/dL
Microalbumin Ur, POC: 20.3 mg/L

## 2022-05-10 LAB — POCT URINALYSIS DIP (PROADVANTAGE DEVICE)
Bilirubin, UA: NEGATIVE
Blood, UA: NEGATIVE
Glucose, UA: NEGATIVE mg/dL
Ketones, POC UA: NEGATIVE mg/dL
Leukocytes, UA: NEGATIVE
Nitrite, UA: NEGATIVE
Protein Ur, POC: NEGATIVE mg/dL
Specific Gravity, Urine: 1.015
Urobilinogen, Ur: 0.2
pH, UA: 7 (ref 5.0–8.0)

## 2022-05-10 LAB — POCT GLYCOSYLATED HEMOGLOBIN (HGB A1C): Hemoglobin A1C: 7 % — AB (ref 4.0–5.6)

## 2022-05-10 MED ORDER — AMLODIPINE BESYLATE 10 MG PO TABS: 10.0000 mg | ORAL_TABLET | Freq: Every day | ORAL | 3 refills | Status: DC

## 2022-05-10 MED ORDER — CARVEDILOL 25 MG PO TABS: 25.0000 mg | ORAL_TABLET | Freq: Two times a day (BID) | ORAL | 3 refills | Status: DC

## 2022-05-10 MED ORDER — TRULICITY 1.5 MG/0.5ML ~~LOC~~ SOAJ: 1.5000 mg | SUBCUTANEOUS | 1 refills | Status: DC

## 2022-05-10 MED ORDER — METFORMIN HCL 500 MG PO TABS: 500.0000 mg | ORAL_TABLET | Freq: Two times a day (BID) | ORAL | 1 refills | Status: DC

## 2022-05-10 MED ORDER — PANTOPRAZOLE SODIUM 40 MG PO TBEC: 40.0000 mg | DELAYED_RELEASE_TABLET | Freq: Two times a day (BID) | ORAL | 1 refills | Status: DC

## 2022-05-10 MED ORDER — ROSUVASTATIN CALCIUM 40 MG PO TABS: 40.0000 mg | ORAL_TABLET | Freq: Every day | ORAL | 3 refills | Status: DC

## 2022-05-10 MED ORDER — LOSARTAN POTASSIUM-HCTZ 100-12.5 MG PO TABS: 1.0000 | ORAL_TABLET | Freq: Every day | ORAL | 3 refills | Status: DC

## 2022-05-10 MED ORDER — TADALAFIL 20 MG PO TABS: ORAL_TABLET | ORAL | 3 refills | Status: DC

## 2022-05-10 NOTE — Progress Notes (Signed)
Complete physical exam  Patient: Guy Taylor   DOB: Oct 02, 1970   51 y.o. Male  MRN: [id number]  Subjective:    Chief Complaint  Patient presents with   Annual Exam    Annual physical exam. Fasting. No additional concerns.     Guy Taylor is a 51 y.o. male who presents today for a complete physical exam. He reports consuming a  on Keto diet  diet.  Changed to a better diet.  Home exercise routine includes walking 4 days a week. He generally feels fairly well. He reports sleeping poorly.  He has lost weight since his last visit due to his dietary changes and increase physical activity.  He did have Cologuard in 2021.  He is does have reflux symptoms and is using Protonix on a regular basis.  Continues on Cialis to help with his ED.  His allergies seem to be under good control.  He continues on Trulicity as well as metformin and is having no difficulty with that.  He is using amlodipine, Coreg and losartan/HCTZ for his blood pressure.  He does have a previous history of hypogonadism but states that he did not get much benefit from testosterone.  He does have a previous history of renal stones but has not had any in quite some time.  He does have a family history of prostate cancer and would like to have that checked.  His work and home life are going well.  Otherwise his family and social history as well as health maintenance and immunizations was reviewed   Most recent fall risk assessment:    05/10/2022    2:38 PM  [place] in the past year? 0  Number falls in past yr: 0  Injury with Fall? 0  Risk for fall due to : No Fall Risks  Follow up Falls evaluation completed     Most recent depression screenings:    05/10/2022    2:38 PM 03/09/2020    8:54 AM  PHQ 2/9 Scores  PHQ - 2 Score 0 0    Vision:Not within last year  and Dental: No current dental problems and Receives regular dental care    Patient Care Team: Denita Lung, MD as PCP - General (Family  Medicine)   Outpatient Medications Prior to Visit  Medication Sig   Alcohol Swabs (ALCOHOL PREP) PADS 1 each by Does not apply route 2 (two) times daily.   glucose blood test strip Test twice a day. Pt uses a one touch ultra meter   TRUEPLUS LANCETS 30G MISC USE AS DIRECTED TO TEST TWICE DAILY   [DISCONTINUED] amLODipine (NORVASC) 10 MG tablet TAKE 1 TABLET DAILY   [DISCONTINUED] carvedilol (COREG) 25 MG tablet TAKE 1 TABLET TWICE A DAY WITH MEALS   [DISCONTINUED] Dulaglutide (TRULICITY) 0.86 VH/8.4ON SOPN INJECT 0.75 MG UNDER THE SKIN ONCE A WEEK   [DISCONTINUED] losartan-hydrochlorothiazide (HYZAAR) 100-12.5 MG tablet TAKE 1 TABLET DAILY   [DISCONTINUED] metFORMIN (GLUCOPHAGE) 500 MG tablet TAKE 1 TABLET TWICE A DAY WITH MEALS   [DISCONTINUED] omeprazole (PRILOSEC) 40 MG capsule Take 1 capsule (40 mg total) by mouth daily.   [DISCONTINUED] rosuvastatin (CRESTOR) 40 MG tablet Take 1 tablet (40 mg total) by mouth daily.   [DISCONTINUED] tadalafil (CIALIS) 20 MG tablet TAKE 1 TABLET DAILY AS NEEDED FOR ERECTILE DYSFUNCTION   sucralfate (CARAFATE) 1 g tablet Take 1 tablet (1 g total) by mouth 4 (four) times daily -  with meals and at  bedtime. (Patient not taking: Reported on 05/10/2022)   [DISCONTINUED] pantoprazole (PROTONIX) 40 MG tablet Take 1 tablet (40 mg total) by mouth 2 (two) times daily before a meal. (Patient not taking: Reported on 05/10/2022)   No facility-administered medications prior to visit.    Review of Systems  All other systems reviewed and are negative.         Objective:     BP (!) 150/98   Pulse 78   Ht 6' 4.75" (1.949 m)   Wt (!) 398 lb 6.4 oz (180.7 kg)   SpO2 96% Comment: room air  BMI 47.55 kg/m    Physical Exam  Alert and in no distress. Tympanic membranes and canals are normal. Pharyngeal area is normal. Neck is supple without adenopathy or thyromegaly. Cardiac exam shows a regular sinus rhythm without murmurs or gallops. Lungs are clear to  auscultation.  Diabetic foot exam is normal and is recorded.  Hemoglobin A1c is 7.0. Hemoglobin A1c is 7.0 Results for orders placed or performed in visit on 05/10/22  POCT Urinalysis DIP (Proadvantage Device)  Result Value Ref Range   Color, UA yellow yellow   Clarity, UA cloudy (A) clear   Glucose, UA negative negative mg/dL   Bilirubin, UA negative negative   Ketones, POC UA negative negative mg/dL   Specific Gravity, Urine 1.015    Blood, UA negative negative   pH, UA 7.0 5.0 - 8.0   Protein Ur, POC negative negative mg/dL   Urobilinogen, Ur 0.2    Nitrite, UA Negative Negative   Leukocytes, UA Negative Negative  POCT glycosylated hemoglobin (Hb A1C)  Result Value Ref Range   Hemoglobin A1C 7.0 (A) 4.0 - 5.6 %  POCT UA - Microalbumin  Result Value Ref Range   Microalbumin Ur, POC 20.3 mg/L   Creatinine, POC 135.4 mg/dL   Albumin/Creatinine Ratio, Urine, POC 15.0        Assessment & Plan:   Routine general medical examination at a health care facility - Plan: POCT Urinalysis DIP (Proadvantage Device)  Type 2 diabetes mellitus with complication, without long-term current use of insulin (HCC) - Plan: POCT glycosylated hemoglobin (Hb A1C), POCT UA - Microalbumin, metFORMIN (GLUCOPHAGE) 500 MG tablet  Hypertension associated with diabetes ([place]) - Plan: amLODipine (NORVASC) 10 MG tablet, carvedilol (COREG) 25 MG tablet, losartan-hydrochlorothiazide (HYZAAR) 100-12.5 MG tablet  Seasonal allergic rhinitis due to pollen  Hyperlipidemia associated with type 2 diabetes mellitus (HCC) - Plan: Lipid panel, rosuvastatin (CRESTOR) 40 MG tablet  Hypogonadism male - Plan: Testosterone  Renal stone  Family history of prostate cancer in father - Plan: PSA  Need for Tdap vaccination - Plan: Tdap vaccine greater than or equal to 7yo IM  Need for COVID-19 vaccine - Plan: [place] Fall 2023 Covid-19 Vaccine 64yr and older  Erectile dysfunction, unspecified erectile dysfunction type -  Plan: tadalafil (CIALIS) 20 MG tablet  Gastroesophageal reflux disease without esophagitis - Plan: pantoprazole (PROTONIX) 40 MG tablet  Immunization History  Administered Date(s) Administered   COVID-19, mRNA, vaccine(Comirnaty)12 years and older 05/10/2022   Influenza Whole 03/23/2009   Influenza,inj,Quad PF,6+ Mos 02/07/2013, 03/11/2014, 01/29/2015, 04/28/2016, 02/26/2018, 03/08/2019, 03/09/2020, 03/18/2021, 01/25/2022   Moderna Covid-19 Vaccine Bivalent Booster 13yr& up 03/18/2021   Moderna Sars-Covid-2 Vaccination 07/24/2019, 08/23/2019, 03/27/2020   Pneumococcal Conjugate-13 02/26/2018   Pneumococcal Polysaccharide-23 03/08/2019   Tdap 07/26/2011, 05/10/2022    Health Maintenance  Topic Date Due   Zoster Vaccines- Shingrix (1 of 2) Never done   OPHTHALMOLOGY EXAM  03/25/2022   HIV Screening  01/26/2023 (Originally 03/17/1986)   HEMOGLOBIN A1C  11/09/2022   Diabetic kidney evaluation - eGFR measurement  01/21/2023   Diabetic kidney evaluation - Urine ACR  05/11/2023   FOOT EXAM  05/11/2023   COLONOSCOPY (Pts 45-61yr Insurance coverage will need to be confirmed)  11/15/2024   DTaP/Tdap/Td (3 - Td or Tdap) 05/10/2032   INFLUENZA VACCINE  Completed   COVID-19 Vaccine  Completed   Hepatitis C Screening  Completed   HPV VACCINES  Aged Out    I complemented him on his weight loss.  I will increase his Trulicity and hopefully this will help with his weight loss as well as his diabetes.  He is to follow-up with getting an eye exam since he can no longer see Dr. GGirtha Rm  Recommend cutting back on the Protonix to every other day and even less often than that for control of his reflux symptoms.  Recheck here in 4 months. Problem List Items Addressed This Visit     Allergic rhinitis, seasonal   Erectile dysfunction   Relevant Medications   tadalafil (CIALIS) 20 MG tablet   Family history of prostate cancer in father   Relevant Orders   PSA   Gastroesophageal reflux disease  without esophagitis   Relevant Medications   pantoprazole (PROTONIX) 40 MG tablet   Hyperlipidemia associated with type 2 diabetes mellitus (HCC)   Relevant Medications   Dulaglutide (TRULICITY) 1.5 MOP/9.2TWSOPN   amLODipine (NORVASC) 10 MG tablet   carvedilol (COREG) 25 MG tablet   losartan-hydrochlorothiazide (HYZAAR) 100-12.5 MG tablet   metFORMIN (GLUCOPHAGE) 500 MG tablet   rosuvastatin (CRESTOR) 40 MG tablet   tadalafil (CIALIS) 20 MG tablet   Other Relevant Orders   Lipid panel   Hypertension associated with diabetes (HCC)   Relevant Medications   Dulaglutide (TRULICITY) 1.5 MKM/6.2MMSOPN   amLODipine (NORVASC) 10 MG tablet   carvedilol (COREG) 25 MG tablet   losartan-hydrochlorothiazide (HYZAAR) 100-12.5 MG tablet   metFORMIN (GLUCOPHAGE) 500 MG tablet   rosuvastatin (CRESTOR) 40 MG tablet   tadalafil (CIALIS) 20 MG tablet   Hypogonadism male   Relevant Orders   Testosterone   Renal stone   Type 2 diabetes mellitus with complication, without long-term current use of insulin (HCC)   Relevant Medications   Dulaglutide (TRULICITY) 1.5 MNO/1.7RNSOPN   losartan-hydrochlorothiazide (HYZAAR) 100-12.5 MG tablet   metFORMIN (GLUCOPHAGE) 500 MG tablet   rosuvastatin (CRESTOR) 40 MG tablet   Other Relevant Orders   POCT glycosylated hemoglobin (Hb A1C) (Completed)   POCT UA - Microalbumin (Completed)   Other Visit Diagnoses     Routine general medical examination at a health care facility    -  Primary   Relevant Orders   POCT Urinalysis DIP (Proadvantage Device) (Completed)   Need for Tdap vaccination       Relevant Orders   Tdap vaccine greater than or equal to 7yo IM (Completed)   Need for COVID-19 vaccine       Relevant Orders   Pfizer Fall 2023 Covid-19 Vaccine 122yrand older (Completed)      4 months    JoJill AlexandersMD

## 2022-05-11 LAB — TESTOSTERONE: Testosterone: 197 ng/dL — ABNORMAL LOW (ref 264–916)

## 2022-05-11 LAB — LIPID PANEL
Chol/HDL Ratio: 3.6 ratio (ref 0.0–5.0)
Cholesterol, Total: 204 mg/dL — ABNORMAL HIGH (ref 100–199)
HDL: 56 mg/dL
LDL Chol Calc (NIH): 130 mg/dL — ABNORMAL HIGH (ref 0–99)
Triglycerides: 100 mg/dL (ref 0–149)
VLDL Cholesterol Cal: 18 mg/dL (ref 5–40)

## 2022-05-11 LAB — PSA: Prostate Specific Ag, Serum: 0.5 ng/mL (ref 0.0–4.0)

## 2022-05-20 ENCOUNTER — Ambulatory Visit: Payer: Managed Care, Other (non HMO) | Admitting: Nurse Practitioner

## 2022-05-30 NOTE — Telephone Encounter (Signed)
P.A. has already been approved.  Called pr & he states he was waiting to hear back about his shipment of Trulicity & was informed it has been shipped

## 2022-08-11 ENCOUNTER — Encounter: Payer: Self-pay | Admitting: Cardiovascular Disease

## 2022-08-11 NOTE — Progress Notes (Unsigned)
Cardiology Office Note:    Date:  08/13/2022   ID:  Guy Taylor, DOB 11/09/1970, MRN [id number]  PCP:  Denita Lung, MD   [place] [facility] Cardiologist:  Arsenia Goracke    Referring MD: Denita Lung, MD   Chief Complaint  Patient presents with   Obesity    History of Present Illness:   sept. 22, 2023    Guy Taylor is a 52 y.o. male with a hx of HTN,  DM , HLD ,chest pain , DM, CKD  Seen with Tiffany, wife  4 weeks ago. Developed CP while sitting at his desk while twisting / turning his torso.  Turned into a spasm like pain  Pain lasted for several hours Radiated through to his back   Went to the emergency room.  CT angiogram of the chest was negative for aneurysm, dissection.   Troponins were negative .  Had GI cocktail - symptoms resolved.   Has not had any chest pain orsince  Has been active at work Has been increaseing his cardio exercise   .Works for [employer] with  Family hx  - for CAD   He is on rosuvastatin for hyperlipidemia.  His last labs were drawn in October, 2021.  Total cholesterol was 200 HDL was 66 LDL is 124 Triglyceride level is 57 Hemoglobin A1c is 10.1.  Has gained 100 lbs over the past several years .    No smoker    Grew up in [place] is 423 lbs. Body mass index is 46.18 kg/m.   August 12, 2022 Guy Taylor is seen today for follow up of his HTN, HLD , morbid obesity  No CP or dyspnea   Has DM LDL is 130  Goal LDL is < 70  Will add Zetia 10 mg  Advised continued weight loss    Past Medical History:  Diagnosis Date   Allergic rhinitis    Asthma    Diabetes mellitus    Hypertension    Hypogonadism male    Meniscus tear    rt knee   Obesity    Renal disorder    Stones in the urinary tract     Past Surgical History:  Procedure Laterality Date   ESOPHAGOGASTRODUODENOSCOPY N/A 11/16/2014   Procedure: ESOPHAGOGASTRODUODENOSCOPY (EGD);  Surgeon: Laurence Spates, MD;   Location[facility] ENDOSCOPY;  Service: Endoscopy;  Laterality: N/A;   KNEE ARTHROSCOPY  02/13/2012   Procedure: ARTHROSCOPY KNEE;  Surgeon: Alta Corning, MD;  Location: [facility];  Service: Orthopedics;  Laterality: Right;    Current Medications: Current Meds  Medication Sig   Alcohol Swabs (ALCOHOL PREP) PADS 1 each by Does not apply route 2 (two) times daily.   amLODipine (NORVASC) 10 MG tablet Take 1 tablet (10 mg total) by mouth daily.   carvedilol (COREG) 25 MG tablet Take 1 tablet (25 mg total) by mouth 2 (two) times daily with a meal.   Dulaglutide (TRULICITY) 1.5 0000000 SOPN Inject 1.5 mg into the skin once a week.   ezetimibe (ZETIA) 10 MG tablet Take 1 tablet (10 mg total) by mouth daily.   glucose blood test strip Test twice a day. Pt uses a one touch ultra meter   losartan-hydrochlorothiazide (HYZAAR) 100-12.5 MG tablet Take 1 tablet by mouth daily.   metFORMIN (GLUCOPHAGE) 500 MG tablet Take 1 tablet (500 mg total) by mouth 2 (two) times daily with a meal.   pantoprazole (PROTONIX) 40 MG tablet Take  1 tablet (40 mg total) by mouth 2 (two) times daily before a meal.   rosuvastatin (CRESTOR) 40 MG tablet Take 1 tablet (40 mg total) by mouth daily.   tadalafil (CIALIS) 20 MG tablet TAKE 1 TABLET DAILY AS NEEDED FOR ERECTILE DYSFUNCTION   TRUEPLUS LANCETS 30G MISC USE AS DIRECTED TO TEST TWICE DAILY   [DISCONTINUED] sucralfate (CARAFATE) 1 g tablet Take 1 tablet (1 g total) by mouth 4 (four) times daily -  with meals and at bedtime.     Allergies:   Lisinopril   Social History   Socioeconomic History   Marital status: Married    Spouse name: Not on file   Number of children: Not on file   Years of education: Not on file   Highest education level: Not on file  Occupational History   Not on file  Tobacco Use   Smoking status: Never   Smokeless tobacco: Never  Substance and Sexual Activity   Alcohol use: No   Drug use: No   Sexual activity: Yes  Other Topics Concern   Not on  file  Social History Narrative   Works for [employer] and [employer], Scientist, product/process development.   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   Social Determinants of Health   Financial Resource Strain: Not on file  Food Insecurity: Not on file  Transportation Needs: Not on file  Physical Activity: Not on file  Stress: Not on file  Social Connections: Not on file     Family History: The patient's family history includes Diabetes in his mother.  ROS:   Please see the history of present illness.     All other systems reviewed and are negative.  EKGs/Labs/Other Studies Reviewed:    The following studies were reviewed today:   EKG:    Recent Labs: 01/20/2022: BUN 17; Creatinine, Ser 1.06; Hemoglobin 14.6; Platelets 234; Potassium 3.9; Sodium 138  Recent Lipid Panel    Component Value Date/Time   CHOL 204 (H) 05/10/2022 1535   TRIG 100 05/10/2022 1535   HDL 56 05/10/2022 1535   CHOLHDL 3.6 05/10/2022 1535   CHOLHDL 3.3 05/18/2017 1603   VLDL 10 04/28/2016 1454   LDLCALC 130 (H) 05/10/2022 1535   LDLCALC 138 (H) 05/18/2017 1603     Risk Assessment/Calculations:                Physical Exam:    Physical Exam: Blood pressure 110/85, pulse 76, height 6\' 6"  (1.981 m), weight (!) 399 lb 9.6 oz (181.3 kg), SpO2 95 %.       GEN:  morbidly obese, middle age man ,  in no acute distress HEENT: Normal NECK: No JVD; No carotid bruits LYMPHATICS: No lymphadenopathy CARDIAC: RRR , no murmurs, rubs, gallops RESPIRATORY:  Clear to auscultation without rales, wheezing or rhonchi  ABDOMEN: Soft, non-tender, non-distended MUSCULOSKELETAL:  No edema; No deformity  SKIN: Warm and dry NEUROLOGIC:  Alert and oriented x 3   ASSESSMENT:    1. Morbid obesity ([place])   2. Mixed hyperlipidemia     PLAN:      Chest pain:-None cardiac chest pain.  EKG and troponins were negative.  The episode occurred while he was twisting and turning.   Is not having any chest pain    2.  Obesity:    advised continued weight loss  3.  Hyperlipidemia :   LDL is 130.   Is goal LDL is < 70 Currently on rosuvastatin 40. Add zetia 10 mg a  day Check labs in 3 months            Medication Adjustments/Labs and Tests Ordered: Current medicines are reviewed at length with the patient today.  Concerns regarding medicines are outlined above.  Orders Placed This Encounter  Procedures   Basic metabolic panel   Lipid panel   Meds ordered this encounter  Medications   ezetimibe (ZETIA) 10 MG tablet    Sig: Take 1 tablet (10 mg total) by mouth daily.    Dispense:  90 tablet    Refill:  3     Patient Instructions  Medication Instructions:  START Ezetimibe (zetia) 10mg  once daily  *If you need a refill on your cardiac medications before your next appointment, please call your pharmacy*   Lab Work: BMP and Lipids in 3 months If you have labs (blood work) drawn today and your tests are completely normal, you will receive your results only by: [place] (if you have MyChart) OR A paper copy in the mail If you have any lab test that is abnormal or we need to change your treatment, we will call you to review the results.  Follow-Up: At [facility], you and your health needs are our priority.  As part of our continuing mission to provide you with exceptional heart care, we have created designated Provider Care Teams.  These Care Teams include your primary Cardiologist (physician) and Advanced Practice Providers (APPs -  Physician Assistants and Nurse Practitioners) who all work together to provide you with the care you need, when you need it.  Your next appointment:   6 month(s)  Provider:   Mertie Moores, MD   Signed, Mertie Moores, MD  08/13/2022 8:18 AM    [place]

## 2022-08-12 ENCOUNTER — Encounter: Payer: Self-pay | Admitting: Cardiovascular Disease

## 2022-08-12 ENCOUNTER — Ambulatory Visit: Payer: Managed Care, Other (non HMO) | Attending: Cardiovascular Disease | Admitting: Cardiovascular Disease

## 2022-08-12 DIAGNOSIS — E782 Mixed hyperlipidemia: Secondary | ICD-10-CM | POA: Diagnosis not present

## 2022-08-12 MED ORDER — EZETIMIBE 10 MG PO TABS: 10.0000 mg | ORAL_TABLET | Freq: Every day | ORAL | 3 refills | Status: DC

## 2022-08-12 NOTE — Patient Instructions (Signed)
Medication Instructions:  START Ezetimibe (zetia) 10mg  once daily  *If you need a refill on your cardiac medications before your next appointment, please call your pharmacy*   Lab Work: BMP and Lipids in 3 months If you have labs (blood work) drawn today and your tests are completely normal, you will receive your results only by: [place] (if you have MyChart) OR A paper copy in the mail If you have any lab test that is abnormal or we need to change your treatment, we will call you to review the results.  Follow-Up: At [facility], you and your health needs are our priority.  As part of our continuing mission to provide you with exceptional heart care, we have created designated Provider Care Teams.  These Care Teams include your primary Cardiologist (physician) and Advanced Practice Providers (APPs -  Physician Assistants and Nurse Practitioners) who all work together to provide you with the care you need, when you need it.  Your next appointment:   6 month(s)  Provider:   Mertie Moores, MD

## 2022-09-15 ENCOUNTER — Ambulatory Visit: Payer: Managed Care, Other (non HMO) | Admitting: Family Medicine

## 2022-09-27 ENCOUNTER — Ambulatory Visit: Payer: Managed Care, Other (non HMO) | Admitting: Family Medicine

## 2022-11-18 ENCOUNTER — Ambulatory Visit: Payer: Managed Care, Other (non HMO) | Attending: Family Medicine

## 2023-02-05 ENCOUNTER — Telehealth: Payer: Self-pay | Admitting: Family Medicine

## 2023-02-05 NOTE — Telephone Encounter (Signed)
P.A. Trudee Kuster

## 2023-02-10 NOTE — Telephone Encounter (Signed)
P.A. approved til 02/06/24, sent mychart

## 2023-03-21 ENCOUNTER — Encounter: Payer: Self-pay | Admitting: Family Medicine

## 2023-03-21 ENCOUNTER — Other Ambulatory Visit: Payer: Self-pay | Admitting: *Deleted

## 2023-03-21 ENCOUNTER — Ambulatory Visit: Payer: Managed Care, Other (non HMO) | Admitting: Family Medicine

## 2023-03-21 VITALS — BP 130/80 | HR 90 | Ht 78.0 in | Wt >= 6400 oz

## 2023-03-21 DIAGNOSIS — E1159 Type 2 diabetes mellitus with other circulatory complications: Secondary | ICD-10-CM

## 2023-03-21 DIAGNOSIS — I152 Hypertension secondary to endocrine disorders: Secondary | ICD-10-CM

## 2023-03-21 DIAGNOSIS — E118 Type 2 diabetes mellitus with unspecified complications: Secondary | ICD-10-CM

## 2023-03-21 DIAGNOSIS — E1169 Type 2 diabetes mellitus with other specified complication: Secondary | ICD-10-CM | POA: Diagnosis not present

## 2023-03-21 DIAGNOSIS — E782 Mixed hyperlipidemia: Secondary | ICD-10-CM

## 2023-03-21 DIAGNOSIS — E785 Hyperlipidemia, unspecified: Secondary | ICD-10-CM

## 2023-03-21 DIAGNOSIS — Z23 Encounter for immunization: Secondary | ICD-10-CM | POA: Diagnosis not present

## 2023-03-21 DIAGNOSIS — N529 Male erectile dysfunction, unspecified: Secondary | ICD-10-CM

## 2023-03-21 DIAGNOSIS — K219 Gastro-esophageal reflux disease without esophagitis: Secondary | ICD-10-CM

## 2023-03-21 LAB — POCT GLYCOSYLATED HEMOGLOBIN (HGB A1C): Hemoglobin A1C: 6.6 % — AB (ref 4.0–5.6)

## 2023-03-21 LAB — POCT UA - MICROALBUMIN
Albumin/Creatinine Ratio, Urine, POC: 12.5
Creatinine, POC: 225.1 mg/dL
Microalbumin Ur, POC: 28.2 mg/L

## 2023-03-21 MED ORDER — ROSUVASTATIN CALCIUM 40 MG PO TABS: 40.0000 mg | ORAL_TABLET | Freq: Every day | ORAL | 3 refills | Status: DC

## 2023-03-21 MED ORDER — BLOOD GLUCOSE TEST VI STRP: ORAL_STRIP | 5 refills | Status: AC

## 2023-03-21 MED ORDER — TIRZEPATIDE 7.5 MG/0.5ML ~~LOC~~ SOAJ: 7.5000 mg | SUBCUTANEOUS | 0 refills | Status: DC

## 2023-03-21 MED ORDER — LANCETS MISC. MISC: 5 refills | Status: AC

## 2023-03-21 MED ORDER — METFORMIN HCL 500 MG PO TABS: 500.0000 mg | ORAL_TABLET | Freq: Two times a day (BID) | ORAL | 1 refills | Status: DC

## 2023-03-21 MED ORDER — BLOOD GLUCOSE MONITORING SUPPL DEVI: 0 refills | Status: AC

## 2023-03-21 MED ORDER — TADALAFIL 20 MG PO TABS: ORAL_TABLET | ORAL | 3 refills | Status: DC

## 2023-03-21 MED ORDER — CARVEDILOL 25 MG PO TABS: 25.0000 mg | ORAL_TABLET | Freq: Two times a day (BID) | ORAL | 1 refills | Status: DC

## 2023-03-21 MED ORDER — LOSARTAN POTASSIUM-HCTZ 100-12.5 MG PO TABS: 1.0000 | ORAL_TABLET | Freq: Every day | ORAL | 1 refills | Status: DC

## 2023-03-21 MED ORDER — EZETIMIBE 10 MG PO TABS: 10.0000 mg | ORAL_TABLET | Freq: Every day | ORAL | 1 refills | Status: DC

## 2023-03-21 MED ORDER — LANCET DEVICE MISC: 0 refills | Status: AC

## 2023-03-21 MED ORDER — AMLODIPINE BESYLATE 10 MG PO TABS: 10.0000 mg | ORAL_TABLET | Freq: Every day | ORAL | 1 refills | Status: DC

## 2023-03-21 NOTE — Progress Notes (Signed)
   Subjective:    Patient ID: Guy Taylor, male    DOB: 1971/03/08, 52 y.o.   MRN: [id number]  HPI He is here for a recheck.  He also has a DOT exam scheduled and needs blood pressure check here.  Apparently has had higher readings elsewhere and needs 1 here today.  Further discussion concerning his diabetes indicates that he essentially eats 1 meal a day and needs anything that he wants in spite of the fact that he has been to diabetes and nutrition courses.  His wife is here with him.  He is taking Trulicity and metformin.  He also continues on Crestor, Cialis, Coreg, Norvasc and Zetia.   Review of Systems     Objective:    Physical Exam Alert and in no distress otherwise not examined.  Hemoglobin A1c is 6.6       Assessment & Plan:  Type 2 diabetes mellitus with complication, without long-term current use of insulin (HCC) - Plan: POCT glycosylated hemoglobin (Hb A1C), CBC with Differential/Platelet, Comprehensive metabolic panel, Lipid panel, POCT UA - Microalbumin, metFORMIN (GLUCOPHAGE) 500 MG tablet  Morbid obesity (HCC) - Plan: ezetimibe (ZETIA) 10 MG tablet  Hypertension associated with diabetes (HCC) - Plan: CBC with Differential/Platelet, Comprehensive metabolic panel, amLODipine (NORVASC) 10 MG tablet, carvedilol (COREG) 25 MG tablet, losartan-hydrochlorothiazide (HYZAAR) 100-12.5 MG tablet  Hyperlipidemia associated with type 2 diabetes mellitus (HCC) - Plan: Lipid panel, rosuvastatin (CRESTOR) 40 MG tablet  Gastroesophageal reflux disease without esophagitis  Need for influenza vaccination - Plan: Flu vaccine trivalent PF, 6mos and older(Flulaval,Afluria,Fluarix,Fluzone)  Need for COVID-19 vaccine - Plan: Pfizer Comirnaty Covid -19 Vaccine 58yrs and older  Mixed hyperlipidemia - Plan: ezetimibe (ZETIA) 10 MG tablet  Erectile dysfunction, unspecified erectile dysfunction type - Plan: tadalafil (CIALIS) 20 MG tablet  After discussion with him, I will switch him to  [facility] and go midcycle on the dosing thinking he will probably tolerate this well.  He is to call me in 1 month to let me know how he is doing.  I again discussed the need for him to be more vigilant in taking good care of himself and treating his diet in particular his exercise is usually not an issue here.  Stressed the fact that there is nothing that I can do that we will him do what he will not do.

## 2023-03-22 LAB — LIPID PANEL
Chol/HDL Ratio: 3.2 ratio (ref 0.0–5.0)
Cholesterol, Total: 187 mg/dL (ref 100–199)
HDL: 59 mg/dL (ref >39)
LDL Chol Calc (NIH): 112 mg/dL — ABNORMAL HIGH (ref 0–99)
Triglycerides: 85 mg/dL (ref 0–149)
VLDL Cholesterol Cal: 16 mg/dL (ref 5–40)

## 2023-03-22 LAB — CBC WITH DIFFERENTIAL/PLATELET
Basophils Absolute: 0 10*3/uL (ref 0.0–0.2)
Basos: 1 %
EOS (ABSOLUTE): 0.3 10*3/uL (ref 0.0–0.4)
Eos: 6 %
Hematocrit: 45.6 % (ref 37.5–51.0)
Hemoglobin: 15.1 g/dL (ref 13.0–17.7)
Immature Grans (Abs): 0 10*3/uL (ref 0.0–0.1)
Immature Granulocytes: 0 %
Lymphocytes Absolute: 0.9 10*3/uL (ref 0.7–3.1)
Lymphs: 19 %
MCH: 28.2 pg (ref 26.6–33.0)
MCHC: 33.1 g/dL (ref 31.5–35.7)
MCV: 85 fL (ref 79–97)
Monocytes Absolute: 0.3 10*3/uL (ref 0.1–0.9)
Monocytes: 5 %
Neutrophils Absolute: 3.4 10*3/uL (ref 1.4–7.0)
Neutrophils: 69 %
Platelets: 240 10*3/uL (ref 150–450)
RBC: 5.35 x10E6/uL (ref 4.14–5.80)
RDW: 13.2 % (ref 11.6–15.4)
WBC: 4.9 10*3/uL (ref 3.4–10.8)

## 2023-03-22 LAB — COMPREHENSIVE METABOLIC PANEL
ALT: 11 [IU]/L (ref 0–44)
AST: 17 IU/L (ref 0–40)
Albumin: 4.5 g/dL (ref 3.8–4.9)
Alkaline Phosphatase: 95 IU/L (ref 44–121)
BUN/Creatinine Ratio: 16 (ref 9–20)
BUN: 16 mg/dL (ref 6–24)
Bilirubin Total: 0.5 mg/dL (ref 0.0–1.2)
CO2: 23 mmol/L (ref 20–29)
Calcium: 9.6 mg/dL (ref 8.7–10.2)
Chloride: 103 mmol/L (ref 96–106)
Creatinine, Ser: 1.03 mg/dL (ref 0.76–1.27)
Globulin, Total: 2.4 g/dL (ref 1.5–4.5)
Glucose: 144 mg/dL — ABNORMAL HIGH (ref 70–99)
Potassium: 4 mmol/L (ref 3.5–5.2)
Sodium: 140 mmol/L (ref 134–144)
Total Protein: 6.9 g/dL (ref 6.0–8.5)
eGFR: 87 mL/min/{1.73_m2} (ref >59)

## 2023-05-15 ENCOUNTER — Encounter: Payer: Managed Care, Other (non HMO) | Admitting: Family Medicine

## 2023-05-16 ENCOUNTER — Encounter: Payer: Managed Care, Other (non HMO) | Admitting: Family Medicine

## 2023-05-22 ENCOUNTER — Other Ambulatory Visit: Payer: Self-pay | Admitting: Family Medicine

## 2023-05-22 DIAGNOSIS — N529 Male erectile dysfunction, unspecified: Secondary | ICD-10-CM

## 2023-05-23 ENCOUNTER — Encounter: Payer: Managed Care, Other (non HMO) | Admitting: Family Medicine

## 2023-07-05 ENCOUNTER — Encounter: Payer: Managed Care, Other (non HMO) | Admitting: Family Medicine

## 2023-07-14 ENCOUNTER — Other Ambulatory Visit: Payer: Self-pay | Admitting: Family Medicine

## 2023-07-18 ENCOUNTER — Encounter: Payer: Self-pay | Admitting: Internal Medicine

## 2023-07-24 ENCOUNTER — Ambulatory Visit (INDEPENDENT_AMBULATORY_CARE_PROVIDER_SITE_OTHER): Payer: Managed Care, Other (non HMO) | Admitting: Family Medicine

## 2023-07-24 ENCOUNTER — Encounter: Payer: Self-pay | Admitting: Family Medicine

## 2023-07-24 VITALS — BP 138/86 | HR 81 | Ht 77.0 in | Wt >= 6400 oz

## 2023-07-24 DIAGNOSIS — K219 Gastro-esophageal reflux disease without esophagitis: Secondary | ICD-10-CM

## 2023-07-24 DIAGNOSIS — E782 Mixed hyperlipidemia: Secondary | ICD-10-CM

## 2023-07-24 DIAGNOSIS — E118 Type 2 diabetes mellitus with unspecified complications: Secondary | ICD-10-CM

## 2023-07-24 DIAGNOSIS — E291 Testicular hypofunction: Secondary | ICD-10-CM

## 2023-07-24 DIAGNOSIS — E1159 Type 2 diabetes mellitus with other circulatory complications: Secondary | ICD-10-CM

## 2023-07-24 DIAGNOSIS — E1169 Type 2 diabetes mellitus with other specified complication: Secondary | ICD-10-CM | POA: Diagnosis not present

## 2023-07-24 DIAGNOSIS — Z125 Encounter for screening for malignant neoplasm of prostate: Secondary | ICD-10-CM

## 2023-07-24 DIAGNOSIS — Z8042 Family history of malignant neoplasm of prostate: Secondary | ICD-10-CM

## 2023-07-24 DIAGNOSIS — N529 Male erectile dysfunction, unspecified: Secondary | ICD-10-CM

## 2023-07-24 DIAGNOSIS — Z0000 Encounter for general adult medical examination without abnormal findings: Secondary | ICD-10-CM | POA: Diagnosis not present

## 2023-07-24 DIAGNOSIS — Z23 Encounter for immunization: Secondary | ICD-10-CM | POA: Diagnosis not present

## 2023-07-24 DIAGNOSIS — G479 Sleep disorder, unspecified: Secondary | ICD-10-CM

## 2023-07-24 DIAGNOSIS — N5201 Erectile dysfunction due to arterial insufficiency: Secondary | ICD-10-CM

## 2023-07-24 DIAGNOSIS — J301 Allergic rhinitis due to pollen: Secondary | ICD-10-CM

## 2023-07-24 DIAGNOSIS — E785 Hyperlipidemia, unspecified: Secondary | ICD-10-CM

## 2023-07-24 LAB — POCT GLYCOSYLATED HEMOGLOBIN (HGB A1C): Hemoglobin A1C: 6.9 % — AB (ref 4.0–5.6)

## 2023-07-24 MED ORDER — METFORMIN HCL 500 MG PO TABS
500.0000 mg | ORAL_TABLET | Freq: Two times a day (BID) | ORAL | 1 refills | Status: DC
Start: 2023-07-24 — End: 2024-01-23

## 2023-07-24 MED ORDER — TADALAFIL 20 MG PO TABS: ORAL_TABLET | ORAL | 3 refills | Status: DC

## 2023-07-24 MED ORDER — AMLODIPINE BESYLATE 10 MG PO TABS: 10.0000 mg | ORAL_TABLET | Freq: Every day | ORAL | 3 refills | Status: AC

## 2023-07-24 MED ORDER — TIRZEPATIDE 5 MG/0.5ML ~~LOC~~ SOAJ: 5.0000 mg | SUBCUTANEOUS | 1 refills | Status: DC

## 2023-07-24 MED ORDER — LOSARTAN POTASSIUM-HCTZ 100-12.5 MG PO TABS: 1.0000 | ORAL_TABLET | Freq: Every day | ORAL | 3 refills | Status: AC

## 2023-07-24 MED ORDER — EZETIMIBE 10 MG PO TABS: 10.0000 mg | ORAL_TABLET | Freq: Every day | ORAL | 1 refills | Status: DC

## 2023-07-24 MED ORDER — CARVEDILOL 25 MG PO TABS: 25.0000 mg | ORAL_TABLET | Freq: Two times a day (BID) | ORAL | 3 refills | Status: AC

## 2023-07-24 NOTE — Progress Notes (Signed)
 Complete physical exam  Patient: Guy Taylor   DOB: [birth year]   53 y.o. Male  MRN: [id number]  Subjective:      Guy Taylor is a 53 y.o. male who presents today for a complete physical exam. He reports consuming a general diet. The patient does not participate in regular exercise at present. He generally feels fairly well. He reports sleeping poorly.  He states that he usually gets only about 4 hours of sleep and does get quite fatigued especially during the day.  He is taking Trulicity as well as metformin.  We did try to get him on Mounjaro in the past but there was supply issues with that.  He also has concerns about colonoscopy.  His last Cologuard was in 2021.  There is a family history of prostate cancer and he would like to be checked for that.  He also has a previous history of hypogonadism and is interested in pursuing this further.  He would like a refill on his Cialis.He did not complain of any reflux symptoms.  His allergies are causing no problems at the present time.  He continues on Crestor as well as Zetia and having no difficulty with that.  Continues on amlodipine, Coreg and losartan.  His work and home life are going well.  Most recent fall risk assessment:    07/24/2023    8:07 AM  Fall Risk   Falls in the past year? 0  Number falls in past yr: 0  Injury with Fall? 0  Risk for fall due to : No Fall Risks  Follow up Falls evaluation completed     Most recent depression screenings:    07/24/2023    8:07 AM 03/21/2023    3:22 PM  PHQ 2/9 Scores  PHQ - 2 Score 0 0        Patient Care Team: Ronnald Nian, MD as PCP - General (Family Medicine)   Outpatient Medications Prior to Visit  Medication Sig Note   Alcohol Swabs (ALCOHOL PREP) PADS 1 each by Does not apply route 2 (two) times daily.    Blood Glucose Monitoring Suppl DEVI Test 1-2 times day. Pend on Insurance    Glucose Blood (BLOOD GLUCOSE TEST STRIPS) STRP Test 1-2 times daily    Lancet Device  MISC 1-2 times daily    Lancets Misc. MISC 1-2 times a day    naproxen sodium (ALEVE) 220 MG tablet Take 660 mg by mouth.    pantoprazole (PROTONIX) 40 MG tablet Take 1 tablet (40 mg total) by mouth 2 (two) times daily before a meal.    rosuvastatin (CRESTOR) 40 MG tablet Take 1 tablet (40 mg total) by mouth daily.    TRUEPLUS LANCETS 30G MISC USE AS DIRECTED TO TEST TWICE DAILY    [DISCONTINUED] amLODipine (NORVASC) 10 MG tablet Take 1 tablet (10 mg total) by mouth daily.    [DISCONTINUED] carvedilol (COREG) 25 MG tablet Take 1 tablet (25 mg total) by mouth 2 (two) times daily with a meal.    [DISCONTINUED] losartan-hydrochlorothiazide (HYZAAR) 100-12.5 MG tablet Take 1 tablet by mouth daily.    [DISCONTINUED] metFORMIN (GLUCOPHAGE) 500 MG tablet Take 1 tablet (500 mg total) by mouth 2 (two) times daily with a meal.    [DISCONTINUED] tadalafil (CIALIS) 20 MG tablet TAKE 1 TABLET DAILY AS NEEDED FOR ERECTILE DYSFUNCTION    [DISCONTINUED] TRULICITY 1.5 MG/0.5ML SOAJ Inject into the skin. 07/24/2023: Once a week   [DISCONTINUED] ezetimibe (ZETIA) 10  MG tablet Take 1 tablet (10 mg total) by mouth daily.    [DISCONTINUED] tirzepatide (MOUNJARO) 7.5 MG/0.5ML Pen Inject 7.5 mg into the skin once a week. (Patient not taking: Reported on 07/24/2023) 07/24/2023: Not be able to get still taking trulicity   No facility-administered medications prior to visit.    Review of Systems  All other systems reviewed and are negative. Family and social history as well as health maintenance and immunizations was reviewed        Objective:       Physical Exam  Alert and in no distress. Tympanic membranes and canals are normal. Pharyngeal area is normal. Neck is supple without adenopathy or thyromegaly. Cardiac exam shows a regular sinus rhythm without murmurs or gallops. Lungs are clear to auscultation.  Abdominal exam difficult to do due to his weight.  Foot exam is normal. Hemoglobin A1c is 6.9      Assessment & Plan:     Routine general medical examination at a health care facility  Seasonal allergic rhinitis due to pollen  Erectile dysfunction due to arterial insufficiency  Gastroesophageal reflux disease without esophagitis  Hyperlipidemia associated with type 2 diabetes mellitus (HCC) - Plan: Lipid panel  Hypertension associated with diabetes (HCC) - Plan: amLODipine (NORVASC) 10 MG tablet, carvedilol (COREG) 25 MG tablet, losartan-hydrochlorothiazide (HYZAAR) 100-12.5 MG tablet  Hypogonadism male - Plan: Testosterone  Morbid obesity (HCC) - Plan: ezetimibe (ZETIA) 10 MG tablet  Type 2 diabetes mellitus with complication, without long-term current use of insulin (HCC) - Plan: POCT glycosylated hemoglobin (Hb A1C), tirzepatide (MOUNJARO) 5 MG/0.5ML Pen, metFORMIN (GLUCOPHAGE) 500 MG tablet, POCT UA - Microalbumin  Need for shingles vaccine - Plan: Varicella-zoster vaccine IM  Mixed hyperlipidemia - Plan: ezetimibe (ZETIA) 10 MG tablet  Screening for prostate cancer - Plan: PSA  Erectile dysfunction, unspecified erectile dysfunction type - Plan: tadalafil (CIALIS) 20 MG tablet  Sleep disturbance - Plan: Home sleep test  Family history of prostate cancer in father  Immunization History  Administered Date(s) Administered   Influenza Whole 03/23/2009   Influenza, Seasonal, Injecte, Preservative Fre 03/21/2023   Influenza,inj,Quad PF,6+ Mos 02/07/2013, 03/11/2014, 01/29/2015, 04/28/2016, 02/26/2018, 03/08/2019, 03/09/2020, 03/18/2021, 01/25/2022   Moderna Covid-19 Vaccine Bivalent Booster 92yrs & up 03/18/2021   Moderna Sars-Covid-2 Vaccination 07/24/2019, 08/23/2019, 03/27/2020   Pfizer(Comirnaty)Fall Seasonal Vaccine 12 years and older 05/10/2022, 03/21/2023   Pneumococcal Conjugate-13 02/26/2018   Pneumococcal Polysaccharide-23 03/08/2019   Tdap 07/26/2011, 05/10/2022    Health Maintenance  Topic Date Due   HIV Screening  Never done   Zoster Vaccines- Shingrix  (1 of 2) Never done   OPHTHALMOLOGY EXAM  03/25/2022   HEMOGLOBIN A1C  01/24/2024   Diabetic kidney evaluation - eGFR measurement  03/20/2024   Diabetic kidney evaluation - Urine ACR  03/20/2024   FOOT EXAM  07/23/2024   Colonoscopy  11/15/2024   DTaP/Tdap/Td (3 - Td or Tdap) 05/10/2032   Pneumococcal Vaccine 20-68 Years old (2 of 2 - PPSV23 or PCV20) 03/17/2036   INFLUENZA VACCINE  Completed   COVID-19 Vaccine  Completed   Hepatitis C Screening  Completed   HPV VACCINES  Aged Out    Discussed colon cancer screening with him.  He will let me know whether he wants a colonoscopy or another Cologuard. I then discussed his sleep issues and we will order a sleep study. Discussed the possible side effects of Mounjaro with him.  He is to call me in 1 month to let me know how he is  doing.  His other medications were renewed.  Recheck here in about 4 months.  Problem List Items Addressed This Visit     Allergic rhinitis, seasonal   Erectile dysfunction   Relevant Medications   tadalafil (CIALIS) 20 MG tablet   Family history of prostate cancer in father   Gastroesophageal reflux disease without esophagitis   Hyperlipidemia associated with type 2 diabetes mellitus (HCC)   Relevant Medications   ezetimibe (ZETIA) 10 MG tablet   tirzepatide (MOUNJARO) 5 MG/0.5ML Pen   amLODipine (NORVASC) 10 MG tablet   carvedilol (COREG) 25 MG tablet   metFORMIN (GLUCOPHAGE) 500 MG tablet   losartan-hydrochlorothiazide (HYZAAR) 100-12.5 MG tablet   tadalafil (CIALIS) 20 MG tablet   Other Relevant Orders   Lipid panel   Hypertension associated with diabetes (HCC)   Relevant Medications   ezetimibe (ZETIA) 10 MG tablet   tirzepatide (MOUNJARO) 5 MG/0.5ML Pen   amLODipine (NORVASC) 10 MG tablet   carvedilol (COREG) 25 MG tablet   metFORMIN (GLUCOPHAGE) 500 MG tablet   losartan-hydrochlorothiazide (HYZAAR) 100-12.5 MG tablet   tadalafil (CIALIS) 20 MG tablet   Hypogonadism male   Relevant Orders    Testosterone   Morbid obesity (HCC)   Relevant Medications   ezetimibe (ZETIA) 10 MG tablet   tirzepatide (MOUNJARO) 5 MG/0.5ML Pen   metFORMIN (GLUCOPHAGE) 500 MG tablet   Type 2 diabetes mellitus with complication, without long-term current use of insulin (HCC)   Relevant Medications   tirzepatide (MOUNJARO) 5 MG/0.5ML Pen   metFORMIN (GLUCOPHAGE) 500 MG tablet   losartan-hydrochlorothiazide (HYZAAR) 100-12.5 MG tablet   Other Relevant Orders   POCT glycosylated hemoglobin (Hb A1C) (Completed)   POCT UA - Microalbumin   Other Visit Diagnoses       Routine general medical examination at a health care facility    -  Primary     Need for shingles vaccine       Relevant Orders   Varicella-zoster vaccine IM     Mixed hyperlipidemia       Relevant Medications   ezetimibe (ZETIA) 10 MG tablet   amLODipine (NORVASC) 10 MG tablet   carvedilol (COREG) 25 MG tablet   losartan-hydrochlorothiazide (HYZAAR) 100-12.5 MG tablet   tadalafil (CIALIS) 20 MG tablet     Screening for prostate cancer       Relevant Orders   PSA     Sleep disturbance       Relevant Orders   Home sleep test      Return in about 4 months (around 11/23/2023).     Sharlot Gowda, MD

## 2023-07-25 ENCOUNTER — Encounter: Payer: Self-pay | Admitting: Family Medicine

## 2023-07-25 ENCOUNTER — Other Ambulatory Visit: Payer: Self-pay | Admitting: Family Medicine

## 2023-07-25 DIAGNOSIS — R1012 Left upper quadrant pain: Secondary | ICD-10-CM

## 2023-07-25 LAB — TESTOSTERONE: Testosterone: 132 ng/dL — ABNORMAL LOW (ref 264–916)

## 2023-07-25 LAB — LIPID PANEL
Chol/HDL Ratio: 3.1 ratio (ref 0.0–5.0)
Cholesterol, Total: 190 mg/dL (ref 100–199)
HDL: 61 mg/dL (ref >39)
LDL Chol Calc (NIH): 118 mg/dL — ABNORMAL HIGH (ref 0–99)
Triglycerides: 57 mg/dL (ref 0–149)
VLDL Cholesterol Cal: 11 mg/dL (ref 5–40)

## 2023-07-25 LAB — PSA: Prostate Specific Ag, Serum: 0.5 ng/mL (ref 0.0–4.0)

## 2023-07-25 MED ORDER — TESTOSTERONE 12.5 MG/ACT (1%) TD GEL
2.0000 | Freq: Every day | TRANSDERMAL | 5 refills | Status: DC
Start: 2023-07-25 — End: 2023-11-29

## 2023-07-25 NOTE — Addendum Note (Signed)
 Addended by: Ronnald Nian on: 07/25/2023 09:20 AM   Modules accepted: Orders

## 2023-07-25 NOTE — Addendum Note (Signed)
 Addended by: Ronnald Nian on: 07/25/2023 09:18 AM   Modules accepted: Orders

## 2023-07-26 ENCOUNTER — Other Ambulatory Visit: Payer: Self-pay

## 2023-07-26 DIAGNOSIS — E118 Type 2 diabetes mellitus with unspecified complications: Secondary | ICD-10-CM

## 2023-07-26 MED ORDER — TIRZEPATIDE 5 MG/0.5ML ~~LOC~~ SOAJ: 5.0000 mg | SUBCUTANEOUS | 1 refills | Status: DC

## 2023-07-27 ENCOUNTER — Other Ambulatory Visit (HOSPITAL_COMMUNITY): Payer: Self-pay

## 2023-07-27 ENCOUNTER — Telehealth: Payer: Self-pay

## 2023-07-27 NOTE — Telephone Encounter (Signed)
 Pharmacy Patient Advocate Encounter  Received notification from [employer] that Prior Authorization for [facility] 5mg  has been APPROVED from 2.2.25 to 3.4.26. Ran test claim, Copay is $RTS, RX WAS LAST FILLED ON 3.5.25. This test claim was processed through [facility]- copay amounts may vary at other pharmacies due to pharmacy/plan contracts, or as the patient moves through the different stages of their insurance plan.   PA #/Case ID/Reference #: [id number]

## 2023-08-21 ENCOUNTER — Encounter: Payer: Self-pay | Admitting: Family Medicine

## 2023-08-21 DIAGNOSIS — E118 Type 2 diabetes mellitus with unspecified complications: Secondary | ICD-10-CM

## 2023-08-21 DIAGNOSIS — Z1211 Encounter for screening for malignant neoplasm of colon: Secondary | ICD-10-CM

## 2023-08-22 MED ORDER — TIRZEPATIDE 5 MG/0.5ML ~~LOC~~ SOAJ: 5.0000 mg | SUBCUTANEOUS | 1 refills | Status: DC

## 2023-08-29 ENCOUNTER — Other Ambulatory Visit

## 2023-08-29 DIAGNOSIS — E291 Testicular hypofunction: Secondary | ICD-10-CM

## 2023-08-30 ENCOUNTER — Encounter: Payer: Self-pay | Admitting: Family Medicine

## 2023-08-30 ENCOUNTER — Other Ambulatory Visit: Payer: Self-pay | Admitting: Family Medicine

## 2023-08-30 LAB — SPECIMEN STATUS REPORT

## 2023-08-30 LAB — TESTOSTERONE: Testosterone: 210 ng/dL — ABNORMAL LOW (ref 264–916)

## 2023-08-31 NOTE — Progress Notes (Signed)
 No show  This encounter was created in error - please disregard.

## 2023-09-01 ENCOUNTER — Ambulatory Visit: Attending: Cardiology | Admitting: Cardiovascular Disease

## 2023-09-04 ENCOUNTER — Other Ambulatory Visit: Payer: Self-pay

## 2023-09-04 ENCOUNTER — Encounter: Payer: Self-pay | Admitting: Cardiovascular Disease

## 2023-09-21 ENCOUNTER — Encounter: Payer: Self-pay | Admitting: Internal Medicine

## 2023-09-26 ENCOUNTER — Other Ambulatory Visit

## 2023-09-28 ENCOUNTER — Other Ambulatory Visit

## 2023-10-12 ENCOUNTER — Telehealth: Payer: Self-pay

## 2023-10-12 NOTE — Telephone Encounter (Signed)
 Dr. Rosaline Coma,   Pts BMI > 50. Please advise OV vs direct to the hospital.   Will keep PV apt pending response. Colonoscopy has been cancelled for [facility].   Thank you, PV

## 2023-10-13 NOTE — Telephone Encounter (Signed)
 Pt made aware.  Pt was scheduled to see Dr Rosaline Coma on 10/18/2023 at 10:50 AM. Pt made aware. Pt verbalized understanding with all questions answered.  Previsit appointment canceled. Pt made aware.

## 2023-10-13 NOTE — Telephone Encounter (Signed)
 Left message for pt to call back

## 2023-10-18 ENCOUNTER — Encounter: Payer: Self-pay | Admitting: Internal Medicine

## 2023-10-18 ENCOUNTER — Ambulatory Visit: Admitting: Internal Medicine

## 2023-10-18 VITALS — BP 142/100 | HR 95 | Ht 77.0 in | Wt >= 6400 oz

## 2023-10-18 DIAGNOSIS — Z1211 Encounter for screening for malignant neoplasm of colon: Secondary | ICD-10-CM

## 2023-10-18 DIAGNOSIS — K5900 Constipation, unspecified: Secondary | ICD-10-CM | POA: Diagnosis not present

## 2023-10-18 MED ORDER — NA SULFATE-K SULFATE-MG SULF 17.5-3.13-1.6 GM/177ML PO SOLN
ORAL | 0 refills | Status: DC
Start: 2023-10-18 — End: 2023-12-12

## 2023-10-18 NOTE — Progress Notes (Signed)
 Chief Complaint: Colon cancer screening  HPI : 53 year old male with history of DM, obesity, asthma, nephrolithiasis presents to discuss colon cancer screening  He has never had a colonoscopy before. He did do a Cologuard over 5 years ago that was negative. Denies blood in the stools. He used to be pretty regular and now he is more constipated. He has one BM every 2-3 days. Denies unintentional weight loss. Denies family history of colon cancer. Denies N&V, dysphagia, chest burning, or regurgitation. He had a food impaction in 2016 after eating some meat. Following this episode, he had not had any further food impactions and is more careful when eating his foods.   Past Medical History:  Diagnosis Date   Allergic rhinitis    Asthma    Diabetes mellitus    Hypertension    Hypogonadism male    Meniscus tear    rt knee   Obesity    Renal disorder    Stones in the urinary tract     Past Surgical History:  Procedure Laterality Date   ESOPHAGOGASTRODUODENOSCOPY N/A 11/16/2014   Procedure: ESOPHAGOGASTRODUODENOSCOPY (EGD);  Surgeon: Jolinda Necessary, MD;  Location[facility] ENDOSCOPY;  Service: Endoscopy;  Laterality: N/A;   KNEE ARTHROSCOPY  02/13/2012   Procedure: ARTHROSCOPY KNEE;  Surgeon: Boston Byers, MD;  Location: [facility] OR;  Service: Orthopedics;  Laterality: Right;   Family History  Problem Relation Age of Onset   Diabetes Mother    Social History   Tobacco Use   Smoking status: Never   Smokeless tobacco: Never  Substance Use Topics   Alcohol use: No   Drug use: No   Current Outpatient Medications  Medication Sig Dispense Refill   Alcohol Swabs (ALCOHOL PREP) PADS 1 each by Does not apply route 2 (two) times daily.     amLODipine  (NORVASC ) 10 MG tablet Take 1 tablet (10 mg total) by mouth daily. 90 tablet 3   Blood Glucose Monitoring Suppl DEVI Test 1-2 times day. Pend on Insurance 1 each 0   carvedilol  (COREG ) 25 MG tablet Take 1 tablet (25 mg total) by mouth 2 (two) times  daily with a meal. 180 tablet 3   ezetimibe  (ZETIA ) 10 MG tablet Take 1 tablet (10 mg total) by mouth daily. 90 tablet 1   Glucose Blood (BLOOD GLUCOSE TEST STRIPS) STRP Test 1-2 times daily 100 strip 5   Lancet Device MISC 1-2 times daily 1 each 0   Lancets Misc. MISC 1-2 times a day 100 each 5   losartan -hydrochlorothiazide (HYZAAR) 100-12.5 MG tablet Take 1 tablet by mouth daily. 90 tablet 3   metFORMIN  (GLUCOPHAGE ) 500 MG tablet Take 1 tablet (500 mg total) by mouth 2 (two) times daily with a meal. 180 tablet 1   naproxen  sodium (ALEVE ) 220 MG tablet Take 660 mg by mouth.     pantoprazole  (PROTONIX ) 40 MG tablet Take 1 tablet (40 mg total) by mouth 2 (two) times daily before a meal. 60 tablet 1   rosuvastatin  (CRESTOR ) 40 MG tablet Take 1 tablet (40 mg total) by mouth daily. 90 tablet 3   tadalafil  (CIALIS ) 20 MG tablet TAKE 1 TABLET DAILY AS NEEDED FOR ERECTILE DYSFUNCTION 30 tablet 3   Testosterone  12.5 MG/ACT (1%) GEL Place 2 Pump onto the skin daily. 75 g 5   tirzepatide (MOUNJARO) 5 MG/0.5ML Pen Inject 5 mg into the skin once a week. 6 mL 1   TRUEPLUS LANCETS 30G MISC USE AS DIRECTED TO TEST TWICE  DAILY 100 each 3   No current facility-administered medications for this visit.   Allergies  Allergen Reactions   Lisinopril Shortness Of Breath and Swelling   Review of Systems: All systems reviewed and negative except where noted in HPI.   Physical Exam: BP (!) 142/100   Pulse 95   Ht 6\' 5"  (1.956 m)   Wt (!) 423 lb 9.6 oz (192.1 kg)   BMI 50.23 kg/m  Constitutional: Pleasant,well-developed, male in no acute distress. HEENT: Normocephalic and atraumatic. Conjunctivae are normal. No scleral icterus. Cardiovascular: Normal rate, regular rhythm.  Pulmonary/chest: Effort normal and breath sounds normal. No wheezing, rales or rhonchi. Abdominal: Soft, nondistended, nontender. Bowel sounds active throughout. There are no masses palpable. No hepatomegaly. Extremities: No  edema Neurological: Alert and oriented to person place and time. Skin: Skin is warm and dry. No rashes noted. Psychiatric: Normal mood and affect. Behavior is normal.  Labs 02/2023: CBC nml. CMP with elevated glucose of 144. HbA1C 6.6%.  EGD 11/16/14: ENDOSCOPIC IMPRESSION: 1. There was a large amount of residual food seen at the gastroesophageal junction. the food impaction was removed with multiple passages of the scope. The patient tolerated the procedure fairly well and there were no immediate complications 2. Normal 3. Normal  CTA C/A/P w/contrast 01/20/22: IMPRESSION: 1. No evidence for aortic dissection or aneurysm. 2. No acute process in the chest, abdomen or pelvis. 3. Borderline cardiomegaly. 4. Colonic diverticulosis. 5. Moderate-sized fat containing umbilical hernia.  ASSESSMENT AND PLAN: Colon cancer screening Constipation History of food impaction Patient presents to discuss getting a colonoscopy for colon cancer screening. He has never had a colonoscopy in the past but did do a Cologuard test that was negative 5 years ago. I went over the risks and benefits in detail with the patient, and he would be interested in doing the colonoscopy procedure. He does have a history of a food impaction, though has had no further dysphagia following that one episode. He was not noted to have any esophageal strictures during his last EGD. - Colonoscopy [facility] due to BMI>50 with 2-day bowel prep  Regino Caprio, MD  I spent 45 minutes of time, including in depth chart review, independent review of results as outlined above, communicating results with the patient directly, face-to-face time with the patient, coordinating care, ordering studies and medications as appropriate, and documentation.

## 2023-10-18 NOTE — Patient Instructions (Addendum)
 You have been scheduled for a colonoscopy. Please follow written instructions given to you at your visit today.   If you use inhalers (even only as needed), please bring them with you on the day of your procedure.  DO NOT TAKE 7 DAYS PRIOR TO TEST- Trulicity  (dulaglutide ) Ozempic, Wegovy (semaglutide) Mounjaro (tirzepatide) Bydureon Bcise (exanatide extended release)  DO NOT TAKE 1 DAY PRIOR TO YOUR TEST Rybelsus (semaglutide) Adlyxin (lixisenatide) Victoza (liraglutide) Byetta (exanatide) ___________________________________________________________________________   We have sent the following medications to your pharmacy for you to pick up at your convenience: Suprep  _______________________________________________________  If your blood pressure at your visit was 140/90 or greater, please contact your primary care physician to follow up on this.  _______________________________________________________  If you are age 53 or older, your body mass index should be between 23-30. Your Body mass index is 50.23 kg/m. If this is out of the aforementioned range listed, please consider follow up with your Primary Care Provider.  If you are age 53 or younger, your body mass index should be between 19-25. Your Body mass index is 50.23 kg/m. If this is out of the aformentioned range listed, please consider follow up with your Primary Care Provider.   ________________________________________________________  The [facility] providers would like to encourage you to use MYCHART to communicate with providers for non-urgent requests or questions.  Due to long hold times on the telephone, sending your provider a message by [facility] may be a faster and more efficient way to get a response.  Please allow 48 business hours for a response.  Please remember that this is for non-urgent requests.  _______________________________________________________  Due to recent changes in healthcare laws, you may see  the results of your imaging and laboratory studies on MyChart before your provider has had a chance to review them.  We understand that in some cases there may be results that are confusing or concerning to you. Not all laboratory results come back in the same time frame and the provider may be waiting for multiple results in order to interpret others.  Please give us  48 hours in order for your provider to thoroughly review all the results before contacting the office for clarification of your results.   Thank you for entrusting me with your care and for choosing [facility], Dr. Regino Caprio

## 2023-10-24 ENCOUNTER — Encounter

## 2023-11-07 ENCOUNTER — Encounter: Admitting: Internal Medicine

## 2023-11-28 ENCOUNTER — Encounter: Payer: Self-pay | Admitting: Internal Medicine

## 2023-11-28 ENCOUNTER — Encounter: Payer: Self-pay | Admitting: Family Medicine

## 2023-11-28 ENCOUNTER — Ambulatory Visit: Admitting: Family Medicine

## 2023-11-28 VITALS — BP 124/80 | HR 86 | Wt >= 6400 oz

## 2023-11-28 DIAGNOSIS — E118 Type 2 diabetes mellitus with unspecified complications: Secondary | ICD-10-CM | POA: Diagnosis not present

## 2023-11-28 DIAGNOSIS — Z23 Encounter for immunization: Secondary | ICD-10-CM

## 2023-11-28 DIAGNOSIS — E291 Testicular hypofunction: Secondary | ICD-10-CM

## 2023-11-28 DIAGNOSIS — E1159 Type 2 diabetes mellitus with other circulatory complications: Secondary | ICD-10-CM

## 2023-11-28 DIAGNOSIS — E1169 Type 2 diabetes mellitus with other specified complication: Secondary | ICD-10-CM | POA: Diagnosis not present

## 2023-11-28 DIAGNOSIS — E785 Hyperlipidemia, unspecified: Secondary | ICD-10-CM

## 2023-11-28 DIAGNOSIS — I152 Hypertension secondary to endocrine disorders: Secondary | ICD-10-CM

## 2023-11-28 LAB — POCT GLYCOSYLATED HEMOGLOBIN (HGB A1C): Hemoglobin A1C: 6.7 % — AB (ref 4.0–5.6)

## 2023-11-28 MED ORDER — TIRZEPATIDE 5 MG/0.5ML ~~LOC~~ SOAJ: 5.0000 mg | SUBCUTANEOUS | 1 refills | Status: DC

## 2023-11-28 NOTE — Progress Notes (Signed)
 Subjective:    Patient ID: Guy Taylor, male    DOB: 1971-03-08, 52 y.o.   MRN: [id number]  Guy Taylor is a 53 y.o. male who presents for follow-up of Type 2 diabetes mellitus.  Home blood sugar records: 150-220 Current symptoms/problems include none and have been unchanged. Daily foot checks: yes   Any foot concerns: no How often blood sugars checked: sometimes twice a day sometimes once.  Exercise: The patient does not participate in regular exercise at present. Diet: no diet restrictions.  He does note that since starting his injection with Mounjaro  he has noted decrease in food noise.  He also gets full very quickly and at the present time says he is eating just 1 meal per day.  He continues on testosterone  and has bumped this up to 4 pumps per day but still feels fatigue and no energy.  He continues on his Coreg , losartan  as well as Zetia  The following portions of the patient's history were reviewed and updated as appropriate: allergies, current medications, past medical history, past social history and problem list.  ROS as in subjective above.     Objective:    Physical Exam Alert and in no distress otherwise not examined. 17 pound weight loss. Hemoglobin A1c hemoglobin A1c is 6.7 Lab Review    Latest Ref Rng & Units 11/28/2023   10:13 AM 07/24/2023    8:53 AM 07/24/2023    8:21 AM 03/21/2023    4:35 PM 03/21/2023    4:16 PM  Diabetic Labs  HbA1c 4.0 - 5.6 % 6.7   6.9     Microalbumin mg/L    28.2    Micro/Creat Ratio     12.5    Chol 100 - 199 mg/dL  809    812   HDL >60 mg/dL  61    59   Calc LDL 0 - 99 mg/dL  881    887   Triglycerides 0 - 149 mg/dL  57    85   Creatinine 0.76 - 1.27 mg/dL     8.96       06/23/7972    8:14 AM 10/18/2023   11:55 AM 10/18/2023   10:54 AM 07/24/2023    8:07 AM 03/21/2023    3:22 PM  BP/Weight  Systolic BP 124 142 142 138 130  Diastolic BP 80 100 100 86 80  Wt. (Lbs) 419.6  423.6 436.6 420.6  BMI 49.76 kg/m2  50.23 kg/m2 51.77  kg/m2 48.61 kg/m2      07/24/2023    8:15 AM 05/10/2022    2:45 PM  Foot/eye exam completion dates  Foot Form Completion Done Done    [name]  reports that he has never smoked. He has never used smokeless tobacco. He reports that he does not drink alcohol and does not use drugs.     Assessment & Plan:    Type 2 diabetes mellitus with complication, without long-term current use of insulin (HCC) - Plan: POCT glycosylated hemoglobin (Hb A1C), tirzepatide  (MOUNJARO ) 5 MG/0.5ML Pen  Morbid obesity (HCC)  Hypertension associated with diabetes (HCC)  Hyperlipidemia associated with type 2 diabetes mellitus (HCC)  Hypogonadism male - Plan: Testosterone   Need for shingles vaccine - Plan: Zoster Recombinant (Shingrix  )  Rx changes: none he seems to be doing well on his present medication regimen.  I might change his testosterone  dosing based on blood work Education: Reviewed 'ABCs' of diabetes management (respective goals in parentheses):  A1C (<7), blood pressure (<130/80),  and cholesterol (LDL <100). Compliance at present is estimated to be good. Efforts to improve compliance (if necessary) will be directed at nothing at the present time. Follow up: 4 months

## 2023-11-29 ENCOUNTER — Ambulatory Visit: Payer: Self-pay | Admitting: Family Medicine

## 2023-11-29 DIAGNOSIS — N529 Male erectile dysfunction, unspecified: Secondary | ICD-10-CM

## 2023-11-29 LAB — TESTOSTERONE: Testosterone: 173 ng/dL — ABNORMAL LOW (ref 264–916)

## 2023-11-29 MED ORDER — TESTOSTERONE 12.5 MG/ACT (1%) TD GEL: 6.0000 | Freq: Every day | TRANSDERMAL | 5 refills | Status: AC

## 2023-11-29 NOTE — Addendum Note (Signed)
 Addended by: JOYCE NORLEEN BROCKS on: 11/29/2023 08:17 AM   Modules accepted: Orders

## 2023-12-04 ENCOUNTER — Encounter (HOSPITAL_COMMUNITY): Payer: Self-pay | Admitting: Internal Medicine

## 2023-12-04 NOTE — Progress Notes (Signed)
 Guy Taylor   PCP-Lalonde MD Cardiologist-Nahser Pulmonologist-n/a   EKG-01/26/22 Echo-n/a Cath-n/a Stress-n/a ICD/PM- n/a GLP1-Mounjaro  last dose 7/13 hold 1 week Blood Thinner- n/a  HX: DM,HTN,Asthma,Renal Disorder. Last visit with primary was 7/825. Does see a cardiologist last saw them 08/12/22 and was due to see them again in april but couldn't make appt, has to call them to make another appt. Patient denies any breathing/cardiac issues.  Anesthesia Review- Yes- stable at last visit treating for high cholesterol, okay to proceed

## 2023-12-07 ENCOUNTER — Telehealth: Payer: Self-pay

## 2023-12-07 NOTE — Telephone Encounter (Signed)
 Procedure:COLONOSCOPY Procedure date: 12/12/23 Procedure location: [facility] Arrival Time: 7:30 Spoke with the patient Y/N: Y Any prep concerns? N  Has the patient obtained the prep from the pharmacy ? Y Do you have a care partner and transportation: Y Any additional concerns? N

## 2023-12-12 ENCOUNTER — Encounter (HOSPITAL_COMMUNITY)
Admission: RE | Disposition: A | Discharge status: Home / Self Care | Payer: Self-pay | Source: Home / Self Care | Attending: Internal Medicine

## 2023-12-12 ENCOUNTER — Ambulatory Visit (HOSPITAL_COMMUNITY)
Admission: RE | Admit: 2023-12-12 | Discharge: 2023-12-12 | Disposition: A | Discharge status: Home / Self Care | Attending: Internal Medicine | Admitting: Internal Medicine

## 2023-12-12 ENCOUNTER — Other Ambulatory Visit: Payer: Self-pay

## 2023-12-12 ENCOUNTER — Ambulatory Visit (HOSPITAL_BASED_OUTPATIENT_CLINIC_OR_DEPARTMENT_OTHER): Payer: Self-pay | Admitting: Physician Assistant

## 2023-12-12 ENCOUNTER — Ambulatory Visit (HOSPITAL_COMMUNITY): Payer: Self-pay | Admitting: Physician Assistant

## 2023-12-12 DIAGNOSIS — Z1211 Encounter for screening for malignant neoplasm of colon: Secondary | ICD-10-CM

## 2023-12-12 DIAGNOSIS — E119 Type 2 diabetes mellitus without complications: Secondary | ICD-10-CM | POA: Insufficient documentation

## 2023-12-12 DIAGNOSIS — I10 Essential (primary) hypertension: Secondary | ICD-10-CM

## 2023-12-12 DIAGNOSIS — Z6841 Body Mass Index (BMI) 40.0 and over, adult: Secondary | ICD-10-CM | POA: Insufficient documentation

## 2023-12-12 DIAGNOSIS — Z7984 Long term (current) use of oral hypoglycemic drugs: Secondary | ICD-10-CM | POA: Diagnosis not present

## 2023-12-12 DIAGNOSIS — K6389 Other specified diseases of intestine: Secondary | ICD-10-CM | POA: Diagnosis not present

## 2023-12-12 DIAGNOSIS — D122 Benign neoplasm of ascending colon: Secondary | ICD-10-CM | POA: Diagnosis not present

## 2023-12-12 DIAGNOSIS — J45909 Unspecified asthma, uncomplicated: Secondary | ICD-10-CM | POA: Diagnosis not present

## 2023-12-12 DIAGNOSIS — K648 Other hemorrhoids: Secondary | ICD-10-CM | POA: Insufficient documentation

## 2023-12-12 DIAGNOSIS — Z7985 Long-term (current) use of injectable non-insulin antidiabetic drugs: Secondary | ICD-10-CM | POA: Diagnosis not present

## 2023-12-12 DIAGNOSIS — K5730 Diverticulosis of large intestine without perforation or abscess without bleeding: Secondary | ICD-10-CM | POA: Insufficient documentation

## 2023-12-12 DIAGNOSIS — E669 Obesity, unspecified: Secondary | ICD-10-CM | POA: Insufficient documentation

## 2023-12-12 DIAGNOSIS — D123 Benign neoplasm of transverse colon: Secondary | ICD-10-CM

## 2023-12-12 DIAGNOSIS — K219 Gastro-esophageal reflux disease without esophagitis: Secondary | ICD-10-CM | POA: Insufficient documentation

## 2023-12-12 DIAGNOSIS — D175 Benign lipomatous neoplasm of intra-abdominal organs: Secondary | ICD-10-CM | POA: Insufficient documentation

## 2023-12-12 DIAGNOSIS — K5900 Constipation, unspecified: Secondary | ICD-10-CM

## 2023-12-12 HISTORY — PX: COLONOSCOPY: SHX5424

## 2023-12-12 LAB — GLUCOSE, CAPILLARY: Glucose-Capillary: 103 mg/dL — ABNORMAL HIGH (ref 70–99)

## 2023-12-12 SURGERY — COLONOSCOPY
Anesthesia: Monitor Anesthesia Care

## 2023-12-12 MED ORDER — LACTATED RINGERS IV SOLN: INTRAVENOUS | Status: DC

## 2023-12-12 MED ORDER — CARVEDILOL 25 MG PO TABS
25.0000 mg | ORAL_TABLET | Freq: Once | ORAL | Status: AC
  Administered 2023-12-12: 25 mg via ORAL
  Filled 2023-12-12: qty 1

## 2023-12-12 MED ORDER — AMLODIPINE BESYLATE 10 MG PO TABS
10.0000 mg | ORAL_TABLET | Freq: Once | ORAL | Status: AC
  Administered 2023-12-12: 10 mg via ORAL
  Filled 2023-12-12: qty 1

## 2023-12-12 MED ORDER — PROPOFOL 10 MG/ML IV BOLUS
INTRAVENOUS | Status: DC | PRN
  Administered 2023-12-12: 40 mg via INTRAVENOUS
  Administered 2023-12-12: 180 ug/kg/min via INTRAVENOUS

## 2023-12-12 MED ORDER — SODIUM CHLORIDE 0.9 % IV SOLN: INTRAVENOUS | Status: DC

## 2023-12-12 NOTE — Discharge Instructions (Signed)

## 2023-12-12 NOTE — Anesthesia Procedure Notes (Signed)
 Date/Time: 12/12/2023 9:30 AM  Performed by: Arnell Mausolf R, CRNAOxygen Delivery Method: Supernova nasal CPAP Preoxygenation: Pre-oxygenation with 100% oxygen Induction Type: IV induction

## 2023-12-12 NOTE — Transfer of Care (Signed)
 Immediate Anesthesia Transfer of Care Note  Patient: Guy Taylor  Procedure(s) Performed: COLONOSCOPY  Patient Location: PACU  Anesthesia Type:MAC  Level of Consciousness: sedated  Airway & Oxygen Therapy: Patient Spontanous Breathing  Post-op Assessment: Report given to RN  Post vital signs: Reviewed and stable  Last Vitals:  Vitals Value Taken Time  BP    Temp    Pulse    Resp    SpO2      Last Pain:  Vitals:   12/12/23 0741  TempSrc: (P) Temporal  PainSc: (P) 0-No pain         Complications: No notable events documented.

## 2023-12-12 NOTE — H&P (Signed)
 GASTROENTEROLOGY PROCEDURE H&P NOTE   Primary Care Physician: Joyce Norleen BROCKS, MD    Reason for Procedure:   Colon cancer screening  Plan:    Colonoscopy  Patient is appropriate for endoscopic procedure(s) in the ambulatory (hospital) setting.  The nature of the procedure, as well as the risks, benefits, and alternatives were carefully and thoroughly reviewed with the patient. Ample time for discussion and questions allowed. The patient understood, was satisfied, and agreed to proceed.     HPI: Guy Taylor is a 53 y.o. male who presents for colonoscopy for evaluation of colon cancer screening.  Patient was most recently seen in the Gastroenterology Clinic on 10/18/23.  No interval change in medical history since that appointment. Please refer to that note for full details regarding GI history and clinical presentation.   Past Medical History:  Diagnosis Date   Allergic rhinitis    Asthma    Diabetes mellitus    Hypertension    Hypogonadism male    Meniscus tear    rt knee   Obesity    Renal disorder    Stones in the urinary tract     Past Surgical History:  Procedure Laterality Date   ESOPHAGOGASTRODUODENOSCOPY N/A 11/16/2014   Procedure: ESOPHAGOGASTRODUODENOSCOPY (EGD);  Surgeon: Lynwood Bohr, MD;  Location: [facility] ENDOSCOPY;  Service: Endoscopy;  Laterality: N/A;   KNEE ARTHROSCOPY  02/13/2012   Procedure: ARTHROSCOPY KNEE;  Surgeon: Norleen LITTIE Gavel, MD;  Location: [facility] OR;  Service: Orthopedics;  Laterality: Right;    Prior to Admission medications   Medication Sig Start Date End Date Taking? Authorizing Provider  pantoprazole  (PROTONIX ) 40 MG tablet Take 1 tablet (40 mg total) by mouth 2 (two) times daily before a meal. 05/10/22  Yes Joyce Norleen BROCKS, MD  Testosterone  12.5 MG/ACT (1%) GEL Place 6 Pump onto the skin daily. 11/29/23  Yes Joyce Norleen BROCKS, MD  Alcohol Swabs (ALCOHOL PREP) PADS 1 each by Does not apply route 2 (two) times daily.    [provider]   amLODipine  (NORVASC ) 10 MG tablet Take 1 tablet (10 mg total) by mouth daily. 07/24/23   Lalonde, John C, MD  Blood Glucose Monitoring Suppl DEVI Test 1-2 times day. Pend on Insurance 03/21/23   Joyce Norleen BROCKS, MD  carvedilol  (COREG ) 25 MG tablet Take 1 tablet (25 mg total) by mouth 2 (two) times daily with a meal. 07/24/23   Joyce Norleen BROCKS, MD  ezetimibe  (ZETIA ) 10 MG tablet Take 1 tablet (10 mg total) by mouth daily. 07/24/23   Joyce Norleen BROCKS, MD  Glucose Blood (BLOOD GLUCOSE TEST STRIPS) STRP Test 1-2 times daily 03/21/23   Joyce Norleen BROCKS, MD  Lancet Device MISC 1-2 times daily 03/21/23   Joyce Norleen BROCKS, MD  Lancets Misc. MISC 1-2 times a day 03/21/23   Joyce Norleen BROCKS, MD  losartan -hydrochlorothiazide (HYZAAR) 100-12.5 MG tablet Take 1 tablet by mouth daily. 07/24/23   Joyce Norleen BROCKS, MD  metFORMIN  (GLUCOPHAGE ) 500 MG tablet Take 1 tablet (500 mg total) by mouth 2 (two) times daily with a meal. 07/24/23   Joyce Norleen BROCKS, MD  Na Sulfate-K Sulfate-Mg Sulfate concentrate (SUPREP) 17.5-3.13-1.6 GM/177ML SOLN Use as directed; may use generic; goodrx card if insurance will not cover generic 10/18/23   Federico Rosario BROCKS, MD  naproxen  sodium (ALEVE ) 220 MG tablet Take 660 mg by mouth.    [provider]  rosuvastatin  (CRESTOR ) 40 MG tablet Take 1 tablet (40 mg total) by mouth daily.  03/21/23   Joyce Norleen BROCKS, MD  tadalafil  (CIALIS ) 20 MG tablet TAKE 1 TABLET DAILY AS NEEDED FOR ERECTILE DYSFUNCTION 07/24/23   Joyce Norleen BROCKS, MD  tirzepatide  (MOUNJARO ) 5 MG/0.5ML Pen Inject 5 mg into the skin once a week. 11/28/23   Joyce Norleen BROCKS, MD  TRUEPLUS LANCETS 30G MISC USE AS DIRECTED TO TEST TWICE DAILY 02/25/16   Joyce Norleen BROCKS, MD    Current Facility-Administered Medications  Medication Dose Route Frequency Provider Last Rate Last Admin   0.9 %  sodium chloride  infusion   Intravenous Continuous Federico Rosario BROCKS, MD       lactated ringers  infusion   Intravenous Continuous Federico Rosario BROCKS, MD 10 mL/hr at  12/12/23 0819 New Bag at 12/12/23 0819    Allergies as of 10/18/2023 - Review Complete 10/18/2023  Allergen Reaction Noted   Lisinopril Shortness Of Breath and Swelling 08/20/2010    Family History  Problem Relation Age of Onset   Diabetes Mother     Social History   Socioeconomic History   Marital status: Married    Spouse name: Not on file   Number of children: Not on file   Years of education: Not on file   Highest education level: Not on file  Occupational History   Not on file  Tobacco Use   Smoking status: Never   Smokeless tobacco: Never  Substance and Sexual Activity   Alcohol use: No   Drug use: No   Sexual activity: Yes  Other Topics Concern   Not on file  Social History Narrative   Works for [employer] and [employer], Games developer.   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   Social Drivers of Corporate investment banker Strain: Not on file  Food Insecurity: Not on file  Transportation Needs: Not on file  Physical Activity: Not on file  Stress: Not on file  Social Connections: Not on file  Intimate Partner Violence: Not on file    Physical Exam: Vital signs in last 24 hours: BP (!) (P) 194/96   Pulse (P) 73   Temp (!) (P) 97.1 F (36.2 C) (Temporal)   Resp (!) (P) 25   Ht (P) 6' 6 (1.981 m)   Wt (!) (P) 189.1 kg   SpO2 (P) 97%   BMI (P) 48.19 kg/m  GEN: NAD EYE: Sclerae anicteric ENT: MMM CV: Non-tachycardic Pulm: No increased WOB GI: Soft NEURO:  Alert & Oriented   Estefana Federico, MD [facility]   12/12/2023 8:31 AM

## 2023-12-12 NOTE — Op Note (Signed)
 [facility] Patient Name: Guy Taylor Procedure Date: 12/12/2023 MRN: [id number] Attending MD: Rosario Estefana Kidney , , [id number] Date of Birth: 1971/04/20 CSN: [id number] Age: 53 Admit Type: Outpatient Procedure:                Colonoscopy Indications:              Screening for colorectal malignant neoplasm, This                            is the patient's first colonoscopy Providers:                Rosario Estefana Kidney Particia Bernardo, RN, Corene Southgate, Technician Referring MD:             Norleen KYM Jobs, MD Medicines:                Monitored Anesthesia Care Complications:            No immediate complications. Estimated Blood Loss:     Estimated blood loss was minimal. Procedure:                Pre-Anesthesia Assessment:                           - Prior to the procedure, a History and Physical                            was performed, and patient medications and                            allergies were reviewed. The patient's tolerance of                            previous anesthesia was also reviewed. The risks                            and benefits of the procedure and the sedation                            options and risks were discussed with the patient.                            All questions were answered, and informed consent                            was obtained. Prior Anticoagulants: The patient has                            taken no anticoagulant or antiplatelet agents. ASA                            Grade Assessment: III - A patient with severe  systemic disease. After reviewing the risks and                            benefits, the patient was deemed in satisfactory                            condition to undergo the procedure.                           After obtaining informed consent, the colonoscope                            was passed under direct vision. Throughout the                             procedure, the patient's blood pressure, pulse, and                            oxygen saturations were monitored continuously. The                            CF-HQ190L ([id number] Olympus colonoscope was                            introduced through the anus and advanced to the the                            cecum, identified by appendiceal orifice and                            ileocecal valve. The colonoscopy was performed                            without difficulty. The patient tolerated the                            procedure well. The quality of the bowel                            preparation was excellent. The terminal ileum,                            ileocecal valve, appendiceal orifice, and rectum                            were photographed. Scope In: 9:39:15 AM Scope Out: 9:56:10 AM Scope Withdrawal Time: 0 hours 12 minutes 5 seconds  Total Procedure Duration: 0 hours 16 minutes 55 seconds  Findings:      Multiple diverticula were found in the entire colon.      One medium submucosal nodule (suspected lipoma) was found in the       ascending colon. Biopsies were taken with a cold forceps for histology.      A 2 mm polyp was found in the transverse colon. The polyp was sessile.  The polyp was removed with a cold biopsy forceps. Resection and       retrieval were complete.      Non-bleeding internal hemorrhoids were found during retroflexion. Impression:               - Diverticulosis in the entire examined colon.                           - Submucosal nodule (suspected lipoma) in the                            ascending colon. Biopsied.                           - One 2 mm polyp in the transverse colon, removed                            with a cold biopsy forceps. Resected and retrieved.                           - Non-bleeding internal hemorrhoids. Moderate Sedation:      Not Applicable - Patient had care per Anesthesia. Recommendation:           -  Discharge patient to home (with escort).                           - Await pathology results.                           - The findings and recommendations were discussed                            with the patient. Procedure Code(s):        --- Professional ---                           [phone], Colonoscopy, flexible; with biopsy, single                            or multiple Diagnosis Code(s):        --- Professional ---                           Z12.11, Encounter for screening for malignant                            neoplasm of colon                           K64.8, Other hemorrhoids                           K63.89, Other specified diseases of intestine                           D12.3, Benign neoplasm of transverse colon (hepatic  flexure or splenic flexure)                           K57.30, Diverticulosis of large intestine without                            perforation or abscess without bleeding CPT copyright 2022 American Medical Association. All rights reserved. The codes documented in this report are preliminary and upon coder review may  be revised to meet current compliance requirements. Dr Estefana Federico Rosario Estefana Federico,  12/12/2023 10:01:17 AM Number of Addenda: 0

## 2023-12-12 NOTE — Anesthesia Preprocedure Evaluation (Addendum)
 Anesthesia Evaluation  Patient identified by MRN, date of birth, ID band Patient awake    Reviewed: Allergy & Precautions, NPO status , Patient's Chart, lab work & pertinent test results, reviewed documented beta blocker date and time   History of Anesthesia Complications Negative for: history of anesthetic complications  Airway Mallampati: I  TM Distance: >3 FB Neck ROM: Full    Dental  (+) Dental Advisory Given   Pulmonary neg pulmonary ROS   breath sounds clear to auscultation       Cardiovascular hypertension, Pt. on medications and Pt. on home beta blockers (-) angina  Rhythm:Regular Rate:Normal     Neuro/Psych negative neurological ROS     GI/Hepatic Neg liver ROS,GERD  Medicated and Controlled,,  Endo/Other  diabetes (glu 103), Oral Hypoglycemic Agents  Class 4 obesityMounjaro: 2 weeks ago  Renal/GU H/o stones     Musculoskeletal   Abdominal   Peds  Hematology   Anesthesia Other Findings   Reproductive/Obstetrics                              Anesthesia Physical Anesthesia Plan  ASA: 3  Anesthesia Plan: MAC   Post-op Pain Management: Minimal or no pain anticipated   Induction:   PONV Risk Score and Plan: 1 and Treatment may vary due to age or medical condition  Airway Management Planned: Natural Airway and Simple Face Mask  Additional Equipment: None  Intra-op Plan:   Post-operative Plan:   Informed Consent: I have reviewed the patients History and Physical, chart, labs and discussed the procedure including the risks, benefits and alternatives for the proposed anesthesia with the patient or authorized representative who has indicated his/her understanding and acceptance.     Dental advisory given  Plan Discussed with: CRNA and Surgeon  Anesthesia Plan Comments:          Anesthesia Quick Evaluation

## 2023-12-12 NOTE — Anesthesia Postprocedure Evaluation (Signed)
 Anesthesia Post Note  Patient: Guy Taylor  Procedure(s) Performed: COLONOSCOPY     Patient location during evaluation: Endoscopy Anesthesia Type: MAC Level of consciousness: oriented, patient cooperative and awake and alert Pain management: pain level controlled Vital Signs Assessment: post-procedure vital signs reviewed and stable Respiratory status: spontaneous breathing, nonlabored ventilation and respiratory function stable Cardiovascular status: blood pressure returned to baseline and stable Postop Assessment: no apparent nausea or vomiting and able to ambulate Anesthetic complications: no   No notable events documented.  Last Vitals:  Vitals:   12/12/23 1012 12/12/23 1022  BP: (!) 149/106 133/80  Pulse: 78 75  Resp: (!) 24 (!) 23  Temp:    SpO2: 97% 95%    Last Pain:  Vitals:   12/12/23 1022  TempSrc:   PainSc: 0-No pain                 Diksha Tagliaferro,E. Isabelle Matt

## 2023-12-14 ENCOUNTER — Encounter (HOSPITAL_COMMUNITY): Payer: Self-pay | Admitting: Internal Medicine

## 2023-12-15 ENCOUNTER — Ambulatory Visit: Payer: Self-pay | Admitting: Internal Medicine

## 2023-12-15 DIAGNOSIS — Z860101 Personal history of adenomatous and serrated colon polyps: Secondary | ICD-10-CM

## 2023-12-15 LAB — SURGICAL PATHOLOGY

## 2023-12-19 DIAGNOSIS — Z860101 Personal history of adenomatous and serrated colon polyps: Secondary | ICD-10-CM | POA: Insufficient documentation

## 2024-01-08 ENCOUNTER — Other Ambulatory Visit

## 2024-01-22 ENCOUNTER — Other Ambulatory Visit: Payer: Self-pay | Admitting: Family Medicine

## 2024-01-22 DIAGNOSIS — E118 Type 2 diabetes mellitus with unspecified complications: Secondary | ICD-10-CM

## 2024-02-28 ENCOUNTER — Other Ambulatory Visit: Payer: Self-pay | Admitting: Family Medicine

## 2024-02-28 DIAGNOSIS — E118 Type 2 diabetes mellitus with unspecified complications: Secondary | ICD-10-CM

## 2024-04-02 ENCOUNTER — Encounter: Payer: Self-pay | Admitting: Family Medicine

## 2024-04-02 ENCOUNTER — Ambulatory Visit: Payer: Self-pay | Admitting: Family Medicine

## 2024-04-02 VITALS — BP 114/74 | HR 100 | Ht 77.0 in | Wt >= 6400 oz

## 2024-04-02 DIAGNOSIS — E118 Type 2 diabetes mellitus with unspecified complications: Secondary | ICD-10-CM | POA: Diagnosis not present

## 2024-04-02 DIAGNOSIS — E1159 Type 2 diabetes mellitus with other circulatory complications: Secondary | ICD-10-CM

## 2024-04-02 DIAGNOSIS — E1169 Type 2 diabetes mellitus with other specified complication: Secondary | ICD-10-CM

## 2024-04-02 DIAGNOSIS — E291 Testicular hypofunction: Secondary | ICD-10-CM | POA: Diagnosis not present

## 2024-04-02 DIAGNOSIS — Z23 Encounter for immunization: Secondary | ICD-10-CM

## 2024-04-02 DIAGNOSIS — D126 Benign neoplasm of colon, unspecified: Secondary | ICD-10-CM

## 2024-04-02 DIAGNOSIS — I152 Hypertension secondary to endocrine disorders: Secondary | ICD-10-CM

## 2024-04-02 LAB — POCT UA - MICROALBUMIN
Albumin/Creatinine Ratio, Urine, POC: 14.6
Creatinine, POC: 260.3 mg/dL
Microalbumin Ur, POC: 38 mg/L

## 2024-04-02 LAB — POCT GLYCOSYLATED HEMOGLOBIN (HGB A1C): Hemoglobin A1C: 6.2 % — AB (ref 4.0–5.6)

## 2024-04-02 LAB — LIPID PANEL

## 2024-04-02 MED ORDER — TIRZEPATIDE 7.5 MG/0.5ML ~~LOC~~ SOAJ: 7.5000 mg | SUBCUTANEOUS | 1 refills | Status: DC

## 2024-04-02 NOTE — Progress Notes (Signed)
   Subjective:    Patient ID: Guy Taylor, male    DOB: 09-Jul-1970, 53 y.o.   MRN: [id number]  Discussed the use of AI scribe software for clinical note transcription with the patient, who gave verbal consent to proceed.  History of Present Illness   Guy Taylor is a 53 year old male with diabetes and hypertension who presents for a follow-up on his diabetes management and testosterone  levels.  He is managing his diabetes with metformin  and Mounjaro . Blood sugar levels fluctuate, with a recent high of over 200 mg/dL after consuming candy, but generally range around 150 mg/dL. The lowest recorded blood sugar was 104 mg/dL. His weight has remained relatively stable, with a slight decrease from 417 lbs in July to 415 lbs currently. He is monitoring his weight at home, noting minimal changes.  He is being monitored for testosterone  levels and is using a testosterone  gel, applying six pumps. He is taking amlodipine , carvedilol , and losartan  for hypertension.  In terms of social history, he does not smoke or drink alcohol. He has recently started participating in a HIIT class with his wife, which he has been doing for the past couple of weeks.  He has been referred to see an eye doctor as part of his routine care.     He mentioned difficulty with right hip pain but this seems to be mainly an issue with sitting in a particular car for a long period of time and he does not have difficulty with use of the hip at any other time.  He also has had some left shoulder discomfort again no particular pattern to this.      Review of Systems     Objective:    Physical Exam Physical Exam   MEASUREMENTS: Weight- 415.     Diabetic foot exam is normal.  Hemoglobin A1c is 6.2       Assessment & Plan:  Assessment and Plan    Type 2 diabetes mellitus Well-controlled with A1c of 6.2%. Occasional glucose spikes due to diet. Stable weight suggests need for increased medication for weight loss and  better control. - Increased Mounjaro  dosage from 5 mg to 7.5 mg. - Instructed to contact via MyChart in one month to report on weight loss and symptoms. - Referred to ophthalmology for diabetes-related eye examination.  Obesity Weight 415 lbs, down from 417 lbs in July. Weight loss crucial for diabetes control and potential testosterone  improvement. - Increased Mounjaro  dosage to 7.5 mg to aid in weight loss. - Encouraged continued participation in HIIT classes for exercise. - Instructed to monitor weight regularly at home.  Testosterone  deficiency Managed with testosterone  injections. Weight loss may improve levels by reducing adipose tissue and aromatase activity.  Hypertension Well-controlled with amlodipine , carvedilol , and losartan . Satisfactory blood pressure readings. - Continue current antihypertensive medications: amlodipine , carvedilol , and losartan .

## 2024-04-03 ENCOUNTER — Ambulatory Visit: Payer: Self-pay | Admitting: Family Medicine

## 2024-04-03 LAB — CBC WITH DIFFERENTIAL/PLATELET
Basophils Absolute: 0 x10E3/uL (ref 0.0–0.2)
Basos: 1 %
EOS (ABSOLUTE): 0.3 x10E3/uL (ref 0.0–0.4)
Eos: 6 %
Hematocrit: 45.8 % (ref 37.5–51.0)
Hemoglobin: 15.1 g/dL (ref 13.0–17.7)
Immature Grans (Abs): 0 x10E3/uL (ref 0.0–0.1)
Immature Granulocytes: 0 %
Lymphocytes Absolute: 1.4 x10E3/uL (ref 0.7–3.1)
Lymphs: 30 %
MCH: 29.2 pg (ref 26.6–33.0)
MCHC: 33 g/dL (ref 31.5–35.7)
MCV: 88 fL (ref 79–97)
Monocytes Absolute: 0.3 x10E3/uL (ref 0.1–0.9)
Monocytes: 6 %
Neutrophils Absolute: 2.7 x10E3/uL (ref 1.4–7.0)
Neutrophils: 57 %
Platelets: 249 x10E3/uL (ref 150–450)
RBC: 5.18 x10E6/uL (ref 4.14–5.80)
RDW: 12.7 % (ref 11.6–15.4)
WBC: 4.8 x10E3/uL (ref 3.4–10.8)

## 2024-04-03 LAB — COMPREHENSIVE METABOLIC PANEL WITH GFR
ALT: 9 IU/L (ref 0–44)
AST: 18 IU/L (ref 0–40)
Albumin: 4.6 g/dL (ref 3.8–4.9)
Alkaline Phosphatase: 71 IU/L (ref 47–123)
BUN/Creatinine Ratio: 15 (ref 9–20)
BUN: 17 mg/dL (ref 6–24)
Bilirubin Total: 0.5 mg/dL (ref 0.0–1.2)
CO2: 23 mmol/L (ref 20–29)
Calcium: 9.7 mg/dL (ref 8.7–10.2)
Chloride: 102 mmol/L (ref 96–106)
Creatinine, Ser: 1.12 mg/dL (ref 0.76–1.27)
Globulin, Total: 2.2 g/dL (ref 1.5–4.5)
Glucose: 127 mg/dL — AB (ref 70–99)
Potassium: 4.3 mmol/L (ref 3.5–5.2)
Sodium: 139 mmol/L (ref 134–144)
Total Protein: 6.8 g/dL (ref 6.0–8.5)
eGFR: 79 mL/min/1.73 (ref >59)

## 2024-04-03 LAB — LIPID PANEL
Chol/HDL Ratio: 3.4 ratio (ref 0.0–5.0)
Cholesterol, Total: 173 mg/dL (ref 100–199)
HDL: 51 mg/dL (ref >39)
LDL Chol Calc (NIH): 108 mg/dL — ABNORMAL HIGH (ref 0–99)
Triglycerides: 72 mg/dL (ref 0–149)
VLDL Cholesterol Cal: 14 mg/dL (ref 5–40)

## 2024-04-03 LAB — TESTOSTERONE: Testosterone: 440 ng/dL (ref 264–916)

## 2024-04-17 ENCOUNTER — Emergency Department (HOSPITAL_BASED_OUTPATIENT_CLINIC_OR_DEPARTMENT_OTHER)
Admission: EM | Admit: 2024-04-17 | Discharge: 2024-04-17 | Disposition: A | Discharge status: Home / Self Care | Attending: Emergency Medicine | Admitting: Emergency Medicine

## 2024-04-17 ENCOUNTER — Other Ambulatory Visit: Payer: Self-pay

## 2024-04-17 ENCOUNTER — Encounter (HOSPITAL_BASED_OUTPATIENT_CLINIC_OR_DEPARTMENT_OTHER): Payer: Self-pay

## 2024-04-17 DIAGNOSIS — J45909 Unspecified asthma, uncomplicated: Secondary | ICD-10-CM | POA: Diagnosis not present

## 2024-04-17 DIAGNOSIS — R1013 Epigastric pain: Secondary | ICD-10-CM | POA: Insufficient documentation

## 2024-04-17 DIAGNOSIS — I10 Essential (primary) hypertension: Secondary | ICD-10-CM | POA: Diagnosis not present

## 2024-04-17 DIAGNOSIS — E1165 Type 2 diabetes mellitus with hyperglycemia: Secondary | ICD-10-CM | POA: Insufficient documentation

## 2024-04-17 DIAGNOSIS — Z7984 Long term (current) use of oral hypoglycemic drugs: Secondary | ICD-10-CM | POA: Diagnosis not present

## 2024-04-17 LAB — COMPREHENSIVE METABOLIC PANEL WITH GFR
ALT: 9 U/L (ref 0–44)
AST: 24 U/L (ref 15–41)
Albumin: 4.6 g/dL (ref 3.5–5.0)
Alkaline Phosphatase: 64 U/L (ref 38–126)
Anion gap: 14 (ref 5–15)
BUN: 27 mg/dL — ABNORMAL HIGH (ref 6–20)
CO2: 24 mmol/L (ref 22–32)
Calcium: 9.7 mg/dL (ref 8.9–10.3)
Chloride: 103 mmol/L (ref 98–111)
Creatinine, Ser: 1.59 mg/dL — ABNORMAL HIGH (ref 0.61–1.24)
GFR, Estimated: 52 mL/min — ABNORMAL LOW (ref >60)
Glucose, Bld: 166 mg/dL — ABNORMAL HIGH (ref 70–99)
Potassium: 4.1 mmol/L (ref 3.5–5.1)
Sodium: 141 mmol/L (ref 135–145)
Total Bilirubin: 0.8 mg/dL (ref 0.0–1.2)
Total Protein: 7.4 g/dL (ref 6.5–8.1)

## 2024-04-17 LAB — CBC
HCT: 44.9 % (ref 39.0–52.0)
Hemoglobin: 14.7 g/dL (ref 13.0–17.0)
MCH: 28.3 pg (ref 26.0–34.0)
MCHC: 32.7 g/dL (ref 30.0–36.0)
MCV: 86.3 fL (ref 80.0–100.0)
Platelets: 249 K/uL (ref 150–400)
RBC: 5.2 MIL/uL (ref 4.22–5.81)
RDW: 13.3 % (ref 11.5–15.5)
WBC: 8 K/uL (ref 4.0–10.5)
nRBC: 0 % (ref 0.0–0.2)

## 2024-04-17 LAB — LIPASE, BLOOD: Lipase: 25 U/L (ref 11–51)

## 2024-04-17 MED ORDER — LIDOCAINE VISCOUS HCL 2 % MT SOLN
15.0000 mL | Freq: Once | OROMUCOSAL | Status: AC
  Administered 2024-04-17: 15 mL via ORAL
  Filled 2024-04-17: qty 15

## 2024-04-17 MED ORDER — ONDANSETRON 4 MG PO TBDP: 4.0000 mg | ORAL_TABLET | Freq: Three times a day (TID) | ORAL | 0 refills | Status: AC | PRN

## 2024-04-17 MED ORDER — PANTOPRAZOLE SODIUM 40 MG IV SOLR
40.0000 mg | Freq: Once | INTRAVENOUS | Status: AC
  Administered 2024-04-17: 40 mg via INTRAVENOUS
  Filled 2024-04-17: qty 10

## 2024-04-17 MED ORDER — ONDANSETRON HCL 4 MG/2ML IJ SOLN
4.0000 mg | Freq: Once | INTRAMUSCULAR | Status: AC
  Administered 2024-04-17: 4 mg via INTRAVENOUS
  Filled 2024-04-17: qty 2

## 2024-04-17 MED ORDER — PANTOPRAZOLE SODIUM 20 MG PO TBEC: 20.0000 mg | DELAYED_RELEASE_TABLET | Freq: Every day | ORAL | 0 refills | Status: DC

## 2024-04-17 MED ORDER — ONDANSETRON HCL 4 MG/2ML IJ SOLN
4.0000 mg | Freq: Once | INTRAMUSCULAR | Status: DC
  Filled 2024-04-17: qty 2

## 2024-04-17 MED ORDER — SODIUM CHLORIDE 0.9 % IV BOLUS
500.0000 mL | Freq: Once | INTRAVENOUS | Status: AC
  Administered 2024-04-17: 500 mL via INTRAVENOUS

## 2024-04-17 MED ORDER — ALUM & MAG HYDROXIDE-SIMETH 200-200-20 MG/5ML PO SUSP
30.0000 mL | Freq: Once | ORAL | Status: AC
  Administered 2024-04-17: 30 mL via ORAL
  Filled 2024-04-17: qty 30

## 2024-04-17 NOTE — ED Provider Notes (Addendum)
 [facility] Provider Note  CSN: [id number] Arrival date & time: 04/17/24 0017  Chief Complaint(s) Abdominal Pain and Emesis  HPI Guy Taylor is a 53 y.o. male     Abdominal Pain Pain location:  Epigastric and LUQ Pain quality: aching and cramping   Pain severity:  Moderate Onset quality:  Gradual Duration:  2 days Timing:  Intermittent Progression:  Waxing and waning Chronicity:  Recurrent Context: not alcohol use   Relieved by:  Nothing Worsened by:  Eating Associated symptoms: nausea and vomiting   Associated symptoms: no chest pain   Emesis Associated symptoms: abdominal pain     Past Medical History Past Medical History:  Diagnosis Date   Allergic rhinitis    Asthma    Diabetes mellitus    Hypertension    Hypogonadism male    Meniscus tear    rt knee   Obesity    Renal disorder    Stones in the urinary tract    Patient Active Problem List   Diagnosis Date Noted   Hx of adenomatous colonic polyps 12/19/2023   Colon cancer screening 12/12/2023   Family history of prostate cancer in father 05/10/2022   Gastroesophageal reflux disease without esophagitis 05/10/2022   Bilateral testicular atrophy 04/28/2016   Renal stone 03/03/2016   Erectile dysfunction 12/11/2014   Type 2 diabetes mellitus with complication, without long-term current use of insulin (HCC) 07/26/2011   Hypertension associated with diabetes (HCC) 07/26/2011   Morbid obesity (HCC) 07/26/2011   Hyperlipidemia associated with type 2 diabetes mellitus (HCC) 07/26/2011   Hypogonadism male 07/26/2011   Allergic rhinitis, seasonal 07/26/2011   Home Medication(s) Prior to Admission medications   Medication Sig Start Date End Date Taking? Authorizing Provider  ondansetron  (ZOFRAN -ODT) 4 MG disintegrating tablet Take 1 tablet (4 mg total) by mouth every 8 (eight) hours as needed for up to 3 days for nausea or vomiting. 04/17/24 04/20/24 Yes Abner Ardis,  Raynell Moder, MD  Alcohol Swabs (ALCOHOL PREP) PADS 1 each by Does not apply route 2 (two) times daily.    [provider]  amLODipine  (NORVASC ) 10 MG tablet Take 1 tablet (10 mg total) by mouth daily. 07/24/23   Lalonde, John C, MD  Blood Glucose Monitoring Suppl DEVI Test 1-2 times day. Pend on Insurance 03/21/23   Joyce Norleen BROCKS, MD  carvedilol  (COREG ) 25 MG tablet Take 1 tablet (25 mg total) by mouth 2 (two) times daily with a meal. 07/24/23   Joyce Norleen BROCKS, MD  ezetimibe  (ZETIA ) 10 MG tablet Take 1 tablet (10 mg total) by mouth daily. 07/24/23   Joyce Norleen BROCKS, MD  Glucose Blood (BLOOD GLUCOSE TEST STRIPS) STRP Test 1-2 times daily 03/21/23   Joyce Norleen BROCKS, MD  Lancet Device MISC 1-2 times daily 03/21/23   Joyce Norleen BROCKS, MD  Lancets Misc. MISC 1-2 times a day 03/21/23   Joyce Norleen BROCKS, MD  losartan -hydrochlorothiazide (HYZAAR) 100-12.5 MG tablet Take 1 tablet by mouth daily. 07/24/23   Joyce Norleen BROCKS, MD  metFORMIN  (GLUCOPHAGE ) 500 MG tablet TAKE 1 TABLET TWICE A DAY WITH MEALS 01/23/24   Joyce Norleen BROCKS, MD  naproxen  sodium (ALEVE ) 220 MG tablet Take 660 mg by mouth.    [provider]  pantoprazole  (PROTONIX ) 20 MG tablet Take 1 tablet (20 mg total) by mouth daily. 04/17/24   Trine Raynell Moder, MD  rosuvastatin  (CRESTOR ) 40 MG tablet Take 1 tablet (40 mg total) by mouth daily. Patient  not taking: Reported on 04/02/2024 03/21/23   Joyce Norleen BROCKS, MD  tadalafil  (CIALIS ) 20 MG tablet TAKE 1 TABLET DAILY AS NEEDED FOR ERECTILE DYSFUNCTION 07/24/23   Joyce Norleen BROCKS, MD  Testosterone  12.5 MG/ACT (1%) GEL Place 6 Pump onto the skin daily. 11/29/23   Lalonde, John C, MD  tirzepatide  (MOUNJARO ) 7.5 MG/0.5ML Pen Inject 7.5 mg into the skin once a week. 04/02/24   Joyce Norleen BROCKS, MD  TRUEPLUS LANCETS 30G MISC USE AS DIRECTED TO TEST TWICE DAILY 02/25/16   Joyce Norleen BROCKS, MD                                                                                                                                     Allergies Lisinopril  Review of Systems Review of Systems  Cardiovascular:  Negative for chest pain.  Gastrointestinal:  Positive for abdominal pain, nausea and vomiting.   As noted in HPI  Physical Exam Vital Signs  I have reviewed the triage vital signs BP 117/73   Pulse 94   Temp 98.2 F (36.8 C) (Oral)   Resp (!) 22   Ht 6' 5 (1.956 m)   Wt (!) 189.1 kg   SpO2 99%   BMI 49.45 kg/m   Physical Exam Vitals reviewed.  Constitutional:      General: He is not in acute distress.    Appearance: He is well-developed. He is obese. He is not diaphoretic.  HENT:     Head: Normocephalic and atraumatic.     Right Ear: External ear normal.     Left Ear: External ear normal.     Nose: Nose normal.     Mouth/Throat:     Mouth: Mucous membranes are moist.  Eyes:     General: No scleral icterus.    Conjunctiva/sclera: Conjunctivae normal.  Neck:     Trachea: Phonation normal.  Cardiovascular:     Rate and Rhythm: Normal rate and regular rhythm.  Pulmonary:     Effort: Pulmonary effort is normal. No respiratory distress.     Breath sounds: No stridor.  Abdominal:     General: There is no distension.     Tenderness: There is abdominal tenderness in the epigastric area and left upper quadrant. There is no guarding.  Musculoskeletal:        General: Normal range of motion.     Cervical back: Normal range of motion.  Neurological:     Mental Status: He is alert and oriented to person, place, and time.  Psychiatric:        Behavior: Behavior normal.     ED Results and Treatments Labs (all labs ordered are listed, but only abnormal results are displayed) Labs Reviewed  COMPREHENSIVE METABOLIC PANEL WITH GFR - Abnormal; Notable for the following components:      Result Value   Glucose, Bld 166 (*)    BUN 27 (*)  Creatinine, Ser 1.59 (*)    GFR, Estimated 52 (*)    All other components within normal limits  LIPASE, BLOOD  CBC                                                                                                                          EKG  EKG Interpretation Date/Time:  Wednesday April 17 2024 00:36:39 EST Ventricular Rate:  107 PR Interval:  181 QRS Duration:  104 QT Interval:  349 QTC Calculation: 466 R Axis:   27  Text Interpretation: Sinus tachycardia No significant change was found Confirmed by Trine Likes [phone]) on 04/17/2024 1:26:20 AM       Radiology No results found.  Medications Ordered in ED Medications  pantoprazole  (PROTONIX ) injection 40 mg (40 mg Intravenous Given 04/17/24 0142)  ondansetron  (ZOFRAN ) injection 4 mg (4 mg Intravenous Given 04/17/24 0139)  sodium chloride  0.9 % bolus 500 mL (0 mLs Intravenous Stopped 04/17/24 0241)  alum & mag hydroxide-simeth (MAALOX/MYLANTA) 200-200-20 MG/5ML suspension 30 mL (30 mLs Oral Given 04/17/24 0224)    And  lidocaine  (XYLOCAINE ) 2 % viscous mouth solution 15 mL (15 mLs Oral Given 04/17/24 0224)   Procedures Procedures  (including critical care time) Medical Decision Making / ED Course   Medical Decision Making Amount and/or Complexity of Data Reviewed Labs: ordered.  Risk OTC drugs. Prescription drug management.     Clinical Course as of 04/17/24 0331  Wed Apr 17, 2024  0144 Epigastric abdominal pain with coffee-ground emesis.  Differential diagnosis considered and workup below.  CBC without leukocytosis or anemia. CMP without significant electrolyte derangements.  Hyperglycemia without DKA.  Mild renal insufficiency without overt AKI.  No bili obstruction or pancreatitis.  EKG without acute ischemic changes, dysrhythmias or block.  Doubt cardiac etiology.  Favoring gastritis versus PUD.  Will treat symptomatically with IV fluids, antiemetics, Protonix  and GI cocktail.  Low suspicion for serious intra-abdominal inflammatory/infectious process of bowel obstruction at this time.  Will reassess patient and determine need for  imaging at that time. [PC]  0326 Pain completely resolved with antiemetics, protonix  and GI cocktail.  Monitored for additional hour w/o recurrence. Tolerated additional PO challenge.  No imaging needed at this time.  [PC]    Clinical Course User Index [PC] Jesselle Laflamme, Likes Moder, MD    Final Clinical Impression(s) / ED Diagnoses Final diagnoses:  Epigastric abdominal pain   The patient appears reasonably screened and/or stabilized for discharge and I doubt any other medical condition or other [facility] requiring further screening, evaluation, or treatment in the ED at this time. I have discussed the findings, Dx and Tx plan with the patient/family who expressed understanding and agree(s) with the plan. Discharge instructions discussed at length. The patient/family was given strict return precautions who verbalized understanding of the instructions. No further questions at time of discharge.  Disposition: Discharge  Condition: Good  ED Discharge Orders          Ordered  pantoprazole  (PROTONIX ) 20 MG tablet  Daily,   Status:  Discontinued        04/17/24 0325    ondansetron  (ZOFRAN -ODT) 4 MG disintegrating tablet  Every 8 hours PRN        04/17/24 0331    pantoprazole  (PROTONIX ) 20 MG tablet  Daily        04/17/24 0331             Follow Up: Joyce Norleen BROCKS, MD [address] [place] [address] [phone]  Call  as scheduled  Kriss Estefana DEL, DO [address] [place] [address] [phone]  Call  to schedule an appointment for close follow up    This chart was dictated using voice recognition software.  Despite best efforts to proofread,  errors can occur which can change the documentation meaning.      Trine Raynell Moder, MD 04/17/24 [phone]

## 2024-04-17 NOTE — ED Triage Notes (Signed)
 C/O intermittent bilateral upper abdominal pain x 2 days. Reports nausea and vomiting today that looked like ground coffee.

## 2024-04-19 ENCOUNTER — Other Ambulatory Visit: Payer: Self-pay | Admitting: Family Medicine

## 2024-04-19 DIAGNOSIS — E118 Type 2 diabetes mellitus with unspecified complications: Secondary | ICD-10-CM

## 2024-04-22 ENCOUNTER — Telehealth: Payer: Self-pay | Admitting: Internal Medicine

## 2024-04-22 NOTE — Telephone Encounter (Signed)
 Spoke with pt. Pt stated that his wife got an office visit for him prior today to see a provider on 05/29/2024 . Pt stated that he had went three days before today with out a BM. Pt stated that he took some Ducolax  and had a small BM this AM. Pt was notified that he could take Miralax daily to assist him with his BM.  Pt verbalized understanding with all questions answered.

## 2024-04-22 NOTE — Telephone Encounter (Signed)
 Dr Federico pt

## 2024-04-22 NOTE — Telephone Encounter (Signed)
 Patient calling in regards to ER visit he recently had. Requesting to speak with a nurse. Please advise.

## 2024-04-26 ENCOUNTER — Other Ambulatory Visit: Payer: Self-pay | Admitting: Family Medicine

## 2024-04-26 DIAGNOSIS — E782 Mixed hyperlipidemia: Secondary | ICD-10-CM

## 2024-05-01 ENCOUNTER — Other Ambulatory Visit: Payer: Self-pay | Admitting: Family Medicine

## 2024-05-01 DIAGNOSIS — N529 Male erectile dysfunction, unspecified: Secondary | ICD-10-CM

## 2024-05-06 MED ORDER — TIRZEPATIDE 10 MG/0.5ML ~~LOC~~ SOAJ: 10.0000 mg | SUBCUTANEOUS | 0 refills | Status: AC

## 2024-05-29 ENCOUNTER — Encounter: Payer: Self-pay | Admitting: Internal Medicine

## 2024-05-29 ENCOUNTER — Ambulatory Visit: Admitting: Internal Medicine

## 2024-05-29 VITALS — BP 124/76 | HR 98 | Ht 78.0 in | Wt >= 6400 oz

## 2024-05-29 DIAGNOSIS — Z860100 Personal history of colon polyps, unspecified: Secondary | ICD-10-CM | POA: Diagnosis not present

## 2024-05-29 DIAGNOSIS — Z8719 Personal history of other diseases of the digestive system: Secondary | ICD-10-CM

## 2024-05-29 DIAGNOSIS — K5900 Constipation, unspecified: Secondary | ICD-10-CM

## 2024-05-29 DIAGNOSIS — K219 Gastro-esophageal reflux disease without esophagitis: Secondary | ICD-10-CM

## 2024-05-29 DIAGNOSIS — K921 Melena: Secondary | ICD-10-CM

## 2024-05-29 DIAGNOSIS — R112 Nausea with vomiting, unspecified: Secondary | ICD-10-CM

## 2024-05-29 NOTE — Patient Instructions (Signed)
 Drink 8 cups of water a day and walk 30 minutes a day.  Please purchase the following medications over the counter and take as directed: Fiber supplement such as Benefiber- use as directed daily Miralax: Take as needed up to 3 times a day to achieve regular bowel movements.  You have been scheduled for an endoscopy. Please follow written instructions given to you at your visit today.  If you use inhalers (even only as needed), please bring them with you on the day of your procedure.  If you take any of the following medications, they will need to be adjusted prior to your procedure:   DO NOT TAKE 7 DAYS PRIOR TO TEST- Trulicity  (dulaglutide ) Ozempic, Wegovy (semaglutide) Mounjaro , Zepbound  (tirzepatide ) Bydureon Bcise (exanatide extended release)  DO NOT TAKE 1 DAY PRIOR TO YOUR TEST Rybelsus (semaglutide) Adlyxin (lixisenatide) Victoza (liraglutide) Byetta (exanatide) ___________________________________________________________________________   Thank you for entrusting me with your care and for choosing [facility], Dr. Estefana Kidney  _______________________________________________________  If your blood pressure at your visit was 140/90 or greater, please contact your primary care physician to follow up on this.  _______________________________________________________  If you are age 54 or older, your body mass index should be between 23-30. Your Body mass index is 48.33 kg/m. If this is out of the aforementioned range listed, please consider follow up with your Primary Care Provider.  If you are age 30 or younger, your body mass index should be between 19-25. Your Body mass index is 48.33 kg/m. If this is out of the aformentioned range listed, please consider follow up with your Primary Care Provider.   ________________________________________________________  The [facility] providers would like to encourage you to use MYCHART to communicate with providers for  non-urgent requests or questions.  Due to long hold times on the telephone, sending your provider a message by [facility] may be a faster and more efficient way to get a response.  Please allow 48 business hours for a response.  Please remember that this is for non-urgent requests.  _______________________________________________________  [facility] is using a team-based approach to care.  Your team is made up of your doctor and two to three APPS. Our APPS (Nurse Practitioners and Physician Assistants) work with your physician to ensure care continuity for you. They are fully qualified to address your health concerns and develop a treatment plan. They communicate directly with your gastroenterologist to care for you. Seeing the Advanced Practice Practitioners on your physician's team can help you by facilitating care more promptly, often allowing for earlier appointments, access to diagnostic testing, procedures, and other specialty referrals.

## 2024-05-29 NOTE — Progress Notes (Signed)
 "  Chief Complaint: Melena  HPI : 54 year old male with history of DM, obesity, asthma, nephrolithiasis presents to discuss melena  He had a food impaction in 2016 after eating some meat. Following this episode, he had not had any further food impactions and is more careful when eating his foods.   Interval History: His ab pain and N&V started Thanksgiving week when he was in [place]  and it started out as a gag. He had to pull over while driving because he threw up black emesis. When he got home, he threw up more black stuff. He was given GI cocktail after going to ED and was instructed just to have broth. At that time he had melena. Stools are no longer black now. Some days the pain can be excruciating. He can go 4-5 days and not have a BM. Denies fevers. He takes Aleve  on occasion for headache. Endorses chest burning. Endorses feeling bloated. Denies dysphagia. He is taking pantoprazole  20 mg every day. Last EGD was 10 years ago. He has been on Mounjaro  and had a dosage increase right before his ab pain and N&V started.  Past Medical History:  Diagnosis Date   Allergic rhinitis    Asthma    Diabetes mellitus    Hypertension    Hypogonadism male    Meniscus tear    rt knee   Obesity    Renal disorder    Stones in the urinary tract     Past Surgical History:  Procedure Laterality Date   COLONOSCOPY N/A 12/12/2023   Procedure: COLONOSCOPY;  Surgeon: Federico Rosario BROCKS, MD;  Location: [facility] ENDOSCOPY;  Service: Gastroenterology;  Laterality: N/A;   ESOPHAGOGASTRODUODENOSCOPY N/A 11/16/2014   Procedure: ESOPHAGOGASTRODUODENOSCOPY (EGD);  Surgeon: Lynwood Bohr, MD;  Location: [facility] ENDOSCOPY;  Service: Endoscopy;  Laterality: N/A;   KNEE ARTHROSCOPY  02/13/2012   Procedure: ARTHROSCOPY KNEE;  Surgeon: Norleen LITTIE Gavel, MD;  Location: [facility] OR;  Service: Orthopedics;  Laterality: Right;   Family History  Problem Relation Age of Onset   Diabetes Mother    Social History   Tobacco Use   Smoking  status: Never   Smokeless tobacco: Never  Vaping Use   Vaping status: Never Used  Substance Use Topics   Alcohol use: No   Drug use: No   Current Outpatient Medications  Medication Sig Dispense Refill   Alcohol Swabs (ALCOHOL PREP) PADS 1 each by Does not apply route 2 (two) times daily.     amLODipine  (NORVASC ) 10 MG tablet Take 1 tablet (10 mg total) by mouth daily. 90 tablet 3   Blood Glucose Monitoring Suppl DEVI Test 1-2 times day. Pend on Insurance 1 each 0   carvedilol  (COREG ) 25 MG tablet Take 1 tablet (25 mg total) by mouth 2 (two) times daily with a meal. 180 tablet 3   ezetimibe  (ZETIA ) 10 MG tablet TAKE 1 TABLET DAILY 90 tablet 0   Glucose Blood (BLOOD GLUCOSE TEST STRIPS) STRP Test 1-2 times daily 100 strip 5   Lancet Device MISC 1-2 times daily 1 each 0   Lancets Misc. MISC 1-2 times a day 100 each 5   losartan -hydrochlorothiazide (HYZAAR) 100-12.5 MG tablet Take 1 tablet by mouth daily. 90 tablet 3   metFORMIN  (GLUCOPHAGE ) 500 MG tablet TAKE 1 TABLET TWICE A DAY WITH MEALS 180 tablet 1   naproxen  sodium (ALEVE ) 220 MG tablet Take 660 mg by mouth.     pantoprazole  (PROTONIX ) 20 MG tablet Take 1 tablet (20 mg  total) by mouth daily. 30 tablet 0   rosuvastatin  (CRESTOR ) 40 MG tablet Take 1 tablet (40 mg total) by mouth daily. 90 tablet 3   tadalafil  (CIALIS ) 20 MG tablet TAKE 1 TABLET DAILY AS NEEDED FOR ERECTILE DYSFUNCTION 24 tablet 5   Testosterone  12.5 MG/ACT (1%) GEL Place 6 Pump onto the skin daily. 225 g 5   tirzepatide  (MOUNJARO ) 10 MG/0.5ML Pen Inject 10 mg into the skin once a week. 6 mL 0   TRUEPLUS LANCETS 30G MISC USE AS DIRECTED TO TEST TWICE DAILY 100 each 3   No current facility-administered medications for this visit.   Allergies  Allergen Reactions   Lisinopril Shortness Of Breath and Swelling   Review of Systems: All systems reviewed and negative except where noted in HPI.   Physical Exam: BP 124/76   Pulse 98   Ht 6' 6 (1.981 m)   Wt (!) 418 lb  4 oz (189.7 kg)   BMI 48.33 kg/m  Constitutional: Pleasant,well-developed, male in no acute distress. HEENT: Normocephalic and atraumatic. Conjunctivae are normal. No scleral icterus. Cardiovascular: Normal rate  Pulmonary/chest: Effort normal Abdominal: Soft, nondistended, tender in the epigastric area Extremities: No edema Neurological: Alert and oriented to person place and time. Skin: Skin is warm and dry. No rashes noted. Psychiatric: Normal mood and affect. Behavior is normal.  Labs 02/2023: CBC nml. CMP with elevated glucose of 144. HbA1C 6.6%.  EGD 11/16/14: ENDOSCOPIC IMPRESSION: 1. There was a large amount of residual food seen at the gastroesophageal junction. the food impaction was removed with multiple passages of the scope. The patient tolerated the procedure fairly well and there were no immediate complications 2. Normal 3. Normal  CTA C/A/P w/contrast 01/20/22: IMPRESSION: 1. No evidence for aortic dissection or aneurysm. 2. No acute process in the chest, abdomen or pelvis. 3. Borderline cardiomegaly. 4. Colonic diverticulosis. 5. Moderate-sized fat containing umbilical hernia.  Colonoscopy 12/12/23: - Diverticulosis in the entire examined colon. - Submucosal nodule ( suspected lipoma) in the ascending colon. Biopsied. - One 2 mm polyp in the transverse colon, removed with a cold biopsy forceps. Resected and retrieved. - Non- bleeding internal hemorrhoids. Path: A. COLON, ASCENDING, BIOPSY:  - Fragments of benign colonic mucosa with no specific pathologic  changes.  See comment.  B. COLON, TRANSVERSE, POLYPECTOMY:  - Tubular adenoma.  - No high grade dysplasia or malignancy.  COMMENT:  Part A: The clinical concern for submucosal lipoma is noted.  There is  no submucosa available for evaluation.  Additional multiple levels are  examined.   ASSESSMENT AND PLAN: Melena N&V, coffee ground emesis GERD Constipation History of food impaction History of colon  polyps Patient presents with an exacerbation of ab pain and coffee ground emesis last month. This did coincide with an increase in the dosage of his Mounjaro , which may be the precipitating factor for his symptoms. He also noticed some melena. Endorses occasional NSAID use so PUD is a possibility. Patient also had a history of food impaction possibly related to stricture from GERD so will also plan to evaluate for esophagitis. He has also noted more constipation issues, which may also be related to Mounjaro . Will plan for an EGD for further evaluation and started some conservative therapies for constipation - Drink 8 cups of water, increase fiber, Miralax PRN - Continue Protonix  20 mg QD - EGD [facility] - May consider decreasing dose of Mounjaro   Estefana Kidney, MD  I spent 30 minutes of time, including in depth chart review,  independent review of results as outlined above, communicating results with the patient directly, face-to-face time with the patient, coordinating care, ordering studies and medications as appropriate, and documentation.   "

## 2024-06-06 ENCOUNTER — Ambulatory Visit: Admitting: Internal Medicine

## 2024-06-06 ENCOUNTER — Encounter: Payer: Self-pay | Admitting: Internal Medicine

## 2024-06-06 VITALS — BP 112/83 | HR 72 | Temp 97.5°F | Resp 14 | Ht 78.0 in | Wt >= 6400 oz

## 2024-06-06 DIAGNOSIS — K219 Gastro-esophageal reflux disease without esophagitis: Secondary | ICD-10-CM

## 2024-06-06 DIAGNOSIS — K259 Gastric ulcer, unspecified as acute or chronic, without hemorrhage or perforation: Secondary | ICD-10-CM

## 2024-06-06 DIAGNOSIS — K3189 Other diseases of stomach and duodenum: Secondary | ICD-10-CM

## 2024-06-06 DIAGNOSIS — K921 Melena: Secondary | ICD-10-CM

## 2024-06-06 DIAGNOSIS — R112 Nausea with vomiting, unspecified: Secondary | ICD-10-CM

## 2024-06-06 MED ORDER — PANTOPRAZOLE SODIUM 40 MG PO TBEC: 40.0000 mg | DELAYED_RELEASE_TABLET | Freq: Two times a day (BID) | ORAL | 1 refills | Status: AC

## 2024-06-06 MED ORDER — SODIUM CHLORIDE 0.9 % IV SOLN: 500.0000 mL | INTRAVENOUS | Status: DC

## 2024-06-06 NOTE — Progress Notes (Signed)
 Sedate, gd SR, tolerated procedure well, VSS, report to RN

## 2024-06-06 NOTE — Progress Notes (Signed)
 "   GASTROENTEROLOGY PROCEDURE H&P NOTE   Primary Care Physician: Joyce Norleen BROCKS, MD    Reason for Procedure:   Melena, N&V, coffee ground emesis  Plan:    EGD  Patient is appropriate for endoscopic procedure(s) in the ambulatory ([facility]) setting.  The nature of the procedure, as well as the risks, benefits, and alternatives were carefully and thoroughly reviewed with the patient. Ample time for discussion and questions allowed. The patient understood, was satisfied, and agreed to proceed.     HPI: Guy Taylor is a 54 y.o. male who presents for EGD for evaluation of melena, N&V, coffee ground emesis .  Patient was most recently seen in the Gastroenterology Clinic on 05/29/24.  No interval change in medical history since that appointment. Please refer to that note for full details regarding GI history and clinical presentation.   Past Medical History:  Diagnosis Date   Allergic rhinitis    Asthma    Diabetes mellitus    Hyperlipidemia    Hypertension    Hypogonadism male    Meniscus tear    rt knee   Obesity    Renal disorder    Sleep apnea    Stones in the urinary tract     Past Surgical History:  Procedure Laterality Date   COLONOSCOPY N/A 12/12/2023   Procedure: COLONOSCOPY;  Surgeon: Federico Rosario BROCKS, MD;  Location: [facility];  Service: Gastroenterology;  Laterality: N/A;   COLOSTOMY     ESOPHAGOGASTRODUODENOSCOPY N/A 11/16/2014   Procedure: ESOPHAGOGASTRODUODENOSCOPY (EGD);  Surgeon: Lynwood Bohr, MD;  Location: [facility];  Service: Endoscopy;  Laterality: N/A;   KNEE ARTHROSCOPY  02/13/2012   Procedure: ARTHROSCOPY KNEE;  Surgeon: Norleen LITTIE Gavel, MD;  Location: [facility] OR;  Service: Orthopedics;  Laterality: Right;   left foot surgery Left 2018   tendon reattachment   UPPER GASTROINTESTINAL ENDOSCOPY      Prior to Admission medications  Medication Sig Start Date End Date Taking? Authorizing Provider  amLODipine  (NORVASC ) 10 MG tablet Take 1 tablet (10 mg total)  by mouth daily. 07/24/23  Yes Lalonde, John C, MD  Blood Glucose Monitoring Suppl DEVI Test 1-2 times day. Pend on Insurance 03/21/23  Yes Joyce Norleen BROCKS, MD  carvedilol  (COREG ) 25 MG tablet Take 1 tablet (25 mg total) by mouth 2 (two) times daily with a meal. 07/24/23  Yes Joyce Norleen BROCKS, MD  ezetimibe  (ZETIA ) 10 MG tablet TAKE 1 TABLET DAILY 04/26/24  Yes Lalonde, John C, MD  Glucose Blood (BLOOD GLUCOSE TEST STRIPS) STRP Test 1-2 times daily 03/21/23  Yes Joyce Norleen BROCKS, MD  Lancet Device MISC 1-2 times daily 03/21/23  Yes Joyce Norleen BROCKS, MD  Lancets Misc. MISC 1-2 times a day 03/21/23  Yes Joyce Norleen BROCKS, MD  losartan -hydrochlorothiazide (HYZAAR) 100-12.5 MG tablet Take 1 tablet by mouth daily. 07/24/23  Yes Joyce Norleen BROCKS, MD  metFORMIN  (GLUCOPHAGE ) 500 MG tablet TAKE 1 TABLET TWICE A DAY WITH MEALS 04/22/24  Yes Joyce Norleen BROCKS, MD  pantoprazole  (PROTONIX ) 20 MG tablet Take 1 tablet (20 mg total) by mouth daily. 04/17/24  Yes Cardama, Raynell Moder, MD  rosuvastatin  (CRESTOR ) 40 MG tablet Take 1 tablet (40 mg total) by mouth daily. 03/21/23  Yes Joyce Norleen BROCKS, MD  Testosterone  12.5 MG/ACT (1%) GEL Place 6 Pump onto the skin daily. 11/29/23  Yes Joyce Norleen BROCKS, MD  TRUEPLUS LANCETS 30G MISC USE AS DIRECTED TO TEST TWICE DAILY 02/25/16  Yes Joyce Norleen BROCKS, MD  Alcohol Swabs (ALCOHOL PREP) PADS 1 each by Does not apply route 2 (two) times daily.    [provider]  naproxen  sodium (ALEVE ) 220 MG tablet Take 660 mg by mouth.    [provider]  tadalafil  (CIALIS ) 20 MG tablet TAKE 1 TABLET DAILY AS NEEDED FOR ERECTILE DYSFUNCTION 05/01/24   Joyce Norleen BROCKS, MD  tirzepatide  (MOUNJARO ) 10 MG/0.5ML Pen Inject 10 mg into the skin once a week. 05/06/24   Joyce Norleen BROCKS, MD    Current Outpatient Medications  Medication Sig Dispense Refill   amLODipine  (NORVASC ) 10 MG tablet Take 1 tablet (10 mg total) by mouth daily. 90 tablet 3   Blood Glucose Monitoring Suppl DEVI Test 1-2 times  day. Pend on Insurance 1 each 0   carvedilol  (COREG ) 25 MG tablet Take 1 tablet (25 mg total) by mouth 2 (two) times daily with a meal. 180 tablet 3   ezetimibe  (ZETIA ) 10 MG tablet TAKE 1 TABLET DAILY 90 tablet 0   Glucose Blood (BLOOD GLUCOSE TEST STRIPS) STRP Test 1-2 times daily 100 strip 5   Lancet Device MISC 1-2 times daily 1 each 0   Lancets Misc. MISC 1-2 times a day 100 each 5   losartan -hydrochlorothiazide (HYZAAR) 100-12.5 MG tablet Take 1 tablet by mouth daily. 90 tablet 3   metFORMIN  (GLUCOPHAGE ) 500 MG tablet TAKE 1 TABLET TWICE A DAY WITH MEALS 180 tablet 1   pantoprazole  (PROTONIX ) 20 MG tablet Take 1 tablet (20 mg total) by mouth daily. 30 tablet 0   rosuvastatin  (CRESTOR ) 40 MG tablet Take 1 tablet (40 mg total) by mouth daily. 90 tablet 3   Testosterone  12.5 MG/ACT (1%) GEL Place 6 Pump onto the skin daily. 225 g 5   TRUEPLUS LANCETS 30G MISC USE AS DIRECTED TO TEST TWICE DAILY 100 each 3   Alcohol Swabs (ALCOHOL PREP) PADS 1 each by Does not apply route 2 (two) times daily.     naproxen  sodium (ALEVE ) 220 MG tablet Take 660 mg by mouth.     tadalafil  (CIALIS ) 20 MG tablet TAKE 1 TABLET DAILY AS NEEDED FOR ERECTILE DYSFUNCTION 24 tablet 5   tirzepatide  (MOUNJARO ) 10 MG/0.5ML Pen Inject 10 mg into the skin once a week. 6 mL 0   Current Facility-Administered Medications  Medication Dose Route Frequency Provider Last Rate Last Admin   0.9 %  sodium chloride  infusion  500 mL Intravenous Continuous Federico Rosario BROCKS, MD        Allergies as of 06/06/2024 - Review Complete 06/06/2024  Allergen Reaction Noted   Lisinopril Shortness Of Breath and Swelling 08/20/2010    Family History  Problem Relation Age of Onset   Diabetes Mother    Stomach cancer Maternal Uncle    Colon cancer Neg Hx    Esophageal cancer Neg Hx    Rectal cancer Neg Hx     Social History   Socioeconomic History   Marital status: Married    Spouse name: Not on file   Number of children: 1   Years of  education: Not on file   Highest education level: Not on file  Occupational History   Not on file  Tobacco Use   Smoking status: Never   Smokeless tobacco: Never  Vaping Use   Vaping status: Never Used  Substance and Sexual Activity   Alcohol use: No   Drug use: No   Sexual activity: Yes  Other Topics Concern   Not on file  Social History Narrative  Works for [employer], games developer.   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   Social Drivers of Health   Tobacco Use: Low Risk (06/06/2024)   Patient History    Smoking Tobacco Use: Never    Smokeless Tobacco Use: Never    Passive Exposure: Not on file  Financial Resource Strain: Not on file  Food Insecurity: Not on file  Transportation Needs: Not on file  Physical Activity: Not on file  Stress: Not on file  Social Connections: Not on file  Intimate Partner Violence: Not on file  Depression (PHQ2-9): Low Risk (07/24/2023)   Depression (PHQ2-9)    PHQ-2 Score: 0  Alcohol Screen: Not on file  Housing: Not on file  Utilities: Not on file  Health Literacy: Not on file    Physical Exam: Vital signs in last 24 hours: BP (!) 104/59   Pulse 86   Temp (!) 97.5 F (36.4 C)   Ht 6' 6 (1.981 m)   Wt (!) 413 lb 6.4 oz (187.5 kg)   SpO2 96%   BMI 47.77 kg/m  GEN: NAD EYE: Sclerae anicteric ENT: MMM CV: Non-tachycardic Pulm: No increased WOB GI: Soft NEURO:  Alert & Oriented   Estefana Kidney, MD [facility]   06/06/2024 8:11 AM  "

## 2024-06-06 NOTE — Patient Instructions (Signed)
 Please read handouts provided. Await pathology results. Avoid NSAIDs ( such as naproxen  ) if possible. Pantoprazole  40 mg twice daily for 2 months.  Repeat EGD in 2 months to check for healing.   YOU HAD AN ENDOSCOPIC PROCEDURE TODAY AT THE [facility]:   Refer to the procedure report that was given to you for any specific questions about what was found during the examination.  If the procedure report does not answer your questions, please call your gastroenterologist to clarify.  If you requested that your care partner not be given the details of your procedure findings, then the procedure report has been included in a sealed envelope for you to review at your convenience later.  YOU SHOULD EXPECT: Some feelings of bloating in the abdomen. Passage of more gas than usual.  Walking can help get rid of the air that was put into your GI tract during the procedure and reduce the bloating. If you had a lower endoscopy (such as a colonoscopy or flexible sigmoidoscopy) you may notice spotting of blood in your stool or on the toilet paper. If you underwent a bowel prep for your procedure, you may not have a normal bowel movement for a few days.  Please Note:  You might notice some irritation and congestion in your nose or some drainage.  This is from the oxygen used during your procedure.  There is no need for concern and it should clear up in a day or so.  SYMPTOMS TO REPORT IMMEDIATELY:  Following upper endoscopy (EGD)  Vomiting of blood or coffee ground material  New chest pain or pain under the shoulder blades  Painful or persistently difficult swallowing  New shortness of breath  Fever of 100F or higher  Black, tarry-looking stools  For urgent or emergent issues, a gastroenterologist can be reached at any hour by calling [phone]. Do not use MyChart messaging for urgent concerns.    DIET:  We do recommend a small meal at first, but then you may proceed to your regular  diet.  Drink plenty of fluids but you should avoid alcoholic beverages for 24 hours.  ACTIVITY:  You should plan to take it easy for the rest of today and you should NOT DRIVE or use heavy machinery until tomorrow (because of the sedation medicines used during the test).    FOLLOW UP: Our staff will call the number listed on your records the next business day following your procedure.  We will call around 7:15- 8:00 am to check on you and address any questions or concerns that you may have regarding the information given to you following your procedure. If we do not reach you, we will leave a message.     If any biopsies were taken you will be contacted by phone or by letter within the next 1-3 weeks.  Please call us  at [phone] if you have not heard about the biopsies in 3 weeks.    SIGNATURES/CONFIDENTIALITY: You and/or your care partner have signed paperwork which will be entered into your electronic medical record.  These signatures attest to the fact that that the information above on your After Visit Summary has been reviewed and is understood.  Full responsibility of the confidentiality of this discharge information lies with you and/or your care-partner.

## 2024-06-06 NOTE — Op Note (Signed)
 [facility] Patient Name: Guy Taylor Procedure Date: 06/06/2024 7:17 AM MRN: [id number] Endoscopist: Rosario Estefana Kidney , , [id number] Age: 54 Referring MD:  Date of Birth: 14-Feb-1971 Gender: Male Account #: [id number] Procedure:                Upper GI endoscopy Indications:              Coffee-ground emesis, Melena, Nausea with vomiting Medicines:                Monitored Anesthesia Care Procedure:                Pre-Anesthesia Assessment:                           - Prior to the procedure, a History and Physical                            was performed, and patient medications and                            allergies were reviewed. The patient's tolerance of                            previous anesthesia was also reviewed. The risks                            and benefits of the procedure and the sedation                            options and risks were discussed with the patient.                            All questions were answered, and informed consent                            was obtained. Prior Anticoagulants: The patient has                            taken no anticoagulant or antiplatelet agents. ASA                            Grade Assessment: III - A patient with severe                            systemic disease. After reviewing the risks and                            benefits, the patient was deemed in satisfactory                            condition to undergo the procedure.                           After obtaining informed consent, the endoscope was  passed under direct vision. Throughout the                            procedure, the patient's blood pressure, pulse, and                            oxygen saturations were monitored continuously. The                            GIF [id number] #[id number] was introduced through the                            mouth, and advanced to the second part of duodenum.                             The upper GI endoscopy was accomplished without                            difficulty. The patient tolerated the procedure                            well. Scope In: Scope Out: Findings:                 The examined esophagus was normal.                           Two non-bleeding cratered gastric ulcers with a                            clean ulcer base (Forrest Class III) were found in                            the gastric antrum. The largest lesion was 10 mm in                            largest dimension. Biopsies were taken with a cold                            forceps for histology.                           The examined duodenum was normal. Complications:            No immediate complications. Estimated Blood Loss:     Estimated blood loss was minimal. Impression:               - Normal esophagus.                           - Non-bleeding gastric ulcers with a clean ulcer                            base (Forrest Class III). Biopsied.                           -  Normal examined duodenum. Recommendation:           - Discharge patient to home (with escort).                           - Await pathology results.                           - Avoid NSAIDs (such as naproxen ) if possible.                           - Increase pantoprazole  to 40 mg twice daily for 2                            months.                           - Repeat upper endoscopy in 2 months to check                            healing.                           - The findings and recommendations were discussed                            with the patient. Dr Estefana Federico Rosario Estefana Federico,  06/06/2024 8:26:04 AM

## 2024-06-06 NOTE — Progress Notes (Signed)
 Called to room to assist during endoscopic procedure.  Patient ID and intended procedure confirmed with present staff. Received instructions for my participation in the procedure from the performing physician.

## 2024-06-07 ENCOUNTER — Telehealth: Payer: Self-pay | Admitting: Lactation Services

## 2024-06-07 NOTE — Telephone Encounter (Signed)
" °  Follow up Call-     06/06/2024    7:23 AM  Call back number  Post procedure Call Back phone  # [phone]  Permission to leave phone message Yes     Patient questions:  Do you have a fever, pain , or abdominal swelling? No. Pain Score  0 *  Have you tolerated food without any problems? Yes.    Have you been able to return to your normal activities? Yes.    Do you have any questions about your discharge instructions: Diet   No. Medications  No. Follow up visit  No.  Do you have questions or concerns about your Care? No.  Actions: * If pain score is 4 or above: No action needed, pain <4.   "

## 2024-06-11 ENCOUNTER — Ambulatory Visit: Payer: Self-pay | Admitting: Internal Medicine

## 2024-06-11 LAB — SURGICAL PATHOLOGY

## 2024-06-12 ENCOUNTER — Other Ambulatory Visit: Payer: Self-pay | Admitting: Family Medicine

## 2024-06-12 DIAGNOSIS — E1169 Type 2 diabetes mellitus with other specified complication: Secondary | ICD-10-CM

## 2024-06-12 MED ORDER — ROSUVASTATIN CALCIUM 20 MG PO TABS: 20.0000 mg | ORAL_TABLET | Freq: Every day | ORAL | 3 refills | Status: AC

## 2024-06-12 NOTE — Progress Notes (Signed)
 I received a therapy alert about his statin drug.  He is taking Zetia  but for some reason has stopped taking the Crestor .  I just discussed this with him and he has had no difficulty with it.  I will therefore place him back on that medication.

## 2024-07-24 ENCOUNTER — Encounter: Payer: Self-pay | Admitting: Family Medicine

## 2024-08-14 ENCOUNTER — Encounter: Admitting: Internal Medicine
